# Patient Record
Sex: Female | Born: 1960
Health system: Southern US, Community
[De-identification: ages and names within clinical notes are randomized; demographics above are authoritative.]

## PROBLEM LIST (undated history)

## (undated) DIAGNOSIS — K219 Gastro-esophageal reflux disease without esophagitis: Secondary | ICD-10-CM

## (undated) DIAGNOSIS — R0602 Shortness of breath: Secondary | ICD-10-CM

## (undated) DIAGNOSIS — R9431 Abnormal electrocardiogram [ECG] [EKG]: Secondary | ICD-10-CM

## (undated) DIAGNOSIS — M810 Age-related osteoporosis without current pathological fracture: Secondary | ICD-10-CM

## (undated) DIAGNOSIS — E288 Other ovarian dysfunction: Secondary | ICD-10-CM

## (undated) DIAGNOSIS — E2839 Other primary ovarian failure: Secondary | ICD-10-CM

## (undated) DIAGNOSIS — R5383 Other fatigue: Secondary | ICD-10-CM

## (undated) DIAGNOSIS — R87611 Atypical squamous cells cannot exclude high grade squamous intraepithelial lesion on cytologic smear of cervix (ASC-H): Secondary | ICD-10-CM

## (undated) HISTORY — DX: Other primary ovarian failure: E28.39

## (undated) HISTORY — DX: Age-related osteoporosis without current pathological fracture: M81.0

## (undated) HISTORY — DX: Gastro-esophageal reflux disease without esophagitis: K21.9

## (undated) HISTORY — DX: Other ovarian dysfunction: E28.8

## (undated) HISTORY — PX: TUBAL LIGATION: SHX77

## (undated) HISTORY — PX: OTHER SURGICAL HISTORY: SHX169

---

## 2006-01-22 ENCOUNTER — Other Ambulatory Visit: Admission: RE | Admit: 2006-01-22 | Discharge: 2006-01-22 | Payer: Self-pay | Admitting: Obstetrics and Gynecology

## 2006-01-31 ENCOUNTER — Ambulatory Visit (HOSPITAL_COMMUNITY): Admission: RE | Admit: 2006-01-31 | Discharge: 2006-01-31 | Payer: Self-pay | Admitting: Obstetrics and Gynecology

## 2006-06-28 ENCOUNTER — Emergency Department (HOSPITAL_COMMUNITY): Admission: EM | Admit: 2006-06-28 | Discharge: 2006-06-28 | Payer: Self-pay | Admitting: Emergency Medicine

## 2007-04-26 ENCOUNTER — Other Ambulatory Visit: Admission: RE | Admit: 2007-04-26 | Discharge: 2007-04-26 | Payer: Self-pay | Admitting: Obstetrics and Gynecology

## 2007-05-26 ENCOUNTER — Emergency Department (HOSPITAL_COMMUNITY): Admission: EM | Admit: 2007-05-26 | Discharge: 2007-05-26 | Payer: Self-pay | Admitting: Emergency Medicine

## 2007-07-04 ENCOUNTER — Ambulatory Visit (HOSPITAL_COMMUNITY): Admission: RE | Admit: 2007-07-04 | Discharge: 2007-07-04 | Payer: Self-pay | Admitting: Obstetrics and Gynecology

## 2008-04-27 ENCOUNTER — Other Ambulatory Visit: Admission: RE | Admit: 2008-04-27 | Discharge: 2008-04-27 | Payer: Self-pay | Admitting: Obstetrics and Gynecology

## 2009-04-28 ENCOUNTER — Ambulatory Visit (HOSPITAL_COMMUNITY): Admission: RE | Admit: 2009-04-28 | Discharge: 2009-04-28 | Payer: Self-pay | Admitting: Obstetrics and Gynecology

## 2009-04-28 ENCOUNTER — Encounter: Payer: Self-pay | Admitting: Obstetrics and Gynecology

## 2009-04-28 ENCOUNTER — Other Ambulatory Visit: Admission: RE | Admit: 2009-04-28 | Discharge: 2009-04-28 | Payer: Self-pay | Admitting: Obstetrics and Gynecology

## 2009-04-28 ENCOUNTER — Ambulatory Visit: Payer: Self-pay | Admitting: Obstetrics and Gynecology

## 2009-05-06 ENCOUNTER — Ambulatory Visit: Payer: Self-pay | Admitting: Obstetrics and Gynecology

## 2009-09-01 ENCOUNTER — Ambulatory Visit: Payer: Self-pay | Admitting: Obstetrics and Gynecology

## 2010-05-05 ENCOUNTER — Ambulatory Visit (HOSPITAL_COMMUNITY): Admission: RE | Admit: 2010-05-05 | Discharge: 2010-05-05 | Payer: Self-pay | Admitting: Obstetrics and Gynecology

## 2010-05-12 ENCOUNTER — Other Ambulatory Visit: Admission: RE | Admit: 2010-05-12 | Discharge: 2010-05-12 | Payer: Self-pay | Admitting: Obstetrics and Gynecology

## 2010-05-12 ENCOUNTER — Ambulatory Visit: Payer: Self-pay | Admitting: Obstetrics and Gynecology

## 2010-06-23 DIAGNOSIS — E782 Mixed hyperlipidemia: Secondary | ICD-10-CM | POA: Insufficient documentation

## 2010-06-23 DIAGNOSIS — R12 Heartburn: Secondary | ICD-10-CM | POA: Insufficient documentation

## 2010-06-23 DIAGNOSIS — E28319 Asymptomatic premature menopause: Secondary | ICD-10-CM | POA: Insufficient documentation

## 2010-06-23 DIAGNOSIS — R635 Abnormal weight gain: Secondary | ICD-10-CM | POA: Insufficient documentation

## 2010-06-23 DIAGNOSIS — E785 Hyperlipidemia, unspecified: Secondary | ICD-10-CM | POA: Insufficient documentation

## 2010-08-30 ENCOUNTER — Ambulatory Visit: Payer: Self-pay | Admitting: Cardiology

## 2010-11-21 ENCOUNTER — Encounter: Payer: Self-pay | Admitting: Obstetrics and Gynecology

## 2010-11-29 NOTE — Assessment & Plan Note (Signed)
Summary: np6/ lipid management. pt has state bcbs/ gd   Visit Type:  new pt visit Referring Provider:  Carmelina Peal Primary Provider:  Carmelina Peal  CC:  pt her for lipid management...sob when she runs up the steps...denies any cp or edema.  History of Present Illness: Ms Sabrina Gallagher comes in today for management of her lipids.  Her last cholesterol was 273 up from 220. Her HDL and increased also from 53-66 giving her the same cholesterol to HDL ratio. This was calculated at 4.1. Her LDL went from 157 to 163and her triglycerides increased at 219 from 52. During this time she gained about 15 pounds. She is very sedentary.  She has no other conventional risk factors. Her she's having no symptoms of chest pain, shortness of breath, or angina. She denies orthopnea PND or edema.  She simply states that she likes to eat unhealthy food such as pizza.  Current Medications (verified): 1)  None  Allergies (verified): No Known Drug Allergies  Past History:  Past Medical History: Last updated: 06/23/2010 Premature ovarian failure Weight gain Heartburn  Family History: Last updated: 08/30/2010 as per pt states there is no family h/o any health problems  Social History: Last updated: 08/30/2010 Regular Exercise - yes...3 times weekly @ [employer] Tobacco Use - No.  Alcohol Use - no  Risk Factors: Exercise: yes (06/23/2010)  Risk Factors: Smoking Status: never (06/23/2010)  Past Surgical History: pt has had no surgeries  Family History: as per pt states there is no family h/o any health problems  Social History: Regular Exercise - yes...3 times weekly @ [employer] Tobacco Use - No.  Alcohol Use - no  Review of Systems       negative other than history of present illness  Vital Signs:  Patient profile:   50 year old female Height:      66 inches Weight:      184.4 pounds BMI:     29.87 Pulse rate:   72 / minute Pulse rhythm:   irregular BP sitting:   110 / 84  (left  arm) Cuff size:   large  Vitals Entered By: Danielle Rankin, CMA (August 30, 2010 4:16 PM)  Physical Exam  General:  obese.  no acute distress Head:  normocephalic and atraumatic Eyes:  PERRLA/EOM intact; conjunctiva and lids normal. Neck:  Neck supple, no JVD. No masses, thyromegaly or abnormal cervical nodes. Chest Vinicius Brockman:  no deformities or breast masses noted Lungs:  Clear bilaterally to auscultation and percussion. Heart:  PMI nondisplaced, regular rate and rhythm, normal S1-S2, no murmur gallop or bruit Abdomen:  Bowel sounds positive; abdomen soft and non-tender without masses, organomegaly, or hernias noted. No hepatosplenomegaly. Msk:  Back normal, normal gait. Muscle strength and tone normal. Pulses:  pulses normal in all 4 extremities Extremities:  No clubbing or cyanosis. Neurologic:  Alert and oriented x 3. Skin:  Intact without lesions or rashes. Psych:  Normal affect.   EKG  Procedure date:  08/30/2010  Findings:      normal sinus rhythm, nonspecific T changes.  Impression & Recommendations:  Problem # 1:  HYPERLIPIDEMIA-MIXED (ICD-272.4) Assessment Deteriorated The change in her numbers for the worse clearly reflect weight gain. In particular concerned about her triglycerides which can be a precursor to diabetes. I strongly advised to lose 15-20 pounds and to exercise her hours per week. She will then have lipids checked at that time. Without any other risk factors, she is not in need of pharmacological therapy.  I will see her back p.r.n.  Problem # 2:  WEIGHT GAIN (ICD-783.1) Assessment: New  Patient Instructions: 1)  Your physician encouraged you to lose weight for better health.--please attempt to loose 3 pounds per week--please walk everyday

## 2011-01-29 ENCOUNTER — Inpatient Hospital Stay (INDEPENDENT_AMBULATORY_CARE_PROVIDER_SITE_OTHER)
Admission: RE | Admit: 2011-01-29 | Discharge: 2011-01-29 | Disposition: A | Discharge status: Home / Self Care | Payer: 59 | Source: Ambulatory Visit | Attending: Family Medicine | Admitting: Family Medicine

## 2011-01-29 DIAGNOSIS — M799 Soft tissue disorder, unspecified: Secondary | ICD-10-CM

## 2011-04-18 ENCOUNTER — Other Ambulatory Visit: Payer: Self-pay | Admitting: Obstetrics and Gynecology

## 2011-04-18 DIAGNOSIS — Z1231 Encounter for screening mammogram for malignant neoplasm of breast: Secondary | ICD-10-CM

## 2011-05-09 ENCOUNTER — Ambulatory Visit (HOSPITAL_COMMUNITY): Payer: 59

## 2011-05-12 ENCOUNTER — Ambulatory Visit (HOSPITAL_COMMUNITY): Payer: 59

## 2011-05-16 ENCOUNTER — Ambulatory Visit (HOSPITAL_COMMUNITY)
Admission: RE | Admit: 2011-05-16 | Discharge: 2011-05-16 | Disposition: A | Discharge status: Home / Self Care | Payer: 59 | Source: Ambulatory Visit | Attending: Obstetrics and Gynecology | Admitting: Obstetrics and Gynecology

## 2011-05-16 DIAGNOSIS — Z1231 Encounter for screening mammogram for malignant neoplasm of breast: Secondary | ICD-10-CM | POA: Insufficient documentation

## 2011-08-04 ENCOUNTER — Inpatient Hospital Stay (INDEPENDENT_AMBULATORY_CARE_PROVIDER_SITE_OTHER)
Admission: RE | Admit: 2011-08-04 | Discharge: 2011-08-04 | Disposition: A | Discharge status: Home / Self Care | Payer: 59 | Source: Ambulatory Visit | Attending: Emergency Medicine | Admitting: Emergency Medicine

## 2011-08-04 DIAGNOSIS — J029 Acute pharyngitis, unspecified: Secondary | ICD-10-CM

## 2011-08-04 DIAGNOSIS — R198 Other specified symptoms and signs involving the digestive system and abdomen: Secondary | ICD-10-CM

## 2011-08-14 LAB — POCT CARDIAC MARKERS
Myoglobin, poc: 39.5
Operator id: 192351
Operator id: 192351
Troponin i, poc: <0.05

## 2011-08-14 LAB — DIFFERENTIAL
Basophils Absolute: 0
Basophils Relative: 1
Eosinophils Relative: 2
Lymphs Abs: 2.7
Monocytes Absolute: 0.6
Monocytes Relative: 9
Neutrophils Relative %: 42 — ABNORMAL LOW

## 2011-08-14 LAB — I-STAT 8, (EC8 V) (CONVERTED LAB)
Acid-Base Excess: 1
BUN: 10
Bicarbonate: 27.6 — ABNORMAL HIGH
HCT: 39
Hemoglobin: 13.3
TCO2: 29
pCO2, Ven: 48.7
pH, Ven: 7.362 — ABNORMAL HIGH

## 2011-08-14 LAB — CBC
Hemoglobin: 12
Platelets: 280
RBC: 4.23

## 2011-08-14 LAB — POCT I-STAT CREATININE: Operator id: 192351

## 2011-08-14 LAB — D-DIMER, QUANTITATIVE: D-Dimer, Quant: 0.26

## 2011-11-02 ENCOUNTER — Emergency Department (HOSPITAL_COMMUNITY)
Admission: EM | Admit: 2011-11-02 | Discharge: 2011-11-02 | Disposition: A | Discharge status: Home / Self Care | Payer: 59 | Source: Home / Self Care | Attending: Family Medicine | Admitting: Family Medicine

## 2011-11-02 DIAGNOSIS — J069 Acute upper respiratory infection, unspecified: Secondary | ICD-10-CM

## 2011-11-02 MED ORDER — IPRATROPIUM BROMIDE 0.06 % NA SOLN: 2.0000 | Freq: Four times a day (QID) | NASAL | Status: DC

## 2011-11-02 MED ORDER — AZITHROMYCIN 250 MG PO TABS: ORAL_TABLET | ORAL | Status: AC

## 2011-11-02 MED ORDER — PSEUDOEPHEDRINE-GUAIFENESIN ER 120-1200 MG PO TB12: 120.0000 mg | ORAL_TABLET | Freq: Two times a day (BID) | ORAL | Status: DC

## 2011-11-02 NOTE — ED Provider Notes (Signed)
History     CSN: [id number]  Arrival date & time 11/02/11  1758   First MD Initiated Contact with Patient 11/02/11 1812      No chief complaint on file.   (Consider location/radiation/quality/duration/timing/severity/associated sxs/prior treatment) Patient is a 51 y.o. female presenting with URI. The history is provided by the patient.  URI The primary symptoms include ear pain, sore throat and cough. Primary symptoms do not include fever, nausea or vomiting. The current episode started 3 to 5 days ago. This is a new problem. The problem has not changed since onset. Symptoms associated with the illness include sinus pressure, congestion and rhinorrhea.    No past medical history on file.  No past surgical history on file.  No family history on file.  History  Substance Use Topics  . Smoking status: Not on file  . Smokeless tobacco: Not on file  . Alcohol Use: Not on file    OB History    No data available      Review of Systems  Constitutional: Negative for fever.  HENT: Positive for ear pain, congestion, sore throat, rhinorrhea and sinus pressure.   Respiratory: Positive for cough.   Gastrointestinal: Negative for nausea and vomiting.    Allergies  Review of patient's allergies indicates not on file.  Home Medications   Current Outpatient Rx  Name Route Sig Dispense Refill  . AZITHROMYCIN 250 MG PO TABS  Take as directed on pack 6 each 0  . IPRATROPIUM BROMIDE 0.06 % NA SOLN Nasal Place 2 sprays into the nose 4 (four) times daily. 15 mL 12  . PSEUDOEPHEDRINE-GUAIFENESIN ER [phone] MG PO TB12 Oral Take 120-1,200 mg by mouth 2 (two) times daily. 30 each 0    BP 146/82  Pulse 89  Temp(Src) 99.2 F (37.3 C) (Oral)  Resp 16  SpO2 100%  Physical Exam  Nursing note and vitals reviewed. Constitutional: She is oriented to person, place, and time. She appears well-developed and well-nourished.  HENT:  Head: Normocephalic.  Right Ear: External ear normal.    Left Ear: External ear normal.  Mouth/Throat: Oropharynx is clear and moist.  Eyes: Conjunctivae are normal. Pupils are equal, round, and reactive to light.  Neck: Normal range of motion. Neck supple.  Cardiovascular: Normal rate, regular rhythm, normal heart sounds and intact distal pulses.   Pulmonary/Chest: Effort normal and breath sounds normal.  Lymphadenopathy:    She has no cervical adenopathy.  Neurological: She is alert and oriented to person, place, and time.  Skin: Skin is warm and dry.    ED Course  Procedures (including critical care time)  Labs Reviewed - No data to display No results found.   1. URI (upper respiratory infection)       MDM          Barkley Bruns, MD 11/02/11 2000

## 2011-11-04 ENCOUNTER — Encounter: Payer: Self-pay | Admitting: Emergency Medicine

## 2012-05-16 ENCOUNTER — Other Ambulatory Visit: Payer: Self-pay | Admitting: Obstetrics and Gynecology

## 2012-05-16 DIAGNOSIS — Z1231 Encounter for screening mammogram for malignant neoplasm of breast: Secondary | ICD-10-CM

## 2012-05-27 ENCOUNTER — Emergency Department (HOSPITAL_COMMUNITY): Payer: 59

## 2012-05-27 ENCOUNTER — Encounter (HOSPITAL_COMMUNITY): Payer: Self-pay | Admitting: Emergency Medicine

## 2012-05-27 ENCOUNTER — Emergency Department (HOSPITAL_COMMUNITY)
Admission: EM | Admit: 2012-05-27 | Discharge: 2012-05-27 | Disposition: A | Discharge status: Home / Self Care | Payer: 59 | Attending: Emergency Medicine | Admitting: Emergency Medicine

## 2012-05-27 DIAGNOSIS — R0602 Shortness of breath: Secondary | ICD-10-CM | POA: Insufficient documentation

## 2012-05-27 DIAGNOSIS — K219 Gastro-esophageal reflux disease without esophagitis: Secondary | ICD-10-CM | POA: Insufficient documentation

## 2012-05-27 DIAGNOSIS — R0789 Other chest pain: Secondary | ICD-10-CM

## 2012-05-27 LAB — POCT I-STAT TROPONIN I

## 2012-05-27 LAB — BASIC METABOLIC PANEL
BUN: 10 mg/dL (ref 6–23)
CO2: 24 mEq/L (ref 19–32)
Calcium: 9.3 mg/dL (ref 8.4–10.5)
Creatinine, Ser: 0.65 mg/dL (ref 0.50–1.10)
Glucose, Bld: 136 mg/dL — ABNORMAL HIGH (ref 70–99)
Potassium: 3.6 mEq/L (ref 3.5–5.1)
Sodium: 141 mEq/L (ref 135–145)

## 2012-05-27 LAB — CBC
Platelets: 268 10*3/uL (ref 150–400)
RDW: 13.8 % (ref 11.5–15.5)

## 2012-05-27 LAB — HEPATIC FUNCTION PANEL
Alkaline Phosphatase: 74 U/L (ref 39–117)
Bilirubin, Direct: <0.1 mg/dL (ref 0.0–0.3)
Total Bilirubin: 0.1 mg/dL — ABNORMAL LOW (ref 0.3–1.2)
Total Protein: 7.6 g/dL (ref 6.0–8.3)

## 2012-05-27 LAB — D-DIMER, QUANTITATIVE: D-Dimer, Quant: 0.28 ug/mL-FEU (ref 0.00–0.48)

## 2012-05-27 MED ORDER — LANSOPRAZOLE 30 MG PO CPDR: 30.0000 mg | DELAYED_RELEASE_CAPSULE | Freq: Every day | ORAL | Status: DC

## 2012-05-27 MED ORDER — GI COCKTAIL ~~LOC~~
30.0000 mL | Freq: Once | ORAL | Status: AC
  Administered 2012-05-27: 30 mL via ORAL
  Filled 2012-05-27: qty 30

## 2012-05-27 NOTE — ED Notes (Signed)
Pt states she woke up gagging approx 30 min ago.  C/o pain to center of chest with sob, dizziness, diaphoresis, and nausea.

## 2012-05-27 NOTE — ED Notes (Signed)
Old and new EKG given to DR [facility]

## 2012-05-28 NOTE — ED Provider Notes (Signed)
History     CSN: [id number]  Arrival date & time 05/27/12  0205   First MD Initiated Contact with Patient 05/27/12 0405      Chief Complaint  Patient presents with  . Chest Pain  . Shortness of Breath    (Consider location/radiation/quality/duration/timing/severity/associated sxs/prior treatment) HPI 51 year old female presents emergency department with complaint of waking gagging on saliva, shortness of breath and pressure/burning pain to the center of her chest. Patient reports she had nausea with this. She denies previous history of GERD, reflux heartburn, PND orthopnea. Patient denies any medical problems, no family history of coronary disease. Patient felt well before going to bed. Patient describes tightness and as a band across her chest and up the center. Patient took some peppermint tea which has not helped her symptoms. She denies any leg swelling, no history of blood clots, not on Birth control  History reviewed. No pertinent past medical history.  History reviewed. No pertinent past surgical history.  No family history on file.  History  Substance Use Topics  . Smoking status: Never Smoker   . Smokeless tobacco: Not on file  . Alcohol Use: No    OB History    Grav Para Term Preterm Abortions TAB SAB Ect Mult Living                  Review of Systems  All other systems reviewed and are negative.   other listed in history of present illness  Allergies  Review of patient's allergies indicates no known allergies.  Home Medications   Current Outpatient Rx  Name Route Sig Dispense Refill  . LANSOPRAZOLE 30 MG PO CPDR Oral Take 1 capsule (30 mg total) by mouth daily. 30 capsule 0    BP 116/64  Pulse 79  Temp 98.3 F (36.8 C) (Oral)  Resp 18  SpO2 99%  Physical Exam  Nursing note and vitals reviewed. Constitutional: She is oriented to person, place, and time. She appears well-developed and well-nourished.  HENT:  Head: Normocephalic and atraumatic.    Right Ear: External ear normal.  Left Ear: External ear normal.  Nose: Nose normal.  Mouth/Throat: Oropharynx is clear and moist.  Eyes: Conjunctivae and EOM are normal. Pupils are equal, round, and reactive to light.  Neck: Normal range of motion. Neck supple. No JVD present. No tracheal deviation present. No thyromegaly present.  Cardiovascular: Normal rate, regular rhythm, normal heart sounds and intact distal pulses.  Exam reveals no gallop and no friction rub.   No murmur heard. Pulmonary/Chest: Effort normal and breath sounds normal. No stridor. No respiratory distress. She has no wheezes. She has no rales. She exhibits no tenderness.  Abdominal: Soft. Bowel sounds are normal. She exhibits no distension and no mass. There is no tenderness. There is no rebound and no guarding.  Musculoskeletal: Normal range of motion. She exhibits no edema and no tenderness.  Lymphadenopathy:    She has no cervical adenopathy.  Neurological: She is oriented to person, place, and time. She has normal reflexes. No cranial nerve deficit. She exhibits normal muscle tone. Coordination normal.  Skin: Skin is dry. No rash noted. No erythema. No pallor.  Psychiatric: She has a normal mood and affect. Her behavior is normal. Judgment and thought content normal.    ED Course  Procedures (including critical care time)  Labs Reviewed  BASIC METABOLIC PANEL - Abnormal; Notable for the following:    Glucose, Bld 136 (*)     All other components within  normal limits  HEPATIC FUNCTION PANEL - Abnormal; Notable for the following:    Total Bilirubin 0.1 (*)     All other components within normal limits  CARDIAC PANEL(CRET KIN+CKTOT+MB+TROPI) - Abnormal; Notable for the following:    Total CK 210 (*)     All other components within normal limits  CBC  POCT I-STAT TROPONIN I  LIPASE, BLOOD  D-DIMER, QUANTITATIVE   Dg Chest 2 View  05/27/2012  *RADIOLOGY REPORT*  Clinical Data: Chest pain, shortness of breath.   CHEST - 2 VIEW  Comparison: 05/26/2007  Findings: Cardiomediastinal contours are stable, within normal range.  Lungs are essentially clear.  No focal consolidation, pleural effusion, or pneumothorax.  No acute osseous finding.  IMPRESSION: No radiographic evidence of acute cardiopulmonary process.  Original Report Authenticated By: Waneta Martins, M.D.    Date: 05/27/2012  Rate: 72  Rhythm: normal sinus rhythm  QRS Axis: normal  Intervals: normal  ST/T Wave abnormalities: nonspecific T wave changes  Conduction Disutrbances:none  Narrative Interpretation:   Old EKG Reviewed: none available   1. Atypical chest pain   2. GERD (gastroesophageal reflux disease)       MDM  29-year-old female with atypical chest pain. Complete resolution after GI cocktail, EKG with some T-wave flattening otherwise unremarkable very slight elevation in total CK but negative CK and troponin as well as d-dimer. Will start the patient on PPI and have her followup with private care provider        Olivia Mackie, MD 05/28/12 [phone]

## 2012-05-31 ENCOUNTER — Encounter: Payer: Self-pay | Admitting: Gynecology

## 2012-05-31 DIAGNOSIS — E2839 Other primary ovarian failure: Secondary | ICD-10-CM | POA: Insufficient documentation

## 2012-06-06 ENCOUNTER — Ambulatory Visit (HOSPITAL_COMMUNITY)
Admission: RE | Admit: 2012-06-06 | Discharge: 2012-06-06 | Disposition: A | Discharge status: Home / Self Care | Payer: 59 | Source: Ambulatory Visit | Attending: Obstetrics and Gynecology | Admitting: Obstetrics and Gynecology

## 2012-06-06 DIAGNOSIS — Z1231 Encounter for screening mammogram for malignant neoplasm of breast: Secondary | ICD-10-CM | POA: Insufficient documentation

## 2012-06-11 ENCOUNTER — Encounter: Payer: Self-pay | Admitting: Obstetrics and Gynecology

## 2012-06-11 ENCOUNTER — Ambulatory Visit (INDEPENDENT_AMBULATORY_CARE_PROVIDER_SITE_OTHER): Payer: 59 | Admitting: Obstetrics and Gynecology

## 2012-06-11 ENCOUNTER — Other Ambulatory Visit (HOSPITAL_COMMUNITY)
Admission: RE | Admit: 2012-06-11 | Discharge: 2012-06-11 | Disposition: A | Discharge status: Home / Self Care | Payer: 59 | Source: Ambulatory Visit | Attending: Obstetrics and Gynecology | Admitting: Obstetrics and Gynecology

## 2012-06-11 VITALS — BP 124/80 | Ht 64.0 in | Wt 192.0 lb

## 2012-06-11 DIAGNOSIS — Z01419 Encounter for gynecological examination (general) (routine) without abnormal findings: Secondary | ICD-10-CM

## 2012-06-11 DIAGNOSIS — IMO0001 Reserved for inherently not codable concepts without codable children: Secondary | ICD-10-CM | POA: Insufficient documentation

## 2012-06-11 LAB — CBC WITH DIFFERENTIAL/PLATELET
Basophils Relative: 1 % (ref 0–1)
Eosinophils Absolute: 0.1 10*3/uL (ref 0.0–0.7)
HCT: 38.4 % (ref 36.0–46.0)
Hemoglobin: 12.3 g/dL (ref 12.0–15.0)
Lymphs Abs: 2.9 10*3/uL (ref 0.7–4.0)
MCH: 27.3 pg (ref 26.0–34.0)
MCHC: 32 g/dL (ref 30.0–36.0)
MCV: 85.3 fL (ref 78.0–100.0)
Monocytes Absolute: 0.4 10*3/uL (ref 0.1–1.0)
Monocytes Relative: 7 % (ref 3–12)

## 2012-06-11 NOTE — Progress Notes (Signed)
Patient came to see me today for her annual GYN exam. She is gone through premature ovarian failure. She is having no vaginal bleeding or pelvic pain. She is up-to-date on mammograms. She has always had normal Pap smears. Her last Pap smear was 2011. She's never had a bone density. She has lost 2 inches of height over a period of several years. She has had no fractures. She is not currently sexually active. She is looking for a PCP. She's never had a colonoscopy and her father had colon cancer.she is doing well menopausally without HRT.  Physical examination:kim gardner  present. HEENT within normal limits. Neck: Thyroid not large. No masses. Supraclavicular nodes: not enlarged. Breasts: Examined in both sitting and lying  position. No skin changes and no masses. Abdomen: Soft no guarding rebound or masses or hernia. Pelvic: External: Within normal limits. BUS: Within normal limits. Vaginal:within normal limits. Good estrogen effect. No evidence of cystocele rectocele or enterocele. Cervix: clean. Uterus: Normal size and shape. Adnexa: No masses. Rectovaginal exam: Confirmatory and negative. Extremities: Within normal limits.  Assessment: #1. Premature ovary failure #2. Height loss  Plan: Continue yearly mammograms. Schedule bone density. Schedule colonoscopy. The new Pap smear guidelines were discussed with the patient. Pap done.

## 2012-06-11 NOTE — Patient Instructions (Signed)
Schedule bone density. Schedule colonoscopy with Dr. Marina Goodell.

## 2012-06-12 ENCOUNTER — Other Ambulatory Visit: Payer: Self-pay | Admitting: Obstetrics and Gynecology

## 2012-06-12 DIAGNOSIS — E7800 Pure hypercholesterolemia, unspecified: Secondary | ICD-10-CM

## 2012-06-12 LAB — URINALYSIS W MICROSCOPIC + REFLEX CULTURE
Bilirubin Urine: NEGATIVE
Ketones, ur: NEGATIVE mg/dL
Leukocytes, UA: NEGATIVE
Specific Gravity, Urine: <1.005 — ABNORMAL LOW (ref 1.005–1.030)
pH: 6.5 (ref 5.0–8.0)

## 2012-07-04 ENCOUNTER — Other Ambulatory Visit: Payer: 59

## 2012-07-04 DIAGNOSIS — E7800 Pure hypercholesterolemia, unspecified: Secondary | ICD-10-CM

## 2012-07-04 LAB — LIPID PANEL
Cholesterol: 217 mg/dL — ABNORMAL HIGH (ref 0–200)
Triglycerides: 89 mg/dL (ref <150)
VLDL: 18 mg/dL (ref 0–40)

## 2012-07-08 ENCOUNTER — Other Ambulatory Visit: Payer: Self-pay | Admitting: Obstetrics and Gynecology

## 2012-07-08 DIAGNOSIS — E7800 Pure hypercholesterolemia, unspecified: Secondary | ICD-10-CM

## 2012-10-11 ENCOUNTER — Encounter (HOSPITAL_COMMUNITY): Payer: Self-pay | Admitting: Emergency Medicine

## 2012-10-11 ENCOUNTER — Emergency Department (HOSPITAL_COMMUNITY)
Admission: EM | Admit: 2012-10-11 | Discharge: 2012-10-11 | Disposition: A | Discharge status: Home / Self Care | Payer: 59 | Source: Home / Self Care

## 2012-10-11 DIAGNOSIS — K219 Gastro-esophageal reflux disease without esophagitis: Secondary | ICD-10-CM

## 2012-10-11 DIAGNOSIS — K209 Esophagitis, unspecified without bleeding: Secondary | ICD-10-CM

## 2012-10-11 NOTE — ED Provider Notes (Signed)
History     CSN: [id number]  Arrival date & time 10/11/12  1003   First MD Initiated Contact with Patient 10/11/12 1019      Chief Complaint  Patient presents with  . Gastrophageal Reflux    (Consider location/radiation/quality/duration/timing/severity/associated sxs/prior treatment) HPI Comments: History is from the patient. She has a chief complaint that 2 feels like it stuck in her esophagus. There is discomfort from her throat down to the epigastrium which began abruptly 3 days ago although she has a remote history of GERD. She states that sometimes there is a problem with swallowing but she is able to get foods and liquids down. She is able to hold her own  secretions without regurgitation or vomiting. She describes belching and a discomfort in the epigastrium that often radiates upward into the throat. There has been no vomiting or inability to swallow but there has been some discomfort with swallowing food.   Past Medical History  Diagnosis Date  . Premature ovarian failure   . Reflux     History reviewed. No pertinent past surgical history.  Family History  Problem Relation Age of Onset  . Colon cancer Father     History  Substance Use Topics  . Smoking status: Never Smoker   . Smokeless tobacco: Not on file  . Alcohol Use: No    OB History    Grav Para Term Preterm Abortions TAB SAB Ect Mult Living   3 2 2  1     2       Review of Systems  Constitutional: Negative.   HENT: Negative.   Respiratory: Negative.   Cardiovascular: Negative.   Gastrointestinal:       See history of present illness  Genitourinary: Negative.   Neurological: Negative.   Psychiatric/Behavioral: Negative.   All other systems reviewed and are negative.    Allergies  Review of patient's allergies indicates no known allergies.  Home Medications   Current Outpatient Rx  Name  Route  Sig  Dispense  Refill  . LANSOPRAZOLE 30 MG PO CPDR   Oral   Take 1 capsule (30 mg total) by  mouth daily.   30 capsule   0     BP 139/86  Pulse 73  Temp 97.2 F (36.2 C) (Oral)  Resp 18  SpO2 95%  Physical Exam  Constitutional: She is oriented to person, place, and time. She appears well-developed and well-nourished. No distress.  HENT:  Head: Normocephalic and atraumatic.  Mouth/Throat: Oropharynx is clear and moist. No oropharyngeal exudate.  Eyes: EOM are normal. Pupils are equal, round, and reactive to light.  Neck: Normal range of motion. Neck supple.  Cardiovascular: Normal rate and normal heart sounds.   Pulmonary/Chest: Effort normal and breath sounds normal. No respiratory distress.  Abdominal: Soft. She exhibits no mass. There is no tenderness. There is no rebound and no guarding.  Musculoskeletal: Normal range of motion.  Neurological: She is alert and oriented to person, place, and time. No cranial nerve deficit.  Skin: Skin is warm and dry.  Psychiatric: She has a normal mood and affect.    ED Course  Procedures (including critical care time)  Labs Reviewed - No data to display No results found.   1. GERD (gastroesophageal reflux disease)   2. Esophagitis       MDM  Prevacid 30 mg daily. She has this at home Soft mechanical diet and avoid acidic, spicy and rough-type foods. Eats small amounts at a time and to  well. Instructions on diet for GERD given. Followup with your PCP and will probably need a referral to a gastroenterologist. For any inability to swallow or increased pain in any to go to the emergency department.         Hayden Rasmussen, NP 10/11/12 1041

## 2012-10-11 NOTE — ED Notes (Signed)
Pt c/o indigestion problems x2 days... Feels like her food is still in the esophagus... Was Rx acid reflux medication but not taking it... Will occasionally have heartburns and frequent burping... She is alert w/no signs of acute distress.

## 2012-10-12 NOTE — ED Provider Notes (Signed)
Medical screening examination/treatment/procedure(s) were performed by non-physician practitioner and as supervising physician I was immediately available for consultation/collaboration.   MORENO-COLL,Dossie Ocanas; MD   Ahmad Vanwey Moreno-Coll, MD 10/12/12 0822 

## 2013-05-26 ENCOUNTER — Other Ambulatory Visit: Payer: Self-pay | Admitting: Gynecology

## 2013-05-26 DIAGNOSIS — Z1231 Encounter for screening mammogram for malignant neoplasm of breast: Secondary | ICD-10-CM

## 2013-06-19 ENCOUNTER — Other Ambulatory Visit: Payer: Self-pay | Admitting: Gynecology

## 2013-06-19 ENCOUNTER — Ambulatory Visit (HOSPITAL_COMMUNITY)
Admission: RE | Admit: 2013-06-19 | Discharge: 2013-06-19 | Disposition: A | Discharge status: Home / Self Care | Payer: 59 | Source: Ambulatory Visit | Attending: Gynecology | Admitting: Gynecology

## 2013-06-19 DIAGNOSIS — Z1231 Encounter for screening mammogram for malignant neoplasm of breast: Secondary | ICD-10-CM | POA: Insufficient documentation

## 2013-06-26 ENCOUNTER — Ambulatory Visit (INDEPENDENT_AMBULATORY_CARE_PROVIDER_SITE_OTHER): Payer: 59 | Admitting: Gynecology

## 2013-06-26 ENCOUNTER — Encounter: Payer: Self-pay | Admitting: Gynecology

## 2013-06-26 VITALS — BP 120/76 | Ht 65.0 in | Wt 200.0 lb

## 2013-06-26 DIAGNOSIS — Z13220 Encounter for screening for lipoid disorders: Secondary | ICD-10-CM

## 2013-06-26 DIAGNOSIS — Z01419 Encounter for gynecological examination (general) (routine) without abnormal findings: Secondary | ICD-10-CM

## 2013-06-26 DIAGNOSIS — E28319 Asymptomatic premature menopause: Secondary | ICD-10-CM

## 2013-06-26 NOTE — Progress Notes (Signed)
Sabrina Gallagher [birth year] [id number]        52 y.o.  [id number] for annual exam.  Doing well, former patient of Dr. Eda Paschal.  Past medical history,surgical history, medications, allergies, family history and social history were all reviewed and documented in the EPIC chart.  ROS:  Performed and pertinent positives and negatives are included in the history, assessment and plan .  Exam: Kim assistant Filed Vitals:   06/26/13 1604  BP: 120/76  Height: 5\' 5"  (1.651 m)  Weight: 200 lb (90.719 kg)   General appearance  Normal Skin grossly normal Head/Neck normal with no cervical or supraclavicular adenopathy thyroid normal Lungs  clear Cardiac RR, without RMG Abdominal  soft, nontender, without masses, organomegaly or hernia Breasts  examined lying and sitting without masses, retractions, discharge or axillary adenopathy. Pelvic  Ext/BUS/vagina  with first degree cystocele. Uterus well supported. No evidence of rectocele.  Cervix  normal  Uterus  anteverted, normal size, shape and contour, midline and mobile nontender   Adnexa  Without masses or tenderness    Anus and perineum  normal   Rectovaginal  normal sphincter tone without palpated masses or tenderness.    Assessment/Plan:  52 y.o. [id number] female for annual exam.   1. Premature menopause age 59. No bleeding. No significant hot flushes, night sweats, vaginal dryness or dyspareunia. Had transiently tried HRT but could not tolerate. Recommend baseline DEXA now given the number of years of hypoestrogenism. Increase calcium and vitamin D also reviewed. Patient knows to report any bleeding. 2. Pap smear 05/2012. No Pap smear done today. No history of abnormal Pap smears previously. Plan repeat Pap smear in 3 year interval. 3. Mammography 05/2013. Continue with annual mammography. SBE monthly reviewed. 4. Colonoscopy never. Strongly recommended she schedule colonoscopy. Father with history of colon cancer. Patient agrees to  schedule. 5. Health maintenance. Baseline CBC comprehensive metabolic panel lipid profile urinalysis TSH vitamin D ordered. Follow up for bone density otherwise annually.  Note: This document was prepared with digital dictation and possible smart phrase technology. Any transcriptional errors that result from this process are unintentional.   Dara Lords MD, 4:32 PM 06/26/2013

## 2013-06-26 NOTE — Patient Instructions (Addendum)
Schedule colonoscopy with [place] at [phone] or [facility] at [phone]  Schedule bone density and followup for this.  Followup for annual exam in one year.

## 2013-06-27 LAB — LIPID PANEL
HDL: 56 mg/dL (ref >39)
LDL Cholesterol: 153 mg/dL — ABNORMAL HIGH (ref 0–99)
Triglycerides: 99 mg/dL (ref <150)
VLDL: 20 mg/dL (ref 0–40)

## 2013-06-27 LAB — COMPREHENSIVE METABOLIC PANEL
Alkaline Phosphatase: 78 U/L (ref 39–117)
BUN: 7 mg/dL (ref 6–23)
Glucose, Bld: 84 mg/dL (ref 70–99)
Total Bilirubin: 0.5 mg/dL (ref 0.3–1.2)

## 2013-06-27 LAB — CBC WITH DIFFERENTIAL/PLATELET
Basophils Relative: 1 % (ref 0–1)
Eosinophils Absolute: 0.1 10*3/uL (ref 0.0–0.7)
HCT: 37.9 % (ref 36.0–46.0)
Hemoglobin: 12.6 g/dL (ref 12.0–15.0)
MCH: 27.9 pg (ref 26.0–34.0)
MCHC: 33.2 g/dL (ref 30.0–36.0)
Monocytes Absolute: 0.4 10*3/uL (ref 0.1–1.0)
Monocytes Relative: 7 % (ref 3–12)

## 2013-06-27 LAB — URINALYSIS W MICROSCOPIC + REFLEX CULTURE
Bacteria, UA: NONE SEEN
Bilirubin Urine: NEGATIVE
Crystals: NONE SEEN
Ketones, ur: NEGATIVE mg/dL
Protein, ur: NEGATIVE mg/dL
Specific Gravity, Urine: 1.011 (ref 1.005–1.030)
Urobilinogen, UA: 0.2 mg/dL (ref 0.0–1.0)

## 2013-07-09 ENCOUNTER — Other Ambulatory Visit: Payer: Self-pay

## 2013-07-09 DIAGNOSIS — E559 Vitamin D deficiency, unspecified: Secondary | ICD-10-CM

## 2013-07-09 DIAGNOSIS — E7800 Pure hypercholesterolemia, unspecified: Secondary | ICD-10-CM

## 2013-07-30 DIAGNOSIS — M810 Age-related osteoporosis without current pathological fracture: Secondary | ICD-10-CM

## 2013-07-30 HISTORY — DX: Age-related osteoporosis without current pathological fracture: M81.0

## 2013-08-21 ENCOUNTER — Ambulatory Visit (INDEPENDENT_AMBULATORY_CARE_PROVIDER_SITE_OTHER): Payer: 59

## 2013-08-21 ENCOUNTER — Telehealth: Payer: Self-pay | Admitting: Gynecology

## 2013-08-21 DIAGNOSIS — M810 Age-related osteoporosis without current pathological fracture: Secondary | ICD-10-CM

## 2013-08-21 DIAGNOSIS — E28319 Asymptomatic premature menopause: Secondary | ICD-10-CM

## 2013-08-21 NOTE — Telephone Encounter (Signed)
Left message for pt to call.

## 2013-08-21 NOTE — Telephone Encounter (Signed)
Tell patient that her bone density does show osteoporosis. Recommend office visit to discuss. Would like her also to have vitamin D and PTH level before visit.

## 2013-08-22 ENCOUNTER — Other Ambulatory Visit: Payer: 59

## 2013-08-22 DIAGNOSIS — M810 Age-related osteoporosis without current pathological fracture: Secondary | ICD-10-CM

## 2013-08-22 NOTE — Telephone Encounter (Signed)
Pt informed with the below note, orders place, pt will make appointment.

## 2013-08-25 LAB — PTH, INTACT AND CALCIUM: Calcium: 9.5 mg/dL (ref 8.4–10.5)

## 2013-08-28 ENCOUNTER — Emergency Department (HOSPITAL_COMMUNITY)
Admission: EM | Admit: 2013-08-28 | Discharge: 2013-08-28 | Disposition: A | Discharge status: Home / Self Care | Payer: 59 | Source: Home / Self Care | Attending: Family Medicine | Admitting: Family Medicine

## 2013-08-28 ENCOUNTER — Encounter (HOSPITAL_COMMUNITY): Payer: Self-pay | Admitting: Emergency Medicine

## 2013-08-28 DIAGNOSIS — J302 Other seasonal allergic rhinitis: Secondary | ICD-10-CM

## 2013-08-28 DIAGNOSIS — J309 Allergic rhinitis, unspecified: Secondary | ICD-10-CM

## 2013-08-28 MED ORDER — IPRATROPIUM BROMIDE 0.06 % NA SOLN: 2.0000 | Freq: Four times a day (QID) | NASAL | Status: DC

## 2013-08-28 MED ORDER — METHYLPREDNISOLONE ACETATE 40 MG/ML IJ SUSP: 80.0000 mg | Freq: Once | INTRAMUSCULAR | Status: DC

## 2013-08-28 MED ORDER — HYDROCOD POLST-CHLORPHEN POLST 10-8 MG/5ML PO LQCR: 5.0000 mL | Freq: Two times a day (BID) | ORAL | Status: DC | PRN

## 2013-08-28 MED ORDER — TRIAMCINOLONE ACETONIDE 40 MG/ML IJ SUSP: 40.0000 mg | Freq: Once | INTRAMUSCULAR | Status: DC

## 2013-08-28 MED ORDER — METHYLPREDNISOLONE ACETATE 80 MG/ML IJ SUSP
INTRAMUSCULAR | Status: AC
  Filled 2013-08-28: qty 1

## 2013-08-28 MED ORDER — TRIAMCINOLONE ACETONIDE 40 MG/ML IJ SUSP
INTRAMUSCULAR | Status: AC
  Filled 2013-08-28: qty 1

## 2013-08-28 NOTE — ED Notes (Signed)
Pt  Reports  Symptoms  Of  Headache       Sinus  Congestion          Body  Aches     X  2  Days       Had  sorethroat earlier -  That is  Better  Now        She  Is  Sitting upright on  Exam table  Speaking in complete  sentances

## 2013-08-28 NOTE — ED Provider Notes (Signed)
CSN: [id number]     Arrival date & time 08/28/13  1415 History   First MD Initiated Contact with Patient 08/28/13 1626     Chief Complaint  Patient presents with  . Headache   (Consider location/radiation/quality/duration/timing/severity/associated sxs/prior Treatment) Patient is a 52 y.o. female presenting with headaches. The history is provided by the patient.  Headache Pain location:  Frontal Quality:  Dull Radiates to:  Does not radiate Onset quality:  Gradual Duration:  2 days Progression:  Unchanged Chronicity:  New Similar to prior headaches: no   Context: coughing   Associated symptoms: congestion, cough, drainage and sinus pressure   Associated symptoms: no fever     Past Medical History  Diagnosis Date  . Reflux   . Premature ovarian failure age 28   History reviewed. No pertinent past surgical history. Family History  Problem Relation Age of Onset  . Colon cancer Father    History  Substance Use Topics  . Smoking status: Never Smoker   . Smokeless tobacco: Not on file  . Alcohol Use: No   OB History   Grav Para Term Preterm Abortions TAB SAB Ect Mult Living   3 2 2  1     2      Review of Systems  Constitutional: Negative.  Negative for fever.  HENT: Positive for congestion, postnasal drip, rhinorrhea and sinus pressure.   Respiratory: Positive for cough.   Neurological: Positive for headaches.    Allergies  Review of patient's allergies indicates no known allergies.  Home Medications   Current Outpatient Rx  Name  Route  Sig  Dispense  Refill  . chlorpheniramine-HYDROcodone (TUSSIONEX PENNKINETIC ER) 10-8 MG/5ML LQCR   Oral   Take 5 mLs by mouth every 12 (twelve) hours as needed.   115 mL   1   . ipratropium (ATROVENT) 0.06 % nasal spray   Nasal   Place 2 sprays into the nose 4 (four) times daily.   15 mL   1    BP 153/88  Pulse 86  Temp(Src) 98.8 F (37.1 C) (Oral)  Resp 14  SpO2 99% Physical Exam  Nursing note and vitals  reviewed. Constitutional: She is oriented to person, place, and time. She appears well-developed and well-nourished. No distress.  HENT:  Head: Normocephalic.  Right Ear: External ear normal.  Left Ear: External ear normal.  Nose: Mucosal edema and rhinorrhea present.  Mouth/Throat: Oropharynx is clear and moist.  Neck: Normal range of motion. Neck supple.  Pulmonary/Chest: Effort normal and breath sounds normal.  Lymphadenopathy:    She has no cervical adenopathy.  Neurological: She is alert and oriented to person, place, and time.  Skin: Skin is warm and dry.    ED Course  Procedures (including critical care time) Labs Review Labs Reviewed - No data to display Imaging Review No results found.    MDM      Linna Hoff, MD 08/28/13 [phone]

## 2013-08-28 NOTE — ED Notes (Signed)
Pt  Refused   Shots   Dr Artis Flock  In   [place]  With  Pt         Pt  Still  Refused  meds  Discarded      As  They  Where  Already  Drawn  up

## 2013-08-29 ENCOUNTER — Encounter: Payer: Self-pay | Admitting: Gynecology

## 2013-08-29 ENCOUNTER — Ambulatory Visit (INDEPENDENT_AMBULATORY_CARE_PROVIDER_SITE_OTHER): Payer: 59 | Admitting: Gynecology

## 2013-08-29 DIAGNOSIS — M810 Age-related osteoporosis without current pathological fracture: Secondary | ICD-10-CM

## 2013-08-29 NOTE — Progress Notes (Signed)
Patient presents to review her most recent and only DEXA 07/2013 showing T score of -2.9 at the AP spine. Secondary workup to include TSH PTH calcium and vitamin D normal at 41. Significant history to include premature menopause at age 51 without HRT any prior vitamin D of 14 in August 2014.  I reviewed the issue of osteoporosis and fracture risk. I am sure in this patient's case is due to her premature menopause at age 72 without significant HRT. She was vitamin D deficient and now is taking 2000 units daily with a normal vitamin D level. I reviewed the risks of progression of her osteoporosis and fracture in the future. Options to include treatment now such as bisphosphonates discussed. Side effect/risk profile reviewed to include osteonecrosis of the jaw, atypical fractures, GERD, esophageal cancer. Issues of drug-free holiday also discussed. Alternative would be continue on her vitamin D as a correctable factor which appears that she has corrected this and repeat a short antrum study next year and then go from there. Patient would prefer at this point to hold on treatment other than her vitamin D and repeat the bone density next year understanding the risks of progression and fracture.

## 2013-08-29 NOTE — Patient Instructions (Signed)
Continue on your extra vitamin D. Repeat bone density next year.

## 2013-10-17 ENCOUNTER — Other Ambulatory Visit: Payer: Self-pay | Admitting: Gastroenterology

## 2014-01-14 ENCOUNTER — Other Ambulatory Visit: Payer: 59

## 2014-01-14 DIAGNOSIS — E7800 Pure hypercholesterolemia, unspecified: Secondary | ICD-10-CM

## 2014-01-14 DIAGNOSIS — E559 Vitamin D deficiency, unspecified: Secondary | ICD-10-CM

## 2014-01-14 LAB — LIPID PANEL
CHOL/HDL RATIO: 3.9 ratio
CHOLESTEROL: 210 mg/dL — AB (ref 0–200)
HDL: 54 mg/dL (ref >39)
LDL Cholesterol: 137 mg/dL — ABNORMAL HIGH (ref 0–99)
TRIGLYCERIDES: 95 mg/dL (ref <150)
VLDL: 19 mg/dL (ref 0–40)

## 2014-01-15 ENCOUNTER — Encounter: Payer: Self-pay | Admitting: Gynecology

## 2014-01-15 LAB — VITAMIN D 25 HYDROXY (VIT D DEFICIENCY, FRACTURES): Vit D, 25-Hydroxy: 62 ng/mL (ref 30–89)

## 2014-05-28 ENCOUNTER — Other Ambulatory Visit: Payer: Self-pay | Admitting: Gynecology

## 2014-05-28 DIAGNOSIS — Z1231 Encounter for screening mammogram for malignant neoplasm of breast: Secondary | ICD-10-CM

## 2014-06-30 ENCOUNTER — Ambulatory Visit (HOSPITAL_COMMUNITY)
Admission: RE | Admit: 2014-06-30 | Discharge: 2014-06-30 | Disposition: A | Discharge status: Home / Self Care | Payer: 59 | Source: Ambulatory Visit | Attending: Gynecology | Admitting: Gynecology

## 2014-06-30 DIAGNOSIS — Z1231 Encounter for screening mammogram for malignant neoplasm of breast: Secondary | ICD-10-CM | POA: Diagnosis present

## 2014-07-01 ENCOUNTER — Encounter: Payer: Self-pay | Admitting: Gynecology

## 2014-07-01 ENCOUNTER — Ambulatory Visit (INDEPENDENT_AMBULATORY_CARE_PROVIDER_SITE_OTHER): Payer: 59 | Admitting: Gynecology

## 2014-07-01 VITALS — BP 124/82 | Ht 65.0 in | Wt 191.0 lb

## 2014-07-01 DIAGNOSIS — M810 Age-related osteoporosis without current pathological fracture: Secondary | ICD-10-CM

## 2014-07-01 DIAGNOSIS — Z01419 Encounter for gynecological examination (general) (routine) without abnormal findings: Secondary | ICD-10-CM

## 2014-07-01 LAB — CBC WITH DIFFERENTIAL/PLATELET
Basophils Absolute: 0.1 10*3/uL (ref 0.0–0.1)
Basophils Relative: 1 % (ref 0–1)
EOS PCT: 1 % (ref 0–5)
Eosinophils Absolute: 0.1 10*3/uL (ref 0.0–0.7)
HCT: 37.7 % (ref 36.0–46.0)
Hemoglobin: 12.3 g/dL (ref 12.0–15.0)
LYMPHS ABS: 2.6 10*3/uL (ref 0.7–4.0)
Lymphocytes Relative: 48 % — ABNORMAL HIGH (ref 12–46)
MCH: 27.8 pg (ref 26.0–34.0)
MCHC: 32.6 g/dL (ref 30.0–36.0)
MCV: 85.1 fL (ref 78.0–100.0)
Monocytes Absolute: 0.4 10*3/uL (ref 0.1–1.0)
Monocytes Relative: 7 % (ref 3–12)
Neutro Abs: 2.3 10*3/uL (ref 1.7–7.7)
Neutrophils Relative %: 43 % (ref 43–77)
PLATELETS: 260 10*3/uL (ref 150–400)
RBC: 4.43 MIL/uL (ref 3.87–5.11)
RDW: 14.6 % (ref 11.5–15.5)
WBC: 5.4 10*3/uL (ref 4.0–10.5)

## 2014-07-01 NOTE — Patient Instructions (Signed)
Follow up for bone density as scheduled.  You may obtain a copy of any labs that were done today by logging onto MyChart as outlined in the instructions provided with your AVS (after visit summary). The office will not call with normal lab results but certainly if there are any significant abnormalities then we will contact you.   Health Maintenance, Female A healthy lifestyle and preventative care can promote health and wellness.  Maintain regular health, dental, and eye exams.  Eat a healthy diet. Foods like vegetables, fruits, whole grains, low-fat dairy products, and lean protein foods contain the nutrients you need without too many calories. Decrease your intake of foods high in solid fats, added sugars, and salt. Get information about a proper diet from your caregiver, if necessary.  Regular physical exercise is one of the most important things you can do for your health. Most adults should get at least 150 minutes of moderate-intensity exercise (any activity that increases your heart rate and causes you to sweat) each week. In addition, most adults need muscle-strengthening exercises on 2 or more days a week.   Maintain a healthy weight. The body mass index (BMI) is a screening tool to identify possible weight problems. It provides an estimate of body fat based on height and weight. Your caregiver can help determine your BMI, and can help you achieve or maintain a healthy weight. For adults 20 years and older:  A BMI below 18.5 is considered underweight.  A BMI of 18.5 to 24.9 is normal.  A BMI of 25 to 29.9 is considered overweight.  A BMI of 30 and above is considered obese.  Maintain normal blood lipids and cholesterol by exercising and minimizing your intake of saturated fat. Eat a balanced diet with plenty of fruits and vegetables. Blood tests for lipids and cholesterol should begin at age 20 and be repeated every 5 years. If your lipid or cholesterol levels are high, you are over  50, or you are a high risk for heart disease, you may need your cholesterol levels checked more frequently.Ongoing high lipid and cholesterol levels should be treated with medicines if diet and exercise are not effective.  If you smoke, find out from your caregiver how to quit. If you do not use tobacco, do not start.  Lung cancer screening is recommended for adults aged 55 80 years who are at high risk for developing lung cancer because of a history of smoking. Yearly low-dose computed tomography (CT) is recommended for people who have at least a 30-pack-year history of smoking and are a current smoker or have quit within the past 15 years. A pack year of smoking is smoking an average of 1 pack of cigarettes a day for 1 year (for example: 1 pack a day for 30 years or 2 packs a day for 15 years). Yearly screening should continue until the smoker has stopped smoking for at least 15 years. Yearly screening should also be stopped for people who develop a health problem that would prevent them from having lung cancer treatment.  If you are pregnant, do not drink alcohol. If you are breastfeeding, be very cautious about drinking alcohol. If you are not pregnant and choose to drink alcohol, do not exceed 1 drink per day. One drink is considered to be 12 ounces (355 mL) of beer, 5 ounces (148 mL) of wine, or 1.5 ounces (44 mL) of liquor.  Avoid use of street drugs. Do not share needles with anyone. Ask for help   if you need support or instructions about stopping the use of drugs.  High blood pressure causes heart disease and increases the risk of stroke. Blood pressure should be checked at least every 1 to 2 years. Ongoing high blood pressure should be treated with medicines, if weight loss and exercise are not effective.  If you are 55 to 52 years old, ask your caregiver if you should take aspirin to prevent strokes.  Diabetes screening involves taking a blood sample to check your fasting blood sugar level.  This should be done once every 3 years, after age 45, if you are within normal weight and without risk factors for diabetes. Testing should be considered at a younger age or be carried out more frequently if you are overweight and have at least 1 risk factor for diabetes.  Breast cancer screening is essential preventative care for women. You should practice "breast self-awareness." This means understanding the normal appearance and feel of your breasts and may include breast self-examination. Any changes detected, no matter how small, should be reported to a caregiver. Women in their 20s and 30s should have a clinical breast exam (CBE) by a caregiver as part of a regular health exam every 1 to 3 years. After age 40, women should have a CBE every year. Starting at age 40, women should consider having a mammogram (breast X-ray) every year. Women who have a family history of breast cancer should talk to their caregiver about genetic screening. Women at a high risk of breast cancer should talk to their caregiver about having an MRI and a mammogram every year.  Breast cancer gene (BRCA)-related cancer risk assessment is recommended for women who have family members with BRCA-related cancers. BRCA-related cancers include breast, ovarian, tubal, and peritoneal cancers. Having family members with these cancers may be associated with an increased risk for harmful changes (mutations) in the breast cancer genes BRCA1 and BRCA2. Results of the assessment will determine the need for genetic counseling and BRCA1 and BRCA2 testing.  The Pap test is a screening test for cervical cancer. Women should have a Pap test starting at age 21. Between ages 21 and 29, Pap tests should be repeated every 2 years. Beginning at age 30, you should have a Pap test every 3 years as long as the past 3 Pap tests have been normal. If you had a hysterectomy for a problem that was not cancer or a condition that could lead to cancer, then you no  longer need Pap tests. If you are between ages 65 and 70, and you have had normal Pap tests going back 10 years, you no longer need Pap tests. If you have had past treatment for cervical cancer or a condition that could lead to cancer, you need Pap tests and screening for cancer for at least 20 years after your treatment. If Pap tests have been discontinued, risk factors (such as a new sexual partner) need to be reassessed to determine if screening should be resumed. Some women have medical problems that increase the chance of getting cervical cancer. In these cases, your caregiver may recommend more frequent screening and Pap tests.  The human papillomavirus (HPV) test is an additional test that may be used for cervical cancer screening. The HPV test looks for the virus that can cause the cell changes on the cervix. The cells collected during the Pap test can be tested for HPV. The HPV test could be used to screen women aged 30 years and older, and   be used in women of any age who have unclear Pap test results. After the age of 63, women should have HPV testing at the same frequency as a Pap test.  Colorectal cancer can be detected and often prevented. Most routine colorectal cancer screening begins at the age of 67 and continues through age 18. However, your caregiver Emel recommend screening at an earlier age if you have risk factors for colon cancer. On a yearly basis, your caregiver Vila provide home test kits to check for hidden blood in the stool. Use of a small camera at the end of a tube, to directly examine the colon (sigmoidoscopy or colonoscopy), can detect the earliest forms of colorectal cancer. Talk to your caregiver about this at age 65, when routine screening begins. Direct examination of the colon should be repeated every 5 to 10 years through age 70, unless early forms of pre-cancerous polyps or small growths are found.  Hepatitis C blood testing is recommended for all people born from  [birth year] through 1964 and any individual with known risks for hepatitis C.  Practice safe sex. Use condoms and avoid high-risk sexual practices to reduce the spread of sexually transmitted infections (STIs). Sexually active women aged 26 and younger should be checked for Chlamydia, which is a common sexually transmitted infection. Older women with new or multiple partners should also be tested for Chlamydia. Testing for other STIs is recommended if you are sexually active and at increased risk.  Osteoporosis is a disease in which the bones lose minerals and strength with aging. This can result in serious bone fractures. The risk of osteoporosis can be identified using a bone density scan. Women ages 9 and over and women at risk for fractures or osteoporosis should discuss screening with their caregivers. Ask your caregiver whether you should be taking a calcium supplement or vitamin D to reduce the rate of osteoporosis.  Menopause can be associated with physical symptoms and risks. Hormone replacement therapy is available to decrease symptoms and risks. You should talk to your caregiver about whether hormone replacement therapy is right for you.  Use sunscreen. Apply sunscreen liberally and repeatedly throughout the day. You should seek shade when your shadow is shorter than you. Protect yourself by wearing long sleeves, pants, a wide-brimmed hat, and sunglasses year round, whenever you are outdoors.  Notify your caregiver of new moles or changes in moles, especially if there is a change in shape or color. Also notify your caregiver if a mole is larger than the size of a pencil eraser.  Stay current with your immunizations. Document Released: 05/01/2011 Document Revised: 02/10/2013 Document Reviewed: 05/01/2011 [facility] Patient Information 2014 [employer].

## 2014-07-01 NOTE — Progress Notes (Signed)
Sabrina Gallagher 04-Sep-1961 [id number]        53 y.o.  [id number] for annual exam.  Several issues noted below.  Past medical history,surgical history, problem list, medications, allergies, family history and social history were all reviewed and documented as reviewed in the EPIC chart.  ROS:  12 system ROS performed with pertinent positives and negatives included in the history, assessment and plan.   Additional significant findings :  None   Exam: [facility] person Vitals:   07/01/14 1521  BP: 124/82  Height:  (1.651 m)  Weight: 191 lb (86.637 kg)   General appearance:  Normal affect, orientation and appearance. Skin: Grossly normal HEENT: Without gross lesions.  No cervical or supraclavicular adenopathy. Thyroid normal.  Lungs:  Clear without wheezing, rales or rhonchi Cardiac: RR, without RMG Abdominal:  Soft, nontender, without masses, guarding, rebound, organomegaly or hernia Breasts:  Examined lying and sitting without masses, retractions, discharge or axillary adenopathy. Pelvic:  Ext/BUS/vagina normal  Cervix normal  Uterus anteverted, normal size, shape and contour, midline and mobile nontender   Adnexa  Without masses or tenderness    Anus and perineum  Normal   Rectovaginal  Normal sphincter tone without palpated masses or tenderness.    Assessment/Plan:  53 y.o. [id number] female for annual exam.   1. Premature menopause age 78. No significant hot flashes, night sweats, vaginal dryness or dyspareunia. Transiently tried HRT but could not tolerate. No bleeding. Continue to monitor. Report any vaginal bleeding. 2. Osteoporosis DEXA 07/2013 T score -2.9 [facility] spine. See 08/29/2013 note. Patient elected not to be treated. Plan DEXA now at 1 year short interval followup. Increase calcium vitamin D reviewed. Check vitamin D level today. 3. Mammography 06/2014. Continue with annual mammography. SBE monthly reviewed. 4. Pap smear 2013. No Pap smear done today. Repeat next year at 3  year interval for current screening guidelines. No history of significant abnormal Pap smears previously. 5. Colonoscopy 2014. Repeat at their recommended interval. 6. Health maintenance. CBC comprehensive metabolic panel lipid profile urinalysis TSH vitamin D ordered. Followup for DEXA otherwise annually, sooner if any issues..   Note: This document was prepared with digital dictation and possible smart phrase technology. Any transcriptional errors that result from this process are unintentional.   Dara Lords MD, 4:50 PM 07/01/2014

## 2014-07-02 LAB — URINALYSIS W MICROSCOPIC + REFLEX CULTURE
BACTERIA UA: NONE SEEN
BILIRUBIN URINE: NEGATIVE
CASTS: NONE SEEN
CRYSTALS: NONE SEEN
GLUCOSE, UA: NEGATIVE mg/dL
HGB URINE DIPSTICK: NEGATIVE
KETONES UR: NEGATIVE mg/dL
Leukocytes, UA: NEGATIVE
Nitrite: NEGATIVE
PH: 6 (ref 5.0–8.0)
Protein, ur: NEGATIVE mg/dL
Squamous Epithelial / LPF: NONE SEEN
Urobilinogen, UA: 0.2 mg/dL (ref 0.0–1.0)

## 2014-07-02 LAB — LIPID PANEL
CHOL/HDL RATIO: 3.4 ratio
CHOLESTEROL: 196 mg/dL (ref 0–200)
HDL: 57 mg/dL (ref >39)
LDL Cholesterol: 108 mg/dL — ABNORMAL HIGH (ref 0–99)
TRIGLYCERIDES: 157 mg/dL — AB (ref <150)
VLDL: 31 mg/dL (ref 0–40)

## 2014-07-02 LAB — COMPREHENSIVE METABOLIC PANEL
ALT: 13 U/L (ref 0–35)
AST: 17 U/L (ref 0–37)
Albumin: 4.4 g/dL (ref 3.5–5.2)
Alkaline Phosphatase: 72 U/L (ref 39–117)
BUN: 13 mg/dL (ref 6–23)
CALCIUM: 9.2 mg/dL (ref 8.4–10.5)
CHLORIDE: 99 meq/L (ref 96–112)
CO2: 24 meq/L (ref 19–32)
CREATININE: 0.95 mg/dL (ref 0.50–1.10)
Glucose, Bld: 82 mg/dL (ref 70–99)
Potassium: 3.9 mEq/L (ref 3.5–5.3)
Sodium: 135 mEq/L (ref 135–145)
Total Bilirubin: 0.4 mg/dL (ref 0.2–1.2)
Total Protein: 7.2 g/dL (ref 6.0–8.3)

## 2014-07-02 LAB — VITAMIN D 25 HYDROXY (VIT D DEFICIENCY, FRACTURES): Vit D, 25-Hydroxy: 48 ng/mL (ref 30–89)

## 2014-07-02 LAB — TSH: TSH: 1.462 u[IU]/mL (ref 0.350–4.500)

## 2014-07-31 ENCOUNTER — Telehealth: Payer: Self-pay | Admitting: *Deleted

## 2014-07-31 NOTE — Telephone Encounter (Signed)
Pt informed with all lab results on 07/01/14.

## 2014-08-31 ENCOUNTER — Encounter: Payer: Self-pay | Admitting: Gynecology

## 2015-04-12 ENCOUNTER — Encounter (HOSPITAL_COMMUNITY): Payer: Self-pay | Admitting: Emergency Medicine

## 2015-04-12 ENCOUNTER — Emergency Department (HOSPITAL_COMMUNITY): Payer: 59

## 2015-04-12 ENCOUNTER — Emergency Department (HOSPITAL_COMMUNITY)
Admission: EM | Admit: 2015-04-12 | Discharge: 2015-04-12 | Disposition: A | Discharge status: Home / Self Care | Payer: 59 | Attending: Emergency Medicine | Admitting: Emergency Medicine

## 2015-04-12 DIAGNOSIS — M62830 Muscle spasm of back: Secondary | ICD-10-CM | POA: Diagnosis not present

## 2015-04-12 DIAGNOSIS — R0602 Shortness of breath: Secondary | ICD-10-CM | POA: Diagnosis not present

## 2015-04-12 DIAGNOSIS — R079 Chest pain, unspecified: Secondary | ICD-10-CM

## 2015-04-12 DIAGNOSIS — Z8639 Personal history of other endocrine, nutritional and metabolic disease: Secondary | ICD-10-CM | POA: Diagnosis not present

## 2015-04-12 DIAGNOSIS — Z79899 Other long term (current) drug therapy: Secondary | ICD-10-CM | POA: Insufficient documentation

## 2015-04-12 DIAGNOSIS — Z8719 Personal history of other diseases of the digestive system: Secondary | ICD-10-CM | POA: Insufficient documentation

## 2015-04-12 LAB — CBC
HCT: 38.3 % (ref 36.0–46.0)
Hemoglobin: 12.5 g/dL (ref 12.0–15.0)
MCH: 28 pg (ref 26.0–34.0)
MCHC: 32.6 g/dL (ref 30.0–36.0)
MCV: 85.9 fL (ref 78.0–100.0)
Platelets: 230 10*3/uL (ref 150–400)
RBC: 4.46 MIL/uL (ref 3.87–5.11)
RDW: 14 % (ref 11.5–15.5)
WBC: 7.2 10*3/uL (ref 4.0–10.5)

## 2015-04-12 LAB — BASIC METABOLIC PANEL
ANION GAP: 8 (ref 5–15)
BUN: 11 mg/dL (ref 6–20)
CO2: 25 mmol/L (ref 22–32)
Calcium: 9.2 mg/dL (ref 8.9–10.3)
Chloride: 106 mmol/L (ref 101–111)
Creatinine, Ser: 0.74 mg/dL (ref 0.44–1.00)
GFR calc Af Amer: >60 mL/min (ref >60)
GLUCOSE: 106 mg/dL — AB (ref 65–99)
POTASSIUM: 3.4 mmol/L — AB (ref 3.5–5.1)
SODIUM: 139 mmol/L (ref 135–145)

## 2015-04-12 LAB — I-STAT TROPONIN, ED
TROPONIN I, POC: 0 ng/mL (ref 0.00–0.08)
Troponin i, poc: 0 ng/mL (ref 0.00–0.08)

## 2015-04-12 LAB — D-DIMER, QUANTITATIVE: D-Dimer, Quant: <0.27 ug/mL-FEU (ref 0.00–0.48)

## 2015-04-12 LAB — BRAIN NATRIURETIC PEPTIDE: B Natriuretic Peptide: 11.2 pg/mL (ref 0.0–100.0)

## 2015-04-12 MED ORDER — METHOCARBAMOL 500 MG PO TABS: 500.0000 mg | ORAL_TABLET | Freq: Two times a day (BID) | ORAL | Status: DC

## 2015-04-12 NOTE — Discharge Instructions (Signed)
Take Robaxin as needed for muscle spasm. Refer to attached documents for more information. Return to the ED with worsening or concerning symptoms.

## 2015-04-12 NOTE — ED Notes (Signed)
Reports dull pain to L chest that radiates to L arm with sob.  States pain woke her up 30 min ago.  Pt anxious and tearful at triage.  Denies nausea and vomiting.

## 2015-04-12 NOTE — ED Notes (Signed)
Pt states left shoulder "nagging pain" -- "annoying" nothing makes it better

## 2015-04-12 NOTE — ED Provider Notes (Signed)
CSN: [id number]     Arrival date & time 04/12/15  0300 History   First MD Initiated Contact with Patient 04/12/15 [phone]     Chief Complaint  Patient presents with  . Chest Pain     (Consider location/radiation/quality/duration/timing/severity/associated sxs/prior Treatment) HPI Comments: Patient is a a healthy 54 year old female with no significant past medical history who presents after waking up around 2:00am with dull chest pain located in her left chest with radiation to her left arm. The pain has been constant since the onset but has improved. She reports associated SOB that has also improved since the onset. No aggravating/alleviating factors. No other associated symptoms. Patient reports having 2 days of left arm pain prior to this episode. Patient thinks she may have slept on her arm wrong but the SOB and chest pain "scared her" so she wanted to make sure everything was ok.    Past Medical History  Diagnosis Date  . Reflux   . Premature ovarian failure age 62  . Osteoporosis 07/2013    T score -2.9 [facility] spine   History reviewed. No pertinent past surgical history. Family History  Problem Relation Age of Onset  . Colon cancer Father    History  Substance Use Topics  . Smoking status: Never Smoker   . Smokeless tobacco: Not on file  . Alcohol Use: No   OB History    Gravida Para Term Preterm AB TAB SAB Ectopic Multiple Living   Review of Systems  Constitutional: Negative for fever, chills and fatigue.  HENT: Negative for trouble swallowing.   Eyes: Negative for visual disturbance.  Respiratory: Positive for shortness of breath.   Cardiovascular: Positive for chest pain. Negative for palpitations.  Gastrointestinal: Negative for nausea, vomiting, abdominal pain and diarrhea.  Genitourinary: Negative for dysuria and difficulty urinating.  Musculoskeletal: Negative for arthralgias and neck pain.  Skin: Negative for color change.  Neurological: Negative  for dizziness and weakness.  Psychiatric/Behavioral: Negative for dysphoric mood.      Allergies  Review of patient's allergies indicates no known allergies.  Home Medications   Prior to Admission medications   Medication Sig Start Date End Date Taking? Authorizing Provider  Cholecalciferol (VITAMIN D PO) Take 1,000 Units by mouth daily.    Yes Historical Provider, MD   BP 108/65 mmHg  Pulse 60  Temp(Src) 98.7 F (37.1 C) (Oral)  Resp 16  Ht  (1.651 m)  Wt 200 lb (90.719 kg)  BMI 33.28 kg/m2  SpO2 98% Physical Exam  Constitutional: She is oriented to person, place, and time. She appears well-developed and well-nourished. No distress.  HENT:  Head: Normocephalic and atraumatic.  Eyes: Conjunctivae and EOM are normal.  Neck: Normal range of motion.  Cardiovascular: Normal rate and regular rhythm.  Exam reveals no gallop and no friction rub.   No murmur heard. No lower extremity edema or calf tenderness to palpation.   Pulmonary/Chest: Effort normal and breath sounds normal. She has no wheezes. She has no rales. She exhibits no tenderness.  Abdominal: Soft. She exhibits no distension. There is no tenderness. There is no rebound.  Musculoskeletal: Normal range of motion.  Neurological: She is alert and oriented to person, place, and time. Coordination normal.  Speech is goal-oriented. Moves limbs without ataxia.   Skin: Skin is warm and dry.  Psychiatric: She has a normal mood and affect. Her behavior is normal.  Nursing note and vitals reviewed.   ED Course  Procedures (including critical care time) Labs Review Labs Reviewed  BASIC METABOLIC PANEL - Abnormal; Notable for the following:    Potassium 3.4 (*)    Glucose, Bld 106 (*)    All other components within normal limits  CBC  BRAIN NATRIURETIC PEPTIDE  D-DIMER, QUANTITATIVE (NOT AT [facility])  I-STAT TROPOININ, ED  Rosezena Sensor, ED    Imaging Review Dg Chest 2 View  04/12/2015   CLINICAL DATA:  LEFT  chest pain tingling to LEFT arm, shortness of breath awaking patient.  EXAM: CHEST  2 VIEW  COMPARISON:  Chest radiograph May 27, 2012  FINDINGS: Cardiomediastinal silhouette is unremarkable. The lungs are clear without pleural effusions or focal consolidations. Trachea projects midline and there is no pneumothorax. Soft tissue planes and included osseous structures are non-suspicious.  IMPRESSION: Normal chest.   Electronically Signed   By: Awilda Metro M.D.   On: 04/12/2015 03:53     EKG Interpretation   Date/Time:  Monday April 12 2015 43:60:67 EDT Ventricular Rate:  76 PR Interval:  154 QRS Duration: 84 QT Interval:  382 QTC Calculation: 429 R Axis:   52 Text Interpretation:  Normal sinus rhythm ST \\T \ T wave abnormality,  consider inferolateral ischemia Abnormal ECG Confirmed by Erroll Luna [phone]) on 04/12/2015 6:09:02 AM      MDM   Final diagnoses:  Chest pain, unspecified chest pain type  SOB (shortness of breath)  Muscle spasm of back    6:44 AM Labs and chest xray unremarkable for acute changes. Vitals stable and patient afebrile.   7:59 AM  D-dimer negative. Patient will have delta troponin. If delta troponin is negative, patient can be discharged with outpatient follow up. HEART score 2. Patient is appropriate for outpatient follow up.    Emilia Beck, PA-C 04/12/15 3524  Tomasita Crumble, MD 04/12/15 [phone]

## 2015-07-12 ENCOUNTER — Other Ambulatory Visit: Payer: Self-pay | Admitting: Gynecology

## 2015-07-12 DIAGNOSIS — Z1231 Encounter for screening mammogram for malignant neoplasm of breast: Secondary | ICD-10-CM

## 2015-07-13 ENCOUNTER — Ambulatory Visit (HOSPITAL_COMMUNITY)
Admission: RE | Admit: 2015-07-13 | Discharge: 2015-07-13 | Disposition: A | Discharge status: Home / Self Care | Payer: 59 | Source: Ambulatory Visit | Attending: Gynecology | Admitting: Gynecology

## 2015-07-13 DIAGNOSIS — Z1231 Encounter for screening mammogram for malignant neoplasm of breast: Secondary | ICD-10-CM | POA: Diagnosis not present

## 2015-08-31 DIAGNOSIS — R87611 Atypical squamous cells cannot exclude high grade squamous intraepithelial lesion on cytologic smear of cervix (ASC-H): Secondary | ICD-10-CM

## 2015-08-31 HISTORY — DX: Atypical squamous cells cannot exclude high grade squamous intraepithelial lesion on cytologic smear of cervix (ASC-H): R87.611

## 2015-09-01 ENCOUNTER — Emergency Department (HOSPITAL_COMMUNITY)
Admission: EM | Admit: 2015-09-01 | Discharge: 2015-09-01 | Disposition: A | Discharge status: Home / Self Care | Payer: 59 | Attending: Emergency Medicine | Admitting: Emergency Medicine

## 2015-09-01 ENCOUNTER — Emergency Department (HOSPITAL_COMMUNITY): Payer: 59

## 2015-09-01 ENCOUNTER — Encounter (HOSPITAL_COMMUNITY): Payer: Self-pay | Admitting: Emergency Medicine

## 2015-09-01 DIAGNOSIS — Z79899 Other long term (current) drug therapy: Secondary | ICD-10-CM | POA: Diagnosis not present

## 2015-09-01 DIAGNOSIS — R0789 Other chest pain: Secondary | ICD-10-CM

## 2015-09-01 DIAGNOSIS — R079 Chest pain, unspecified: Secondary | ICD-10-CM | POA: Diagnosis not present

## 2015-09-01 LAB — BASIC METABOLIC PANEL
ANION GAP: 14 (ref 5–15)
BUN: 7 mg/dL (ref 6–20)
CO2: 24 mmol/L (ref 22–32)
CREATININE: 0.78 mg/dL (ref 0.44–1.00)
Calcium: 9.7 mg/dL (ref 8.9–10.3)
Chloride: 103 mmol/L (ref 101–111)
GFR calc non Af Amer: >60 mL/min (ref >60)
Glucose, Bld: 91 mg/dL (ref 65–99)
Potassium: 3.8 mmol/L (ref 3.5–5.1)
Sodium: 141 mmol/L (ref 135–145)

## 2015-09-01 LAB — CBC
HEMATOCRIT: 39.9 % (ref 36.0–46.0)
Hemoglobin: 12.9 g/dL (ref 12.0–15.0)
MCH: 27.6 pg (ref 26.0–34.0)
MCHC: 32.3 g/dL (ref 30.0–36.0)
MCV: 85.3 fL (ref 78.0–100.0)
PLATELETS: 262 10*3/uL (ref 150–400)
RBC: 4.68 MIL/uL (ref 3.87–5.11)
RDW: 14 % (ref 11.5–15.5)
WBC: 5.7 10*3/uL (ref 4.0–10.5)

## 2015-09-01 LAB — I-STAT TROPONIN, ED
Troponin i, poc: 0.01 ng/mL (ref 0.00–0.08)
Troponin i, poc: 0 ng/mL (ref 0.00–0.08)

## 2015-09-01 NOTE — ED Notes (Signed)
[facility] constant chest pressure that began this morning. Pt sates some sob before the pain began but denies any at this time. [facility] gave 324 mg of aspirin, pt refused nitro at facility or with EMS. [facility] noted inverted t waves on pt ekg and ems noted pt to have 3 sec runs of atrial flutter. Pt NSR on the monitor at this time. nad noted, skin warm and dry.

## 2015-09-01 NOTE — Discharge Instructions (Signed)
Return here as needed. Follow up with the Cardiologist provided.

## 2015-09-01 NOTE — ED Notes (Signed)
Pt states [facility], pressure decreased significantly,

## 2015-09-01 NOTE — ED Provider Notes (Signed)
CSN: [id number]     Arrival date & time 09/01/15  1737 History   First MD Initiated Contact with Patient 09/01/15 1800     Chief Complaint  Patient presents with  . Chest Pain     (Consider location/radiation/quality/duration/timing/severity/associated sxs/prior Treatment) HPI Patient presents to the emergency department with chest pain that started this morning around 4 AM the patient states that patient states that the chest pain is bilateral in nature.  Sees be worse with movements.  She does have some mild shortness of breath with deep breathing to the pain.  The patient states that she was seen at an urgent care facility and given aspirin prior to arrival.  Patient states that there is concern for EKG abnormalities.  The patient denies nausea, vomiting, abdominal pain, back pain, fever, cough, runny nose, sore throat, rash, dizziness, lightheadedness or syncope or syncope.  The patient states that nothing seems to make her condition better or worse.  The pain was constant Past Medical History  Diagnosis Date  . Reflux   . Premature ovarian failure age 54  . Osteoporosis 07/2013    T score -2.9 AP spine   History reviewed. No pertinent past surgical history. Family History  Problem Relation Age of Onset  . Colon cancer Father    Social History  Substance Use Topics  . Smoking status: Never Smoker   . Smokeless tobacco: None  . Alcohol Use: No   OB History    Gravida Para Term Preterm AB TAB SAB Ectopic Multiple Living   3 2 2  1     2      Review of Systems All other systems negative except as documented in the HPI. All pertinent positives and negatives as reviewed in the HPI.   Allergies  Review of patient's allergies indicates no known allergies.  Home Medications   Prior to Admission medications   Medication Sig Start Date End Date Taking? Authorizing Provider  Cholecalciferol (VITAMIN D) 2000 UNITS CAPS Take 2,000 Units by mouth at bedtime.   Yes Historical  Provider, MD  methocarbamol (ROBAXIN) 500 MG tablet Take 1 tablet (500 mg total) by mouth 2 (two) times daily. 04/12/15   Kaitlyn Szekalski, PA-C   BP 118/75 mmHg  Pulse 74  Resp 20  SpO2 99% Physical Exam  Constitutional: She is oriented to person, place, and time. She appears well-developed and well-nourished. No distress.  HENT:  Head: Normocephalic and atraumatic.  Mouth/Throat: Oropharynx is clear and moist.  Eyes: Pupils are equal, round, and reactive to light.  Neck: Normal range of motion. Neck supple.  Cardiovascular: Normal rate, regular rhythm and normal heart sounds.  Exam reveals no gallop and no friction rub.   No murmur heard. Pulmonary/Chest: Effort normal and breath sounds normal. No respiratory distress. She has no wheezes. She exhibits tenderness.  Abdominal: Soft. Bowel sounds are normal. She exhibits no distension. There is no tenderness.  Neurological: She is alert and oriented to person, place, and time. She exhibits normal muscle tone. Coordination normal.  Skin: Skin is warm and dry. No rash noted. No erythema.  Psychiatric: She has a normal mood and affect. Her behavior is normal.  Nursing note and vitals reviewed.   ED Course  Procedures (including critical care time) Labs Review Labs Reviewed  BASIC METABOLIC PANEL  CBC  I-STAT TROPOININ, ED  Rosezena SensorI-STAT TROPOININ, ED    Imaging Review Dg Chest 2 View  09/01/2015  CLINICAL DATA:  54 year old female with history of chest pain  for the past 12 hours. EXAM: CHEST  2 VIEW COMPARISON:  Chest x-ray 04/12/2015. FINDINGS: Lung volumes are normal. No consolidative airspace disease. No pleural effusions. No pneumothorax. No pulmonary nodule or mass noted. Pulmonary vasculature and the cardiomediastinal silhouette are within normal limits. IMPRESSION: No radiographic evidence of acute cardiopulmonary disease. Electronically Signed   By: Trudie Reed M.D.   On: 09/01/2015 19:21   I have personally reviewed and  evaluated these images and lab results as part of my medical decision-making.   EKG Interpretation   Date/Time:  Wednesday September 01 2015 17:46:18 EDT Ventricular Rate:  66 PR Interval:  154 QRS Duration: 80 QT Interval:  406 QTC Calculation: 425 R Axis:   67 Text Interpretation:  Sinus rhythm Inferiolateral TWI in the infero  lateral leads No significant change since last tracing Confirmed by LIU  MD, DANA ([id number]) on 09/01/2015 5:53:22 PM      Patient be treated for her chest discomfort.  It seems atypical in nature as it is bilateral, worse with movement and palpation along with the fact that is been constant since 4 AM patient's pain at this time.  This improved following medications.  He should states she has been under a lot of stress    Charlestine Night, PA-C 09/06/15 6045  Lavera Guise, MD 09/06/15 [phone]

## 2015-09-16 ENCOUNTER — Other Ambulatory Visit (HOSPITAL_COMMUNITY)
Admission: RE | Admit: 2015-09-16 | Discharge: 2015-09-16 | Disposition: A | Discharge status: Home / Self Care | Payer: 59 | Source: Ambulatory Visit | Attending: Gynecology | Admitting: Gynecology

## 2015-09-16 ENCOUNTER — Ambulatory Visit (INDEPENDENT_AMBULATORY_CARE_PROVIDER_SITE_OTHER): Payer: 59 | Admitting: Gynecology

## 2015-09-16 ENCOUNTER — Encounter: Payer: Self-pay | Admitting: Gynecology

## 2015-09-16 VITALS — BP 122/78 | Ht 66.0 in | Wt 200.0 lb

## 2015-09-16 DIAGNOSIS — Z01411 Encounter for gynecological examination (general) (routine) with abnormal findings: Secondary | ICD-10-CM | POA: Diagnosis present

## 2015-09-16 DIAGNOSIS — Z13220 Encounter for screening for lipoid disorders: Secondary | ICD-10-CM

## 2015-09-16 DIAGNOSIS — N952 Postmenopausal atrophic vaginitis: Secondary | ICD-10-CM

## 2015-09-16 DIAGNOSIS — Z01419 Encounter for gynecological examination (general) (routine) without abnormal findings: Secondary | ICD-10-CM

## 2015-09-16 DIAGNOSIS — M810 Age-related osteoporosis without current pathological fracture: Secondary | ICD-10-CM

## 2015-09-16 DIAGNOSIS — Z1151 Encounter for screening for human papillomavirus (HPV): Secondary | ICD-10-CM | POA: Diagnosis not present

## 2015-09-16 LAB — LIPID PANEL
CHOL/HDL RATIO: 4.2 ratio (ref <=5.0)
CHOLESTEROL: 197 mg/dL (ref 125–200)
HDL: 47 mg/dL (ref >=46)
LDL Cholesterol: 127 mg/dL (ref <130)
Triglycerides: 115 mg/dL (ref <150)
VLDL: 23 mg/dL (ref <30)

## 2015-09-16 NOTE — Patient Instructions (Signed)
Follow up for bone density as scheduled.  You may obtain a copy of any labs that were done today by logging onto MyChart as outlined in the instructions provided with your AVS (after visit summary). The office will not call with normal lab results but certainly if there are any significant abnormalities then we will contact you.   Health Maintenance Adopting a healthy lifestyle and getting preventive care can go a long way to promote health and wellness. Talk with your health care provider about what schedule of regular examinations is right for you. This is a good chance for you to check in with your provider about disease prevention and staying healthy. In between checkups, there are plenty of things you can do on your own. Experts have done a lot of research about which lifestyle changes and preventive measures are most likely to keep you healthy. Ask your health care provider for more information. WEIGHT AND DIET  Eat a healthy diet  Be sure to include plenty of vegetables, fruits, low-fat dairy products, and lean protein.  Do not eat a lot of foods high in solid fats, added sugars, or salt.  Get regular exercise. This is one of the most important things you can do for your health.  Most adults should exercise for at least 150 minutes each week. The exercise should increase your heart rate and make you sweat (moderate-intensity exercise).  Most adults should also do strengthening exercises at least twice a week. This is in addition to the moderate-intensity exercise.  Maintain a healthy weight  Body mass index (BMI) is a measurement that can be used to identify possible weight problems. It estimates body fat based on height and weight. Your health care provider can help determine your BMI and help you achieve or maintain a healthy weight.  For females 69 years of age and older:   A BMI below 18.5 is considered underweight.  A BMI of 18.5 to 24.9 is normal.  A BMI of 25 to 29.9 is  considered overweight.  A BMI of 30 and above is considered obese.  Watch levels of cholesterol and blood lipids  You should start having your blood tested for lipids and cholesterol at 53 years of age, then have this test every 5 years.  You may need to have your cholesterol levels checked more often if:  Your lipid or cholesterol levels are high.  You are older than 53 years of age.  You are at high risk for heart disease.  CANCER SCREENING   Lung Cancer  Lung cancer screening is recommended for adults 30-82 years old who are at high risk for lung cancer because of a history of smoking.  A yearly low-dose CT scan of the lungs is recommended for people who:  Currently smoke.  Have quit within the past 15 years.  Have at least a 30-pack-year history of smoking. A pack year is smoking an average of one pack of cigarettes a day for 1 year.  Yearly screening should continue until it has been 15 years since you quit.  Yearly screening should stop if you develop a health problem that would prevent you from having lung cancer treatment.  Breast Cancer  Practice breast self-awareness. This means understanding how your breasts normally appear and feel.  It also means doing regular breast self-exams. Let your health care provider know about any changes, no matter how small.  If you are in your 20s or 30s, you should have a clinical breast exam (  CBE) by a health care provider every 1-3 years as part of a regular health exam.  If you are 56 or older, have a CBE every year. Also consider having a breast X-ray (mammogram) every year.  If you have a family history of breast cancer, talk to your health care provider about genetic screening.  If you are at high risk for breast cancer, talk to your health care provider about having an MRI and a mammogram every year.  Breast cancer gene (BRCA) assessment is recommended for women who have family members with BRCA-related cancers.  BRCA-related cancers include:  Breast.  Ovarian.  Tubal.  Peritoneal cancers.  Results of the assessment will determine the need for genetic counseling and BRCA1 and BRCA2 testing. Cervical Cancer Routine pelvic examinations to screen for cervical cancer are no longer recommended for nonpregnant women who are considered low risk for cancer of the pelvic organs (ovaries, uterus, and vagina) and who do not have symptoms. A pelvic examination may be necessary if you have symptoms including those associated with pelvic infections. Ask your health care provider if a screening pelvic exam is right for you.   The Pap test is the screening test for cervical cancer for women who are considered at risk.  If you had a hysterectomy for a problem that was not cancer or a condition that could lead to cancer, then you no longer need Pap tests.  If you are older than 65 years, and you have had normal Pap tests for the past 10 years, you no longer need to have Pap tests.  If you have had past treatment for cervical cancer or a condition that could lead to cancer, you need Pap tests and screening for cancer for at least 20 years after your treatment.  If you no longer get a Pap test, assess your risk factors if they change (such as having a new sexual partner). This can affect whether you should start being screened again.  Some women have medical problems that increase their chance of getting cervical cancer. If this is the case for you, your health care provider may recommend more frequent screening and Pap tests.  The human papillomavirus (HPV) test is another test that may be used for cervical cancer screening. The HPV test looks for the virus that can cause cell changes in the cervix. The cells collected during the Pap test can be tested for HPV.  The HPV test can be used to screen women 39 years of age and older. Getting tested for HPV can extend the interval between normal Pap tests from three to  five years.  An HPV test also should be used to screen women of any age who have unclear Pap test results.  After 53 years of age, women should have HPV testing as often as Pap tests.  Colorectal Cancer  This type of cancer can be detected and often prevented.  Routine colorectal cancer screening usually begins at 53 years of age and continues through 53 years of age.  Your health care provider may recommend screening at an earlier age if you have risk factors for colon cancer.  Your health care provider may also recommend using home test kits to check for hidden blood in the stool.  A small camera at the end of a tube can be used to examine your colon directly (sigmoidoscopy or colonoscopy). This is done to check for the earliest forms of colorectal cancer.  Routine screening usually begins at age 77.  Direct examination of the colon should be repeated every 5-10 years through 53 years of age. However, you may need to be screened more often if early forms of precancerous polyps or small growths are found. Skin Cancer  Check your skin from head to toe regularly.  Tell your health care provider about any new moles or changes in moles, especially if there is a change in a mole's shape or color.  Also tell your health care provider if you have a mole that is larger than the size of a pencil eraser.  Always use sunscreen. Apply sunscreen liberally and repeatedly throughout the day.  Protect yourself by wearing long sleeves, pants, a wide-brimmed hat, and sunglasses whenever you are outside. HEART DISEASE, DIABETES, AND HIGH BLOOD PRESSURE   Have your blood pressure checked at least every 1-2 years. High blood pressure causes heart disease and increases the risk of stroke.  If you are between 76 years and 55 years old, ask your health care provider if you should take aspirin to prevent strokes.  Have regular diabetes screenings. This involves taking a blood sample to check your  fasting blood sugar level.  If you are at a normal weight and have a low risk for diabetes, have this test once every three years after 53 years of age.  If you are overweight and have a high risk for diabetes, consider being tested at a younger age or more often. PREVENTING INFECTION  Hepatitis B  If you have a higher risk for hepatitis B, you should be screened for this virus. You are considered at high risk for hepatitis B if:  You were born in a country where hepatitis B is common. Ask your health care provider which countries are considered high risk.  Your parents were born in a high-risk country, and you have not been immunized against hepatitis B (hepatitis B vaccine).  You have HIV or AIDS.  You use needles to inject street drugs.  You live with someone who has hepatitis B.  You have had sex with someone who has hepatitis B.  You get hemodialysis treatment.  You take certain medicines for conditions, including cancer, organ transplantation, and autoimmune conditions. Hepatitis C  Blood testing is recommended for:  Everyone born from [birth year] through 1964.  Anyone with known risk factors for hepatitis C. Sexually transmitted infections (STIs)  You should be screened for sexually transmitted infections (STIs) including gonorrhea and chlamydia if:  You are sexually active and are younger than 53 years of age.  You are older than 53 years of age and your health care provider tells you that you are at risk for this type of infection.  Your sexual activity has changed since you were last screened and you are at an increased risk for chlamydia or gonorrhea. Ask your health care provider if you are at risk.  If you do not have HIV, but are at risk, it may be recommended that you take a prescription medicine daily to prevent HIV infection. This is called pre-exposure prophylaxis (PrEP). You are considered at risk if:  You are sexually active and do not regularly use condoms or  know the HIV status of your partner(s).  You take drugs by injection.  You are sexually active with a partner who has HIV. Talk with your health care provider about whether you are at high risk of being infected with HIV. If you choose to begin PrEP, you should first be tested for HIV. You should then be  tested every 3 months for as long as you are taking PrEP.  PREGNANCY   If you are premenopausal and you may become pregnant, ask your health care provider about preconception counseling.  If you may become pregnant, take 400 to 800 micrograms (mcg) of folic acid every day.  If you want to prevent pregnancy, talk to your health care provider about birth control (contraception). OSTEOPOROSIS AND MENOPAUSE   Osteoporosis is a disease in which the bones lose minerals and strength with aging. This can result in serious bone fractures. Your risk for osteoporosis can be identified using a bone density scan.  If you are 25 years of age or older, or if you are at risk for osteoporosis and fractures, ask your health care provider if you should be screened.  Ask your health care provider whether you should take a calcium or vitamin D supplement to lower your risk for osteoporosis.  Menopause may have certain physical symptoms and risks.  Hormone replacement therapy may reduce some of these symptoms and risks. Talk to your health care provider about whether hormone replacement therapy is right for you.  HOME CARE INSTRUCTIONS   Schedule regular health, dental, and eye exams.  Stay current with your immunizations.   Do not use any tobacco products including cigarettes, chewing tobacco, or electronic cigarettes.  If you are pregnant, do not drink alcohol.  If you are breastfeeding, limit how much and how often you drink alcohol.  Limit alcohol intake to no more than 1 drink per day for nonpregnant women. One drink equals 12 ounces of beer, 5 ounces of wine, or 1 ounces of hard liquor.  Do  not use street drugs.  Do not share needles.  Ask your health care provider for help if you need support or information about quitting drugs.  Tell your health care provider if you often feel depressed.  Tell your health care provider if you have ever been abused or do not feel safe at home. Document Released: 05/01/2011 Document Revised: 03/02/2014 Document Reviewed: 09/17/2013 [facility] Patient Information 2015 [place], [place]. This information is not intended to replace advice given to you by your health care provider. Make sure you discuss any questions you have with your health care provider.

## 2015-09-16 NOTE — Addendum Note (Signed)
Addended by: Dayna BarkerGARDNER, Habiba Treloar K on: 09/16/2015 03:08 PM   Modules accepted: Orders

## 2015-09-16 NOTE — Progress Notes (Signed)
Sabrina PeakKaren Gallagher [birth year] [id number]        54 y.o.  [id number]  No LMP recorded. Patient is postmenopausal. for annual exam.  Several issues noted below.  Past medical history,surgical history, problem list, medications, allergies, family history and social history were all reviewed and documented as reviewed in the EPIC chart.  ROS:  Performed with pertinent positives and negatives included in the history, assessment and plan.   Additional significant findings :  none   Exam: [facility] person[facility]istant Filed Vitals:   09/16/15 1434  BP: 122/78  Height: 5\' 6"  (1.676 m)  Weight: 200 lb (90.719 kg)   General appearance:  Normal affect, orientation and appearance. Skin: Grossly normal HEENT: Without gross lesions.  No cervical or supraclavicular adenopathy. Thyroid normal.  Lungs:  Clear without wheezing, rales or rhonchi Cardiac: RR, without RMG Abdominal:  Soft, nontender, without masses, guarding, rebound, organomegaly or hernia Breasts:  Examined lying and sitting without masses, retractions, discharge or axillary adenopathy. Pelvic:  Ext/BUS/vagina with mild atrophic changes  Cervix with mild atrophic changes. Pap smear done  Uterus axial to anteverted, normal size, shape and contour, midline and mobile nontender   Adnexa  Without masses or tenderness    Anus and perineum  Normal   Rectovaginal  Normal sphincter tone without palpated masses or tenderness.    Assessment/Plan:  54 y.o. [id number] female for annual exam.   1. Postmenopausal/atrophic genital changes. No significant hot flushes, night sweats, vaginal dryness or any vaginal bleeding. Continue to monitor and report any vaginal bleeding. 2. Osteoporosis. DEXA 2014 T score -2.9. Per 08/29/2013 note patient elected not to be treated. Was to do a one-year short interval study last year but never followed up for this. Strongly recommended she schedule DEXA now a 2 year interval and she agrees to do so. Check vitamin D level  today. 3. Mammography 07/2015. Repeat when due. SBE monthly reviewed. 4. Pap smear 2013. Pap smear done today. No history of significant abnormal Pap smears previously. 5. Colonoscopy 2013. Repeat at their recommended interval. 6. Health maintenance. Patient's undergone recent workup for chest discomfort to include a number of tests. Did not appear to have a lipid profile and I ordered that today along with her vitamin D level. Follow up for DEXA as scheduled otherwise annual follow up.   Dara LordsFONTAINE,TIMOTHY P MD, 3:00 PM 09/16/2015

## 2015-09-17 LAB — URINALYSIS W MICROSCOPIC + REFLEX CULTURE
BACTERIA UA: NONE SEEN [HPF]
BILIRUBIN URINE: NEGATIVE
CRYSTALS: NONE SEEN [HPF]
Casts: NONE SEEN [LPF]
Glucose, UA: NEGATIVE
HGB URINE DIPSTICK: NEGATIVE
KETONES UR: NEGATIVE
Leukocytes, UA: NEGATIVE
Nitrite: NEGATIVE
Protein, ur: NEGATIVE
RBC / HPF: NONE SEEN RBC/HPF (ref <=2)
Specific Gravity, Urine: 1.027 (ref 1.001–1.035)
WBC UA: NONE SEEN WBC/HPF (ref <=5)
Yeast: NONE SEEN [HPF]
pH: 7 (ref 5.0–8.0)

## 2015-09-17 LAB — VITAMIN D 25 HYDROXY (VIT D DEFICIENCY, FRACTURES): VIT D 25 HYDROXY: 38 ng/mL (ref 30–100)

## 2015-09-20 LAB — CYTOLOGY - PAP

## 2015-09-27 ENCOUNTER — Encounter: Payer: Self-pay | Admitting: Gynecology

## 2015-09-27 DIAGNOSIS — R531 Weakness: Secondary | ICD-10-CM | POA: Insufficient documentation

## 2015-09-27 DIAGNOSIS — R202 Paresthesia of skin: Secondary | ICD-10-CM | POA: Insufficient documentation

## 2015-09-27 DIAGNOSIS — Z8719 Personal history of other diseases of the digestive system: Secondary | ICD-10-CM | POA: Insufficient documentation

## 2015-09-27 DIAGNOSIS — Z8739 Personal history of other diseases of the musculoskeletal system and connective tissue: Secondary | ICD-10-CM | POA: Diagnosis not present

## 2015-09-27 DIAGNOSIS — Z8639 Personal history of other endocrine, nutritional and metabolic disease: Secondary | ICD-10-CM | POA: Insufficient documentation

## 2015-09-27 LAB — CBC WITH DIFFERENTIAL/PLATELET
BASOS ABS: 0 10*3/uL (ref 0.0–0.1)
BASOS PCT: 1 %
Eosinophils Absolute: 0.1 10*3/uL (ref 0.0–0.7)
Eosinophils Relative: 1 %
HEMATOCRIT: 39.2 % (ref 36.0–46.0)
Hemoglobin: 12.5 g/dL (ref 12.0–15.0)
LYMPHS PCT: 52 %
Lymphs Abs: 3 10*3/uL (ref 0.7–4.0)
MCH: 27.4 pg (ref 26.0–34.0)
MCHC: 31.9 g/dL (ref 30.0–36.0)
MCV: 86 fL (ref 78.0–100.0)
MONO ABS: 0.4 10*3/uL (ref 0.1–1.0)
Monocytes Relative: 8 %
NEUTROS ABS: 2.2 10*3/uL (ref 1.7–7.7)
NEUTROS PCT: 38 %
PLATELETS: 272 10*3/uL (ref 150–400)
RBC: 4.56 MIL/uL (ref 3.87–5.11)
RDW: 13.9 % (ref 11.5–15.5)
WBC: 5.7 10*3/uL (ref 4.0–10.5)

## 2015-09-27 LAB — COMPREHENSIVE METABOLIC PANEL
ALBUMIN: 3.9 g/dL (ref 3.5–5.0)
ALT: 19 U/L (ref 14–54)
AST: 21 U/L (ref 15–41)
Alkaline Phosphatase: 75 U/L (ref 38–126)
Anion gap: 9 (ref 5–15)
BILIRUBIN TOTAL: 0.2 mg/dL — AB (ref 0.3–1.2)
BUN: 10 mg/dL (ref 6–20)
CHLORIDE: 101 mmol/L (ref 101–111)
CO2: 26 mmol/L (ref 22–32)
CREATININE: 0.91 mg/dL (ref 0.44–1.00)
Calcium: 9.4 mg/dL (ref 8.9–10.3)
GFR calc Af Amer: >60 mL/min (ref >60)
GFR calc non Af Amer: >60 mL/min (ref >60)
GLUCOSE: 106 mg/dL — AB (ref 65–99)
POTASSIUM: 3.8 mmol/L (ref 3.5–5.1)
Sodium: 136 mmol/L (ref 135–145)
Total Protein: 7.2 g/dL (ref 6.5–8.1)

## 2015-09-27 NOTE — ED Notes (Signed)
Pt. reports tingling at legs , arms and hands onset Nov. 2 , 2016 , denies  chest pain or SOB , no fever or chills.

## 2015-09-28 ENCOUNTER — Emergency Department (HOSPITAL_COMMUNITY)
Admission: EM | Admit: 2015-09-28 | Discharge: 2015-09-28 | Disposition: A | Discharge status: Home / Self Care | Payer: 59 | Attending: Emergency Medicine | Admitting: Emergency Medicine

## 2015-09-28 DIAGNOSIS — R202 Paresthesia of skin: Secondary | ICD-10-CM

## 2015-09-28 HISTORY — DX: Abnormal electrocardiogram (ECG) (EKG): R94.31

## 2015-09-28 HISTORY — DX: Shortness of breath: R06.02

## 2015-09-28 HISTORY — DX: Other fatigue: R53.83

## 2015-09-28 HISTORY — DX: Atypical squamous cells cannot exclude high grade squamous intraepithelial lesion on cytologic smear of cervix (ASC-H): R87.611

## 2015-09-28 NOTE — ED Provider Notes (Signed)
CSN: [id number]     Arrival date & time 09/27/15  2055 History  By signing my name below, I, Emmanuella Mensah, attest that this documentation has been prepared under the direction and in the presence of Dione Booze, MD. Electronically Signed: Angelene Giovanni, ED Scribe. 09/28/2015. 2:40 AM.    Chief Complaint  Patient presents with  . Tingling   The history is provided by the patient. No language interpreter was used.   HPI Comments: Sabrina Gallagher is a 54 y.o. female with a hx of osteoporosis who presents to the Emergency Department complaining of gradually worsening tingling in bilateral legs and bilateral arms and fingers onset 1 pm yesterday. She reports associated generalized weakness. She denies any acute SOB, CP, fever or chills. She explains she has had intermittent symptoms of dizziness, lightheadedness, CP, numbness, and SOB onset 09/01/15. She denies that anything specific makes her symptoms worse. No alleviating factors noted. She reports that she has an upcoming appointment with Dr. Tenny Craw, her Cardiologist.   PCP: Dr. Delena Serve  Cardiologist: Dr. Tenny Craw   Past Medical History  Diagnosis Date  . Reflux   . Premature ovarian failure age 54  . Osteoporosis 07/2013    T score -2.9 AP spine  . Pap smear of cervix with ASCUS, cannot exclude HGSIL 08/2015  . Fatigue   . SOB (shortness of breath)   . Chest pain   . Abnormal EKG    History reviewed. No pertinent past surgical history. Family History  Problem Relation Age of Onset  . Colon cancer Father    Social History  Substance Use Topics  . Smoking status: Never Smoker   . Smokeless tobacco: None  . Alcohol Use: No   OB History    Gravida Para Term Preterm AB TAB SAB Ectopic Multiple Living   Review of Systems  Constitutional: Negative for fever and chills.  Respiratory: Negative for shortness of breath.   Cardiovascular: Negative for chest pain.  Neurological: Positive for numbness.  All other  systems reviewed and are negative.     Allergies  Review of patient's allergies indicates no known allergies.  Home Medications   Prior to Admission medications   Not on File   BP 151/82 mmHg  Pulse 62  Temp(Src) 98 F (36.7 C) (Oral)  Resp 20  SpO2 97% Physical Exam  Constitutional: She is oriented to person, place, and time. She appears well-developed and well-nourished. No distress.  HENT:  Head: Normocephalic and atraumatic.  Eyes: EOM are normal. Pupils are equal, round, and reactive to light.  Neck: Normal range of motion. Neck supple. No JVD present.  No carotid bruit  Cardiovascular: Normal rate, regular rhythm and normal heart sounds.   No murmur heard. Pulmonary/Chest: Effort normal and breath sounds normal. She has no wheezes. She has no rales. She exhibits no tenderness.  Abdominal: Bowel sounds are normal. She exhibits no distension and no mass. There is no tenderness.  Musculoskeletal: Normal range of motion. She exhibits no edema.  Muscle strength 5/5 in bilateral arms and legs Sensation normal throughout  Lymphadenopathy:    She has no cervical adenopathy.  Neurological: She is alert and oriented to person, place, and time. No cranial nerve deficit. She exhibits normal muscle tone. Coordination normal.  Skin: Skin is warm and dry. No rash noted.  Psychiatric: She has a normal mood and affect. Her behavior is normal. Judgment and thought content normal.  Nursing note and vitals reviewed.   ED Course  Procedures (including critical care time) DIAGNOSTIC STUDIES: Oxygen Saturation is 97% on RA, adequate by my interpretation.    COORDINATION OF CARE:  2:37 AM- Pt advised of plan for treatment and pt agrees. Will provide resources for follow up with Neurology. Recommended to honor appointment with Dr. Tenny Crawoss.   Labs Review Results for orders placed or performed during the hospital encounter of 09/28/15  CBC with Differential  Result Value Ref Range   WBC  5.7 4.0 - 10.5 K/uL   RBC 4.56 3.87 - 5.11 MIL/uL   Hemoglobin 12.5 12.0 - 15.0 g/dL   HCT 65.739.2 84.636.0 - 96.246.0 %   MCV 86.0 78.0 - 100.0 fL   MCH 27.4 26.0 - 34.0 pg   MCHC 31.9 30.0 - 36.0 g/dL   RDW 95.213.9 84.111.5 - 32.415.5 %   Platelets 272 150 - 400 K/uL   Neutrophils Relative % 38 %   Neutro Abs 2.2 1.7 - 7.7 K/uL   Lymphocytes Relative 52 %   Lymphs Abs 3.0 0.7 - 4.0 K/uL   Monocytes Relative 8 %   Monocytes Absolute 0.4 0.1 - 1.0 K/uL   Eosinophils Relative 1 %   Eosinophils Absolute 0.1 0.0 - 0.7 K/uL   Basophils Relative 1 %   Basophils Absolute 0.0 0.0 - 0.1 K/uL  Comprehensive metabolic panel  Result Value Ref Range   Sodium 136 135 - 145 mmol/L   Potassium 3.8 3.5 - 5.1 mmol/L   Chloride 101 101 - 111 mmol/L   CO2 26 22 - 32 mmol/L   Glucose, Bld 106 (H) 65 - 99 mg/dL   BUN 10 6 - 20 mg/dL   Creatinine, Ser 4.010.91 0.44 - 1.00 mg/dL   Calcium 9.4 8.9 - 02.710.3 mg/dL   Total Protein 7.2 6.5 - 8.1 g/dL   Albumin 3.9 3.5 - 5.0 g/dL   AST 21 15 - 41 U/L   ALT 19 14 - 54 U/L   Alkaline Phosphatase 75 38 - 126 U/L   Total Bilirubin 0.2 (L) 0.3 - 1.2 mg/dL   GFR calc non Af Amer >60 >60 mL/min   GFR calc Af Amer >60 >60 mL/min   Anion gap 9 5 - 15   Dione Boozeavid Rubin Dais, MD has personally reviewed and evaluated these lab results as part of his medical decision-making.   EKG Interpretation   Date/Time:  Monday September 27 2015 21:29:50 EST Ventricular Rate:  64 PR Interval:  142 QRS Duration: 86 QT Interval:  384 QTC Calculation: 396 R Axis:   53 Text Interpretation:  Normal sinus rhythm Nonspecific T wave abnormality  Abnormal ECG When compared with ECG of 09/01/2015, T wave abnormality is  less pronounced Confirmed by [facility]  MD, Marabelle Cushman ([id number]) on 09/28/2015  2:21:02 AM      MDM   Final diagnoses:  Paresthesias    Subjective numbness of uncertain cause. No objective sensory loss on neurologic exam. Old records are reviewed and she does have an ED visit for chest pain 4 weeks  ago and apparently, some of the symptoms that she is having today have been related to her chest pain. She has a follow-up appointment cardiology scheduled for there is a, December 1. She is to keep that appointment and she is also being referred to neurology for further evaluation.  I personally performed the services described in this documentation, which was scribed in my presence. The recorded information has been reviewed and is  accurate.      Dione Booze, MD 09/28/15 [phone]

## 2015-09-28 NOTE — Discharge Instructions (Signed)
See Dr. Tenny Crawoss on Thursday, December 1 as scheduled. Make an appointment to see the neurologist.   Paresthesia Paresthesia is an abnormal burning or prickling sensation. This sensation is generally felt in the hands, arms, legs, or feet. However, it may occur in any part of the body. Usually, it is not painful. The feeling may be described as:  Tingling or numbness.  Pins and needles.  Skin crawling.  Buzzing.  Limbs falling asleep.  Itching. Most people experience temporary (transient) paresthesia at some time in their lives. Paresthesia may occur when you breathe too quickly (hyperventilation). It can also occur without any apparent cause. Commonly, paresthesia occurs when pressure is placed on a nerve. The sensation quickly goes away after the pressure is removed. For some people, however, paresthesia is a long-lasting (chronic) condition that is caused by an underlying disorder. If you continue to have paresthesia, you may need further medical evaluation. HOME CARE INSTRUCTIONS Watch your condition for any changes. Taking the following actions may help to lessen any discomfort that you are feeling:  Avoid drinking alcohol.  Try acupuncture or massage to help relieve your symptoms.  Keep all follow-up visits as directed by your health care provider. This is important. SEEK MEDICAL CARE IF:  You continue to have episodes of paresthesia.  Your burning or prickling feeling gets worse when you walk.  You have pain, cramps, or dizziness.  You develop a rash. SEEK IMMEDIATE MEDICAL CARE IF:  You feel weak.  You have trouble walking or moving.  You have problems with speech, understanding, or vision.  You feel confused.  You cannot control your bladder or bowel movements.  You have numbness after an injury.  You faint.   This information is not intended to replace advice given to you by your health care provider. Make sure you discuss any questions you have with your  health care provider.   Document Released: 10/06/2002 Document Revised: 03/02/2015 Document Reviewed: 10/12/2014 Elsevier Interactive Patient Education [employer]2016 Elsevier Inc.

## 2015-09-30 ENCOUNTER — Ambulatory Visit (INDEPENDENT_AMBULATORY_CARE_PROVIDER_SITE_OTHER): Payer: 59 | Admitting: Internal Medicine

## 2015-09-30 ENCOUNTER — Encounter: Payer: Self-pay | Admitting: Internal Medicine

## 2015-09-30 VITALS — BP 130/82 | HR 86 | Ht 65.0 in | Wt 201.0 lb

## 2015-09-30 DIAGNOSIS — R0789 Other chest pain: Secondary | ICD-10-CM

## 2015-09-30 DIAGNOSIS — R0602 Shortness of breath: Secondary | ICD-10-CM

## 2015-09-30 NOTE — Progress Notes (Signed)
Cardiology Office Note   Date:  09/30/2015   ID:  Sabrina Gallagher, DOB 03/10/1961, MRN [id number]  PCP:  Jarrett SohoWharton, Courtney, PA-C  Cardiologist:   Dietrich PatesPaula Josphine Laffey, MD   Chief Complaint  Patient presents with  . New Patient (Initial Visit)  . Abnormal ECG      History of Present Illness: Sabrina Gallagher is a 54 y.o. female with a history of abnormal EKG and CP  Referred by Lenard LanceK Wharton.  / Dr Zachery DauerBarnes Hx of chest tightness, SOB  Setn to ER  Neg trop.  Labs normal   Hx of fatigue    Intermitt  Started 09/01/15  First visit to ER   Wake up out of sleep  Sudden  LIght headed  SOB Chest tightness   OVer next two wks chest tightness has gotten better  Still tingling in arms and legs   Last spell of chest tightness 3 wks ago.   Work is Restaurant manager, fast foodrel sedentary  Moves thorughout day up and around  No chest tightness or tingling  Usually it occurs at night when settling down.    Pt admits to starting a new job that is very stressful  Symptoms started after she started this position   Pt also admits that she does not have problems on the weekend    No current outpatient prescriptions on file.   No current facility-administered medications for this visit.    Allergies:   Review of patient's allergies indicates no known allergies.   Past Medical History  Diagnosis Date  . Reflux   . Premature ovarian failure age 54  . Osteoporosis 07/2013    T score -2.9 AP spine  . Pap smear of cervix with ASCUS, cannot exclude HGSIL 08/2015  . Fatigue   . SOB (shortness of breath)   . Chest pain   . Abnormal EKG     No past surgical history on file.   Social History:  The patient  reports that she has never smoked. She does not have any smokeless tobacco history on file. She reports that she does not drink alcohol or use illicit drugs.   Family History:  The patient's family history includes Colon cancer in her father.    ROS:  Please see the history of present illness. All other systems are reviewed and  Negative  to the above problem except as noted.    PHYSICAL EXAM: VS:  BP 130/82 mmHg  Pulse 86  Ht 5\' 5"  (1.651 m)  Wt 91.173 kg (201 lb)  BMI 33.45 kg/m2  GEN: Well nourished, well developed, in no acute distress HEENT: normal Neck: no JVD, carotid bruits, or masses Cardiac: RRR; no murmurs, rubs, or gallops,no edema  Respiratory:  clear to auscultation bilaterally, normal work of breathing GI: soft, nontender, nondistended, + BS  No hepatomegaly  MS: no deformity Moving all extremities   Skin: warm and dry, no rash Neuro:  Strength and sensation are intact Psych: euthymic mood, full affect   EKG:  EKG is not ordered today.  Previous EKG:  SR 64 bpm  T wave inversion III (11/28)   Lipid Panel    Component Value Date/Time   CHOL 197 09/16/2015 1503   TRIG 115 09/16/2015 1503   HDL 47 09/16/2015 1503   CHOLHDL 4.2 09/16/2015 1503   VLDL 23 09/16/2015 1503   LDLCALC 127 09/16/2015 1503      Wt Readings from Last 3 Encounters:  09/30/15 91.173 kg (201 lb)  09/16/15 90.719 kg (200  lb)  04/12/15 90.719 kg (200 lb)      ASSESSMENT AND PLAN:  1  CP  I am not convinced that the pt's CP is cardiac  I think it may be GI in origin, related to increased stresses at work  EKG is nonspecific  She does not have symptoms when she pushes herself physically or on the weekend.  I would recomm echo to define LV function and regional wall motion F?U based on this  Recomm stress reduction.      Signed, Dietrich Pates, MD  09/30/2015 3:02 PM    [facility] [address] [place], [place], [place]  [address] Phone: [phone]; Fax: [phone]

## 2015-09-30 NOTE — Patient Instructions (Signed)

## 2015-10-14 ENCOUNTER — Ambulatory Visit (INDEPENDENT_AMBULATORY_CARE_PROVIDER_SITE_OTHER): Payer: 59

## 2015-10-14 ENCOUNTER — Other Ambulatory Visit: Payer: Self-pay | Admitting: Gynecology

## 2015-10-14 ENCOUNTER — Telehealth: Payer: Self-pay | Admitting: Gynecology

## 2015-10-14 DIAGNOSIS — M810 Age-related osteoporosis without current pathological fracture: Secondary | ICD-10-CM

## 2015-10-14 DIAGNOSIS — Z01419 Encounter for gynecological examination (general) (routine) without abnormal findings: Secondary | ICD-10-CM

## 2015-10-14 NOTE — Telephone Encounter (Signed)
Tell patient that her bone density shows stable measurements at the spine and actually slightly improved at the hips both sides. She still has an indication for medication treatment based on the diagnosis of osteoporosis. We discussed this before she elected not to be treated. If she does change her mind and wants to discuss treatment options then recommend office visit otherwise I would recommend repeat bone density in 2 years as measurements are stable or improved from prior bone density. Her vitamin D level was 38 and I would recommend supplementing with 1000 units OTC vitamin D daily to see if we cannot get it a little higher into the normal range.

## 2015-10-15 NOTE — Telephone Encounter (Signed)
I would add another 1000 of vitamin D which she is taking now.  I would take it at a different time than she takes her Nexium

## 2015-10-15 NOTE — Telephone Encounter (Signed)
Pt aware with the below. 

## 2015-10-15 NOTE — Telephone Encounter (Signed)
Pt informed with the below note, states she is taking 2000 units of vitamin d 1 pill daily. Pt also taking Nexium asked if okay to take with OTC vitamin d? Please advise

## 2015-10-22 ENCOUNTER — Other Ambulatory Visit: Payer: Self-pay

## 2015-10-22 ENCOUNTER — Ambulatory Visit (HOSPITAL_COMMUNITY): Payer: 59 | Attending: Internal Medicine

## 2015-10-22 DIAGNOSIS — K219 Gastro-esophageal reflux disease without esophagitis: Secondary | ICD-10-CM | POA: Insufficient documentation

## 2015-10-22 DIAGNOSIS — R0602 Shortness of breath: Secondary | ICD-10-CM | POA: Diagnosis not present

## 2015-10-22 DIAGNOSIS — I340 Nonrheumatic mitral (valve) insufficiency: Secondary | ICD-10-CM | POA: Insufficient documentation

## 2015-10-22 DIAGNOSIS — R0600 Dyspnea, unspecified: Secondary | ICD-10-CM | POA: Diagnosis present

## 2015-10-22 DIAGNOSIS — I517 Cardiomegaly: Secondary | ICD-10-CM | POA: Diagnosis not present

## 2015-10-22 DIAGNOSIS — I5189 Other ill-defined heart diseases: Secondary | ICD-10-CM | POA: Diagnosis not present

## 2015-10-22 DIAGNOSIS — E785 Hyperlipidemia, unspecified: Secondary | ICD-10-CM | POA: Insufficient documentation

## 2015-10-22 DIAGNOSIS — I071 Rheumatic tricuspid insufficiency: Secondary | ICD-10-CM | POA: Diagnosis not present

## 2015-10-22 DIAGNOSIS — R0789 Other chest pain: Secondary | ICD-10-CM

## 2015-11-05 ENCOUNTER — Encounter: Payer: Self-pay | Admitting: Gynecology

## 2015-11-05 ENCOUNTER — Ambulatory Visit (INDEPENDENT_AMBULATORY_CARE_PROVIDER_SITE_OTHER): Payer: 59 | Admitting: Gynecology

## 2015-11-05 VITALS — BP 122/76

## 2015-11-05 DIAGNOSIS — R87611 Atypical squamous cells cannot exclude high grade squamous intraepithelial lesion on cytologic smear of cervix (ASC-H): Secondary | ICD-10-CM

## 2015-11-05 NOTE — Progress Notes (Signed)
Sabrina PeakKaren Gallagher Apr 12, 1961 [id number]        55 y.o.  [id number] Presents with first abnormal Pap smear showing ascus cannot rule out high-grade lesion.  Negative HPV  Past medical history,surgical history, problem list, medications, allergies, family history and social history were all reviewed and documented in the EPIC chart.  Directed ROS with pertinent positives and negatives documented in the history of present illness/assessment and plan.  Exam: Sabrina PortelaKim Gallagher assistant Filed Vitals:   11/05/15 1529  BP: 122/76   General appearance:  Normal External BUS vagina normal. Cervix grossly normal. Uterus normal size midline mobile nontender. Adnexa without masses or tenderness.  Colposcopy after acetic acid cleanse is adequate using the endocervical speculum. Acetowhite versus thick metaplasia 12:00. Representative biopsy taken. ECC performed. Patient tolerated well. Physical Exam  Genitourinary:       Assessment/Plan:  55 y.o. [id number] with the first abnormal Pap smear showing ascus cannot rule out high-grade lesion, negative HPV. Possible area of acetowhite change at 12:00 versus thickened metaplasia. Biopsy taken. ECC performed. I reviewed with the patient the whole issue of dysplasia, high-grade/low-grade, progression/regression. Patient will follow up for biopsy results.  If negative plan follow up Pap smear one year if otherwise then triage based upon results    Dara LordsFONTAINE,Sabrina Sanderlin P MD, 3:48 PM 11/05/2015

## 2015-11-05 NOTE — Patient Instructions (Signed)
Office will call you with biopsy results 

## 2015-11-10 ENCOUNTER — Encounter: Payer: Self-pay | Admitting: Gynecology

## 2016-01-31 ENCOUNTER — Encounter: Payer: Self-pay | Admitting: Neurology

## 2016-01-31 ENCOUNTER — Ambulatory Visit (INDEPENDENT_AMBULATORY_CARE_PROVIDER_SITE_OTHER): Payer: 59 | Admitting: Neurology

## 2016-01-31 VITALS — BP 115/73 | HR 68 | Ht 65.0 in | Wt 185.2 lb

## 2016-01-31 DIAGNOSIS — E538 Deficiency of other specified B group vitamins: Secondary | ICD-10-CM | POA: Insufficient documentation

## 2016-01-31 DIAGNOSIS — G629 Polyneuropathy, unspecified: Secondary | ICD-10-CM | POA: Diagnosis not present

## 2016-01-31 DIAGNOSIS — R7303 Prediabetes: Secondary | ICD-10-CM | POA: Diagnosis not present

## 2016-01-31 NOTE — Progress Notes (Addendum)
[facility]    Provider:  Dr Lucia Gaskins Referring Provider: Jarrett Soho, PA-C Primary Care Physician:  Jarrett Soho, PA-C  CC:  Tingling  HPI:  Sabrina Gallagher is a 55 y.o. female here as a referral from Dr. Delena Serve for tingling in the arms and the legs. Past medical history of B12 deficiency, prediabetes, premature ovarian failure and osteoporosis, hyperlipidemia, obesity. Symptoms Started in November with tingling in the legs and arms. She went to the ED and to the primary. It was painful, accompanied by SOB and she felt like she was going to pass out. Thought it was cardiac in nature. She was evaluated by cardiology and everything was fine. Symptoms were precipitated by stress in her life,  she got a promotion in November, then everything spiraled out of control, she was trying to do too much and was eating horribly and not sleeping well.  Tingling was in the top of the feet and in the calfs. Early on it was in the fingers then that resolved and just left the feet. No burning or numbness just tingling. Heat exacerbated her symptoms. Worse with over exertion and walking and exercising also positional in nature. She denied any other associated, No weakness, no headache, no dysarthria, no vision changes, no dysphagia. B12 was found to be low normal and now taking a B complex vitamin. That is helping. She is not as fatigued now. She feels better.She is exercising more. . Never had any neurologic issues in her life previous to this. She has been reasonable healthy. Recently she has been more conscious of what she has been eating.  She has difficulty with things on her feet still. Her hgba1c was recently 6.1. She will be following up in gastroenterology as well as chest pain was thought to be GI related per cardiology.   Reviewed notes, labs and imaging from outside physicians, which showed: Patient was seen in the emergency room in November 2016 reporting tingling in the legs arms  and hands with onset November. She reported the symptoms gradually worsening with onset day before. She also reported associated generalized weakness, denies shortness of breath, chest pain, fevers or chills. She also described dizziness, lightheadedness, chest pain, numbness and shortness of breath for the past several weeks. Neurologic exam in the emergency room was nonfocal. EKG showed normal sinus rhythm with nonspecific T-wave abnormality. QTc was 396 (personally reviewed tracing and findings are nonspecific). She was discharge with follow-up with cardiology. She was evaluated by cardiology in December 2016 who thought that her symptoms were GI origin or related to increased stress at work EKG nonspecific. They recommended an echocardiogram and stress reduction.  Notes from [place] show that hemoglobin A1c was 6.1, B12 306, folate 14, CBC unremarkable, TSH 1.57. Vitamin D 25 OH 37.  Chest x-ray 09/01/2015,: No radiographic evidence of acute cardiopulmonary disease.    Review of Systems: Patient complains of symptoms per HPI as well as the following symptoms: decreased energy, change in appetite, snorng, numbness, feeling hot, SOB, weight lossm, fatigue. Pertinent negatives per HPI. All others negative.   Social History   Social History  . Marital Status: Single    Spouse Name: N/A  . Number of Children: 2  . Years of Education: 16   Occupational History  . 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 Smoking status: Never Smoker   . Smokeless tobacco: Not on file  . Alcohol Use: No  . Drug Use: No  . Sexual  Activity: Yes    Birth Control/ Protection: Post-menopausal     Comment: 1st intercourse 55 yo-Fewer than 5 partners   Other Topics Concern  . Not on file   Social History Narrative   Lives alone   Caffeine use: rare     Family History  Problem Relation Age of Onset  . Colon cancer Father   . Neurofibromatosis Neg Hx     Past Medical History    Diagnosis Date  . Reflux   . Premature ovarian failure age 76  . Osteoporosis 07/2013    T score -2.9 AP spine  . Pap smear of cervix with ASCUS, cannot exclude HGSIL 08/2015    colposcopy biopsy 12:00 with LGSIL negative ECC  . Fatigue   . SOB (shortness of breath)   . Chest pain   . Abnormal EKG   . Diabetes mellitus without complication (HCC)   . Neuropathy [facility])     Past Surgical History  Procedure Laterality Date  . No surgical history      No current outpatient prescriptions on file.   No current facility-administered medications for this visit.    Allergies as of 01/31/2016  . (No Known Allergies)    Vitals: BP 115/73 mmHg  Pulse 68  Ht  (1.651 m)  Wt 185 lb 3.2 oz (84.006 kg)  BMI 30.82 kg/m2 Last Weight:  Wt Readings from Last 1 Encounters:  01/31/16 185 lb 3.2 oz (84.006 kg)   Last Height:   Ht Readings from Last 1 Encounters:  01/31/16  (1.651 m)    Physical exam: Exam: Gen: NAD, conversant, well nourised, obese, well groomed                     CV: RRR, no MRG. No Carotid Bruits. No peripheral edema, warm, nontender Eyes: Conjunctivae clear without exudates or hemorrhage  Neuro: Detailed Neurologic Exam  Speech:    Speech is normal; fluent and spontaneous with normal comprehension.  Cognition:    The patient is oriented to person, place, and time;     recent and remote memory intact;     language fluent;     normal attention, concentration,     fund of knowledge Cranial Nerves:    The pupils are equal, round, and reactive to light. The fundi are normal and spontaneous venous pulsations are present. Visual fields are full to finger confrontation. Extraocular movements are intact. Trigeminal sensation is intact and the muscles of mastication are normal. The face is symmetric. The palate elevates in the midline. Hearing intact. Voice is normal. Shoulder shrug is normal. The tongue has normal motion without fasciculations.    Coordination:    Normal finger to nose and heel to shin. Normal rapid alternating movements.   Gait:    Heel-toe and tandem gait are normal.   Motor Observation:    No asymmetry, no atrophy, and no involuntary movements noted. Tone:    Normal muscle tone.    Posture:    Posture is normal. normal erect    Strength:    Strength is V/V in the upper and lower limbs.      Sensation: intact to LT. Decreased distally in the LE to pin prick and temp. Intact to vibration and proprioception.     Reflex Exam:  DTR's:    Deep tendon reflexes in the upper and lower extremities are normal bilaterally.   Toes:    The toes are downgoing bilaterally.   Clonus:  Clonus is absent.    Assessment/Plan:   55 y.o. female here as a referral from Dr. Delena ServeWharton for tingling in the arms and the legs. Past medical history of B12 deficiency, prediabetes, premature ovarian failure and osteoporosis, hyperlipidemia, obesity. Symptoms Started in November with tingling in the legs and arms. She has distal decrease in pain and temperature consistent with a small fiber neuropathy. Risk factors include prediabetes and B12 deficiency. Patient has improved since taking B12 supplements. Discussed small fiber neuropathy with patient. Continue B12 supplements. Control glucose. Will complete a serum neuropathy panel today.  CC: Jarrett SohoWharton, Courtney, PA-C  Naomie DeanAntonia Morgann Woodburn, MD  [facility] [address] [place], [place] [id number]  Phone [phone] Fax [phone]

## 2016-01-31 NOTE — Patient Instructions (Signed)
Remember to drink plenty of fluid, eat healthy meals and do not skip any meals. Try to eat protein with a every meal and eat a healthy snack such as fruit or nuts in between meals. Try to keep a regular sleep-wake schedule and try to exercise daily, particularly in the form of walking, 20-30 minutes a day, if you can.   As far as your medications are concerned, I would like to suggest: continue B complex supplementation. Vit D supplementation.   As far as diagnostic testing: labs  I would like to see you back as needed, sooner if we need to. Please call us with any interim questions, concerns, problems, updates or refill requests.   Our phone number is [phone]. We also have an after hours call service for urgent matters and there is a physician on-call for urgent questions. For any emergencies you know to call 911 or go to the nearest emergency room

## 2016-02-02 LAB — B. BURGDORFI ANTIBODIES: Lyme IgG/IgM Ab: <0.91 {ISR} (ref 0.00–0.90)

## 2016-02-02 LAB — ANA COMPREHENSIVE PANEL
Centromere Ab Screen: <0.2 AI (ref 0.0–0.9)
Chromatin Ab SerPl-aCnc: <0.2 AI (ref 0.0–0.9)
ENA RNP AB: 0.3 AI (ref 0.0–0.9)
Scleroderma SCL-70: <0.2 AI (ref 0.0–0.9)
dsDNA Ab: <1 IU/mL (ref 0–9)

## 2016-02-02 LAB — PAN-ANCA
ANCA Proteinase 3: <3.5 U/mL (ref 0.0–3.5)
Atypical pANCA: <1:20 {titer}
C-ANCA: <1:20 {titer}
Myeloperoxidase Ab: <9 U/mL (ref 0.0–9.0)
P-ANCA: <1:20 {titer}

## 2016-02-02 LAB — RHEUMATOID FACTOR

## 2016-02-02 LAB — ANA: ANA Titer 1: NEGATIVE

## 2016-02-02 LAB — RPR: RPR: NONREACTIVE

## 2016-02-02 LAB — TISSUE TRANSGLUTAMINASE, IGA

## 2016-02-02 LAB — HEAVY METALS, BLOOD
Arsenic: 8 ug/L (ref 2–23)
Lead, Blood: NOT DETECTED ug/dL (ref 0–19)
Mercury: 1.2 ug/L (ref 0.0–14.9)

## 2016-02-02 LAB — GLIADIN ANTIBODIES, SERUM
ANTIGLIADIN ABS, IGA: 3 U (ref 0–19)
GLIADIN IGG: 3 U (ref 0–19)

## 2016-02-02 LAB — HEPATITIS C ANTIBODY

## 2016-02-02 LAB — ANGIOTENSIN CONVERTING ENZYME: ANGIO CONVERT ENZYME: 18 U/L (ref 14–82)

## 2016-02-02 LAB — HIV ANTIBODY (ROUTINE TESTING W REFLEX): HIV SCREEN 4TH GENERATION: NONREACTIVE

## 2016-02-03 ENCOUNTER — Telehealth: Payer: Self-pay | Admitting: *Deleted

## 2016-02-03 NOTE — Telephone Encounter (Signed)
-----   Message from Anson FretAntonia B Ahern, MD sent at 02/02/2016  6:27 PM EDT ----- Al labs normal thanks

## 2016-02-03 NOTE — Telephone Encounter (Signed)
LVM for pt to call about results. Gave [facility] phone number. Ok to inform pt labs normal per Dr Ahern. 

## 2016-02-08 NOTE — Telephone Encounter (Signed)
LVM about normal labs per Dr Lucia Gaskinsahern. Gave [facility] phone number if she has further questions.

## 2016-02-25 ENCOUNTER — Encounter (HOSPITAL_COMMUNITY): Payer: Self-pay

## 2016-02-25 ENCOUNTER — Emergency Department (HOSPITAL_COMMUNITY)
Admission: EM | Admit: 2016-02-25 | Discharge: 2016-02-25 | Disposition: A | Discharge status: Home / Self Care | Payer: 59 | Attending: Emergency Medicine | Admitting: Emergency Medicine

## 2016-02-25 DIAGNOSIS — Z79899 Other long term (current) drug therapy: Secondary | ICD-10-CM | POA: Diagnosis not present

## 2016-02-25 DIAGNOSIS — M810 Age-related osteoporosis without current pathological fracture: Secondary | ICD-10-CM | POA: Diagnosis not present

## 2016-02-25 DIAGNOSIS — M79605 Pain in left leg: Secondary | ICD-10-CM | POA: Insufficient documentation

## 2016-02-25 DIAGNOSIS — M79604 Pain in right leg: Secondary | ICD-10-CM | POA: Diagnosis present

## 2016-02-25 DIAGNOSIS — G629 Polyneuropathy, unspecified: Secondary | ICD-10-CM | POA: Diagnosis not present

## 2016-02-25 DIAGNOSIS — M79606 Pain in leg, unspecified: Secondary | ICD-10-CM

## 2016-02-25 DIAGNOSIS — E1140 Type 2 diabetes mellitus with diabetic neuropathy, unspecified: Secondary | ICD-10-CM | POA: Diagnosis not present

## 2016-02-25 LAB — D-DIMER, QUANTITATIVE (NOT AT ARMC)

## 2016-02-25 MED ORDER — OXYCODONE-ACETAMINOPHEN 5-325 MG PO TABS
1.0000 | ORAL_TABLET | Freq: Once | ORAL | Status: AC
  Administered 2016-02-25: 1 via ORAL
  Filled 2016-02-25: qty 1

## 2016-02-25 NOTE — Discharge Instructions (Signed)
Neuropathic Pain Neuropathic pain is pain caused by damage to the nerves that are responsible for certain sensations in your body (sensory nerves). The pain can be caused by damage to:   The sensory nerves that send signals to your spinal cord and brain (peripheral nervous system).  The sensory nerves in your brain or spinal cord (central nervous system). Neuropathic pain can make you more sensitive to pain. What would be a minor sensation for most people may feel very painful if you have neuropathic pain. This is usually a long-term condition that can be difficult to treat. The type of pain can differ from person to person. It may start suddenly (acute), or it may develop slowly and last for a long time (chronic). Neuropathic pain may come and go as damaged nerves heal or may stay at the same level for years. It often causes emotional distress, loss of sleep, and a lower quality of life. CAUSES  The most common cause of damage to a sensory nerve is diabetes. Many other diseases and conditions can also cause neuropathic pain. Causes of neuropathic pain can be classified as:  Toxic. Many drugs and chemicals can cause toxic damage. The most common cause of toxic neuropathic pain is damage from drug treatment for cancer (chemotherapy).  Metabolic. This type of pain can happen when a disease causes imbalances that damage nerves. Diabetes is the most common of these diseases. Vitamin B deficiency caused by long-term alcohol abuse is another common cause.  Traumatic. Any injury that cuts, crushes, or stretches a nerve can cause damage and pain. A common example is feeling pain after losing an arm or leg (phantom limb pain).  Compression-related. If a sensory nerve gets trapped or compressed for a long period of time, the blood supply to the nerve can be cut off.  Vascular. Many blood vessel diseases can cause neuropathic pain by decreasing blood supply and oxygen to nerves.  Autoimmune. This type of  pain results from diseases in which the body's defense system mistakenly attacks sensory nerves. Examples of autoimmune diseases that can cause neuropathic pain include lupus and multiple sclerosis.  Infectious. Many types of viral infections can damage sensory nerves and cause pain. Shingles infection is a common cause of this type of pain.  Inherited. Neuropathic pain can be a symptom of many diseases that are passed down through families (genetic). SIGNS AND SYMPTOMS  The main symptom is pain. Neuropathic pain is often described as:  Burning.  Shock-like.  Stinging.  Hot or cold.  Itching. DIAGNOSIS  No single test can diagnose neuropathic pain. Your health care provider will do a physical exam and ask you about your pain. You may use a pain scale to describe how bad your pain is. You may also have tests to see if you have a high sensitivity to pain and to help find the cause and location of any sensory nerve damage. These tests may include:  Imaging studies, such as:  X-rays.  CT scan.  MRI.  Nerve conduction studies to test how well nerve signals travel through your sensory nerves (electrodiagnostic testing).  Stimulating your sensory nerves through electrodes on your skin and measuring the response in your spinal cord and brain (somatosensory evoked potentials). TREATMENT  Treatment for neuropathic pain may change over time. You may need to try different treatment options or a combination of treatments. Some options include:  Over-the-counter pain relievers.  Prescription medicines. Some medicines used to treat other conditions may also help neuropathic pain. These  include medicines to:  Control seizures (anticonvulsants).  Relieve depression (antidepressants).  Prescription-strength pain relievers (narcotics). These are usually used when other pain relievers do not help.  Transcutaneous nerve stimulation (TENS). This uses electrical currents to block painful nerve  signals. The treatment is painless.  Topical and local anesthetics. These are medicines that numb the nerves. They can be injected as a nerve block or applied to the skin.  Alternative treatments, such as:  Acupuncture.  Meditation.  Massage.  Physical therapy.  Pain management programs.  Counseling. HOME CARE INSTRUCTIONS  Learn as much as you can about your condition.  Take medicines only as directed by your health care provider.  Work closely with all your health care providers to find what works best for you.  Have a good support system at home.  Consider joining a chronic pain support group. SEEK MEDICAL CARE IF:  Your pain treatments are not helping.  You are having side effects from your medicines.  You are struggling with fatigue, mood changes, depression, or anxiety.   This information is not intended to replace advice given to you by your health care provider. Make sure you discuss any questions you have with your health care provider.   Document Released: 07/13/2004 Document Revised: 11/06/2014 Document Reviewed: 03/26/2014 Elsevier Interactive Patient Education 2016 [employer] Gabapentin capsules or tablets What is this medicine? GABAPENTIN (GA ba pen tin) is used to control partial seizures in adults with epilepsy. It is also used to treat certain types of nerve pain. This medicine may be used for other purposes; ask your health care provider or pharmacist if you have questions. What should I tell my health care provider before I take this medicine? They need to know if you have any of these conditions: -kidney disease -suicidal thoughts, plans, or attempt; a previous suicide attempt by you or a family member -an unusual or allergic reaction to gabapentin, other medicines, foods, dyes, or preservatives -pregnant or trying to get pregnant -breast-feeding How should I use this medicine? Take this medicine by mouth with a glass of water. Follow the  directions on the prescription label. You can take it with or without food. If it upsets your stomach, take it with food.Take your medicine at regular intervals. Do not take it more often than directed. Do not stop taking except on your doctor's advice. If you are directed to break the 600 or 800 mg tablets in half as part of your dose, the extra half tablet should be used for the next dose. If you have not used the extra half tablet within 28 days, it should be thrown away. A special MedGuide will be given to you by the pharmacist with each prescription and refill. Be sure to read this information carefully each time. Talk to your pediatrician regarding the use of this medicine in children. Special care may be needed. Overdosage: If you think you have taken too much of this medicine contact a poison control center or emergency room at once. NOTE: This medicine is only for you. Do not share this medicine with others. What if I miss a dose? If you miss a dose, take it as soon as you can. If it is almost time for your next dose, take only that dose. Do not take double or extra doses. What may interact with this medicine? Do not take this medicine with any of the following medications: -other gabapentin products This medicine may also interact with the following medications: -alcohol -antacids -antihistamines for  allergy, cough and cold -certain medicines for anxiety or sleep -certain medicines for depression or psychotic disturbances -homatropine; hydrocodone -naproxen -narcotic medicines (opiates) for pain -phenothiazines like chlorpromazine, mesoridazine, prochlorperazine, thioridazine This list may not describe all possible interactions. Give your health care provider a list of all the medicines, herbs, non-prescription drugs, or dietary supplements you use. Also tell them if you smoke, drink alcohol, or use illegal drugs. Some items may interact with your medicine. What should I watch for  while using this medicine? Visit your doctor or health care professional for regular checks on your progress. You may want to keep a record at home of how you feel your condition is responding to treatment. You may want to share this information with your doctor or health care professional at each visit. You should contact your doctor or health care professional if your seizures get worse or if you have any new types of seizures. Do not stop taking this medicine or any of your seizure medicines unless instructed by your doctor or health care professional. Stopping your medicine suddenly can increase your seizures or their severity. Wear a medical identification bracelet or chain if you are taking this medicine for seizures, and carry a card that lists all your medications. You may get drowsy, dizzy, or have blurred vision. Do not drive, use machinery, or do anything that needs mental alertness until you know how this medicine affects you. To reduce dizzy or fainting spells, do not sit or stand up quickly, especially if you are an older patient. Alcohol can increase drowsiness and dizziness. Avoid alcoholic drinks. Your mouth may get dry. Chewing sugarless gum or sucking hard candy, and drinking plenty of water will help. The use of this medicine may increase the chance of suicidal thoughts or actions. Pay special attention to how you are responding while on this medicine. Any worsening of mood, or thoughts of suicide or dying should be reported to your health care professional right away. Women who become pregnant while using this medicine may enroll in the [place] American Antiepileptic Drug Pregnancy Registry by calling [phone]. This registry collects information about the safety of antiepileptic drug use during pregnancy. What side effects may I notice from receiving this medicine? Side effects that you should report to your doctor or health care professional as soon as possible: -allergic reactions  like skin rash, itching or hives, swelling of the face, lips, or tongue -worsening of mood, thoughts or actions of suicide or dying Side effects that usually do not require medical attention (report to your doctor or health care professional if they continue or are bothersome): -constipation -difficulty walking or controlling muscle movements -dizziness -nausea -slurred speech -tiredness -tremors -weight gain This list may not describe all possible side effects. Call your doctor for medical advice about side effects. You may report side effects to FDA at 1-800-FDA-1088. Where should I keep my medicine? Keep out of reach of children. This medicine may cause accidental overdose and death if it taken by other adults, children, or pets. Mix any unused medicine with a substance like cat litter or coffee grounds. Then throw the medicine away in a sealed container like a sealed bag or a coffee can with a lid. Do not use the medicine after the expiration date. Store at room temperature between 15 and 30 degrees C (59 and 86 degrees F). NOTE: This sheet is a summary. It may not cover all possible information. If you have questions about this medicine, talk to your doctor,  pharmacist, or health care provider.    2016, Elsevier/Gold Standard. (2013-12-12 15:26:50)

## 2016-02-25 NOTE — ED Provider Notes (Signed)
CSN: [id number]     Arrival date & time 02/25/16  0304 History   First MD Initiated Contact with Patient 02/25/16 0359     Chief Complaint  Patient presents with  . Leg Pain     (Consider location/radiation/quality/duration/timing/severity/associated sxs/prior Treatment) HPI   Patient to the ER with PMH of reflux and diabetes with complaints of bilateral leg pain, she describes it as mid thigh and constant pain that is achy in quality. She has recently been traveling for long distances in a car. She called her PCP's nurse hot line and they recommended she she go to the ER to be evaluated. She denies having any SOB, pain with walking, trauma, CP, low back pain.  She has been being evaluated by her PCP and neurologist since November for paresthesias to her bilateral arms and legs intermittently. They have been unable to find the source. She reports that they have done "so many tests" but are unsure of the etiology. She reports this pain feels different. No weakness or numbness. She has been prescribed Gabapentin but has never tried taking it.    Past Medical History  Diagnosis Date  . Reflux   . Premature ovarian failure age 24  . Osteoporosis 07/2013    T score -2.9 AP spine  . Pap smear of cervix with ASCUS, cannot exclude HGSIL 08/2015    colposcopy biopsy 12:00 with LGSIL negative ECC  . Fatigue   . SOB (shortness of breath)   . Chest pain   . Abnormal EKG   . Diabetes mellitus without complication (HCC)   . Neuropathy [facility])    Past Surgical History  Procedure Laterality Date  . No surgical history     Family History  Problem Relation Age of Onset  . Colon cancer Father   . Neurofibromatosis Neg Hx    Social History  Substance Use Topics  . Smoking status: Never Smoker   . Smokeless tobacco: None  . Alcohol Use: No   OB History    Gravida Para Term Preterm AB TAB SAB Ectopic Multiple Living   Review of Systems  Review of Systems All other  systems negative except as documented in the HPI. All pertinent positives and negatives as reviewed in the HPI.   Allergies  Review of patient's allergies indicates no known allergies.  Home Medications   Prior to Admission medications   Medication Sig Start Date End Date Taking? Authorizing Provider  cholecalciferol (VITAMIN D) 1000 units tablet Take 3,000 Units by mouth daily.   Yes Historical Provider, MD  vitamin B-12 (CYANOCOBALAMIN) 1000 MCG tablet Take 1,000 mcg by mouth daily.   Yes Historical Provider, MD   BP 136/76 mmHg  Pulse 65  Temp(Src) 98.3 F (36.8 C) (Oral)  Resp 14  Ht  (1.651 m)  Wt 82.555 kg  BMI 30.29 kg/m2  SpO2 100% Physical Exam  Constitutional: She appears well-developed and well-nourished. No distress.  HENT:  Head: Normocephalic and atraumatic.  Eyes: Pupils are equal, round, and reactive to light.  Neck: Normal range of motion. Neck supple.  Cardiovascular: Normal rate and regular rhythm.   Pulmonary/Chest: Effort normal.  Abdominal: Soft.  Musculoskeletal:  A she has intact sensation, pulses to her bilateral lower extremities. Skin is warm and moist without any rashes. No signs of infection. She does have increased pain to palpation of her anterior thigh. Strength is symmetrical and is a be  expected for her age and gender. She does not exhibit any clonus.  Neurological: She is alert.  Skin: Skin is warm and dry.  Nursing note and vitals reviewed.   ED Course  Procedures (including critical care time) Labs Review Labs Reviewed  D-DIMER, QUANTITATIVE (NOT [facility])    Imaging Review No results found. I have personally reviewed and evaluated these images and lab results as part of my medical decision-making.   EKG Interpretation None      MDM   Final diagnoses:  Small fiber neuropathy (HCC)  Pain of lower extremity, unspecified laterality    The patient has a negative d-dimer making blood clot very unlikely, especially given  that it's bilateral pain. She has been having chronic issues similar to this therefore I recommend that she follow-up with her primary care doctor as well as the neurologist. I've also encouraged her to start taking her gabapentin. No CP or SOB. No focal or general weakness. No headaches or neuro focal deficits on exam.  Medications  oxyCODONE-acetaminophen (PERCOCET/ROXICET) 5-325 MG per tablet 1 tablet (1 tablet Oral Given 02/25/16 0510)    I discussed results, diagnoses and plan with Otis PeakKaren Moore. They voice there understanding and questions were answered. We discussed follow-up recommendations and return precautions.   Marlon Peliffany Tianna Baus, PA-C 02/25/16 [phone]  Derwood KaplanAnkit Nanavati, MD 02/25/16 [phone]

## 2016-02-25 NOTE — ED Notes (Signed)
Patient c/o bi-lateral leg pain in mid thigh. Patient states that aching is constant.   Patient advised that has been traveling in a car a lot to see her kids in [place].  Patient stated that she called the nurse first line and they advised her to come her to be evaluated.  Patient denies difficulty walking.  Patient states pain 6/10 pain.  Breathing even and unlabored.  NAD at this time.

## 2016-03-13 ENCOUNTER — Telehealth: Payer: Self-pay | Admitting: Neurology

## 2016-03-13 NOTE — Telephone Encounter (Addendum)
WORK IN DOCTOR:  Patient is calling and states that her symptoms of pain in legs, fee, and hands have not improved and that it was suggested by other doctors that she have an MRI. Please call.

## 2016-03-14 NOTE — Telephone Encounter (Signed)
I'm sorry I did not see this phone call till this morning. This is your patient.    If you need me to handle this just send it back to me.

## 2016-03-14 NOTE — Telephone Encounter (Signed)
Sabrina Gallagher, can you give her a call and see what is going on? Last time I saw her she said her symptoms were improved, thanks

## 2016-03-14 NOTE — Telephone Encounter (Addendum)
LVM on home/work number returning pt call. Gave [facility] phone number for her to call back.   Per Dr Lucia GaskinsAhern- offer appt with Tylene FantasiaMegan M, NP this week if available. If not, offer appt with Wynelle Clevelandarolyn M, NP this week.

## 2016-03-15 NOTE — Telephone Encounter (Addendum)
Dr Lucia GaskinsAhern- [place] ChildesFYI. Checked on status of appt scheduling. Pt scheduled appt with Tylene FantasiaMegan M, NP for 03/23/16 at 300pm.

## 2016-03-20 ENCOUNTER — Other Ambulatory Visit: Payer: Self-pay | Admitting: Neurology

## 2016-03-20 DIAGNOSIS — R202 Paresthesia of skin: Secondary | ICD-10-CM

## 2016-03-23 ENCOUNTER — Ambulatory Visit: Payer: 59 | Admitting: Adult Health

## 2016-03-29 ENCOUNTER — Encounter: Payer: Self-pay | Admitting: Adult Health

## 2016-03-29 ENCOUNTER — Ambulatory Visit (INDEPENDENT_AMBULATORY_CARE_PROVIDER_SITE_OTHER): Payer: 59 | Admitting: Adult Health

## 2016-03-29 VITALS — BP 128/86 | HR 70 | Resp 16 | Ht 65.0 in | Wt 180.0 lb

## 2016-03-29 DIAGNOSIS — G629 Polyneuropathy, unspecified: Secondary | ICD-10-CM

## 2016-03-29 DIAGNOSIS — S76311A Strain of muscle, fascia and tendon of the posterior muscle group at thigh level, right thigh, initial encounter: Secondary | ICD-10-CM | POA: Diagnosis not present

## 2016-03-29 NOTE — Patient Instructions (Signed)
Physical therapy for muscle strain If your symptoms worsen or you develop new symptoms please let us know.

## 2016-03-29 NOTE — Progress Notes (Addendum)
PATIENT: Sabrina Gallagher DOB: [birth year]  REASON FOR VISIT: follow up HISTORY FROM: patient  HISTORY OF PRESENT ILLNESS: Today 03/29/2016: Ms. Christell ConstantMoore is a 55 year old female with a history of possible small fiber neuropathy. She returns today complaining of pain in the right hamstring. She reports that the burning and tingling sensation in the feet have resolved. She states that she began working out again in February. In March she began to have pain in the right hamstring and noticed a "knot." She states that her legs feel "strained"-- primarily the right leg- this sensation extends to the right buttocks and lower back. She states that if she walks for a long period of time she gets really tired. She states that at night she notices more pain in the right hamstring. She did go to urgent care who did lab work and found that her muscle enzymes were elevated. She was advised to stop exercising. She followed up with her primary care who repeated lab work and her muscle enzymes have since decreased per the patient- she has a f/u for labwork in July. Her primary care referred her to rheumatology who has completed a series of testing-- the patient is waiting for results. She is scheduled for nerve conduction study with EMG with Dr. Lucia GaskinsAhern on June 26. She denies any changes with the bowels or bladder. Denies any sharp shooting pains down the leg. She describes her discomfort in the hamstring as a dull ache however sometimes the pain can become more severe. She has tried stretching but that that has offered limited benefit. She returns today for an evaluation.  The patient's notes from her visit to urgent care, primary care & rheumatology are not available to me. I also do not have her latest lab results.  Initial visit 01/31/16 [facility]): Sabrina Gallagher is a 55 y.o. female here as a referral from Dr. Delena ServeWharton for tingling in the arms and the legs. Past medical history of B12 deficiency, prediabetes, premature ovarian  failure and osteoporosis, hyperlipidemia, obesity. Symptoms Started in November with tingling in the legs and arms. She went to the ED and to the primary. It was painful, accompanied by SOB and she felt like she was going to pass out. Thought it was cardiac in nature. She was evaluated by cardiology and everything was fine. Symptoms were precipitated by stress in her life, she got a promotion in November, then everything spiraled out of control, she was trying to do too much and was eating horribly and not sleeping well. Tingling was in the top of the feet and in the calfs. Early on it was in the fingers then that resolved and just left the feet. No burning or numbness just tingling. Heat exacerbated her symptoms. Worse with over exertion and walking and exercising also positional in nature. She denied any other associated, No weakness, no headache, no dysarthria, no vision changes, no dysphagia. B12 was found to be low normal and now taking a B complex vitamin. That is helping. She is not as fatigued now. She feels better.She is exercising more. . Never had any neurologic issues in her life previous to this. She has been reasonable healthy. Recently she has been more conscious of what she has been eating. She has difficulty with things on her feet still. Her hgba1c was recently 6.1. She will be following up in gastroenterology as well as chest pain was thought to be GI related per cardiology.   Reviewed notes, labs and imaging from outside physicians, which  showed: Patient was seen in the emergency room in November 2016 reporting tingling in the legs arms and hands with onset November. She reported the symptoms gradually worsening with onset day before. She also reported associated generalized weakness, denies shortness of breath, chest pain, fevers or chills. She also described dizziness, lightheadedness, chest pain, numbness and shortness of breath for the past several weeks. Neurologic exam in the  emergency room was nonfocal. EKG showed normal sinus rhythm with nonspecific T-wave abnormality. QTc was 396 (personally reviewed tracing and findings are nonspecific). She was discharge with follow-up with cardiology. She was evaluated by cardiology in December 2016 who thought that her symptoms were GI origin or related to increased stress at work EKG nonspecific. They recommended an echocardiogram and stress reduction.  Notes from [place] show that hemoglobin A1c was 6.1, B12 306, folate 14, CBC unremarkable, TSH 1.57. Vitamin D 25 OH 37.  Chest x-ray 09/01/2015,: No radiographic evidence of acute cardiopulmonary disease.  REVIEW OF SYSTEMS: Out of a complete 14 system review of symptoms, the patient complains only of the following symptoms, and all other reviewed systems are negative.  Fatigue, snoring, aching muscles, muscle cramps  ALLERGIES: No Known Allergies  HOME MEDICATIONS: Outpatient Prescriptions Prior to Visit  Medication Sig Dispense Refill  . cholecalciferol (VITAMIN D) 1000 units tablet Take 3,000 Units by mouth daily.    . vitamin B-12 (CYANOCOBALAMIN) 1000 MCG tablet Take 1,000 mcg by mouth daily.     No facility-administered medications prior to visit.    PAST MEDICAL HISTORY: Past Medical History  Diagnosis Date  . Reflux   . Premature ovarian failure age 54  . Osteoporosis 07/2013    T score -2.9 AP spine  . Pap smear of cervix with ASCUS, cannot exclude HGSIL 08/2015    colposcopy biopsy 12:00 with LGSIL negative ECC  . Fatigue   . SOB (shortness of breath)   . Chest pain   . Abnormal EKG   . Diabetes mellitus without complication (HCC)   . Neuropathy (HCC)     PAST SURGICAL HISTORY: Past Surgical History  Procedure Laterality Date  . No surgical history      FAMILY HISTORY: Family History  Problem Relation Age of Onset  . Colon cancer Father   . Neurofibromatosis Neg Hx     SOCIAL HISTORY: Social History   Social History  . Marital  Status: Single    Spouse Name: N/A  . Number of Children: 2  . Years of Education: 16   Occupational History  . [employer]- [employer]    Social History Main Topics  . Smoking status: Never Smoker   . Smokeless tobacco: Not on file  . Alcohol Use: No  . Drug Use: No  . Sexual Activity: Yes    Birth Control/ Protection: Post-menopausal     Comment: 1st intercourse 55 yo-Fewer than 5 partners   Other Topics Concern  . Not on file   Social History Narrative   Lives alone   Caffeine use: rare       PHYSICAL EXAM  Filed Vitals:   03/29/16 1014  BP: 128/86  Pulse: 70  Resp: 16  Height:  (1.651 m)  Weight: 180 lb (81.647 kg)   Body mass index is 29.95 kg/(m^2).  Generalized: Well developed, in no acute distress   Neurological examination  Mentation: Alert oriented to time, place, history taking. Follows all commands speech and language fluent Cranial nerve II-XII: Pupils were equal round reactive to light.  Extraocular movements were full, visual field were full on confrontational test. Facial sensation and strength were normal. Uvula tongue midline. Head turning and shoulder shrug  were normal and symmetric. Motor: The motor testing reveals 5 over 5 strength of all 4 extremities. Good symmetric motor tone is noted throughout. Patient's right hamstring muscle is tight compared to the left hamstring muscle. Sensory: Sensory testing is intact to soft touch on all 4 extremities. No evidence of extinction is noted.  Coordination: Cerebellar testing reveals good finger-nose-finger and heel-to-shin bilaterally.  Gait and station: Gait is normal. Tandem gait is normal. Romberg is negative. No drift is seen.  Reflexes: Deep tendon reflexes are symmetric and normal bilaterally.   DIAGNOSTIC DATA (LABS, IMAGING, TESTING) - I reviewed patient records, labs, notes, testing and imaging myself where available.  Lab Results  Component Value Date   WBC 5.7 09/27/2015    HGB 12.5 09/27/2015   HCT 39.2 09/27/2015   MCV 86.0 09/27/2015   PLT 272 09/27/2015      Component Value Date/Time   NA 136 09/27/2015 2129   K 3.8 09/27/2015 2129   CL 101 09/27/2015 2129   CO2 26 09/27/2015 2129   GLUCOSE 106* 09/27/2015 2129   BUN 10 09/27/2015 2129   CREATININE 0.91 09/27/2015 2129   CREATININE 0.95 07/01/2014 1554   CALCIUM 9.4 09/27/2015 2129   PROT 7.2 09/27/2015 2129   ALBUMIN 3.9 09/27/2015 2129   AST 21 09/27/2015 2129   ALT 19 09/27/2015 2129   ALKPHOS 75 09/27/2015 2129   BILITOT 0.2* 09/27/2015 2129   GFRNONAA >60 09/27/2015 2129   GFRAA >60 09/27/2015 2129   Lab Results  Component Value Date   CHOL 197 09/16/2015   HDL 47 09/16/2015   LDLCALC 127 09/16/2015   TRIG 115 09/16/2015   CHOLHDL 4.2 09/16/2015       ASSESSMENT AND PLAN 55 y.o. year old female  has a past medical history of Reflux; Premature ovarian failure (age 23); Osteoporosis (07/2013); Pap smear of cervix with ASCUS, cannot exclude HGSIL (08/2015); Fatigue; SOB (shortness of breath); Chest pain; Abnormal EKG; Diabetes mellitus without complication (HCC); and Neuropathy (HCC). here with:  1. Strain of the right hamstring 2. Small fiber neuropathy  The patient's symptoms sound as if she has a strained hamstring muscle. I consulted with Dr. Lucia Gaskins. We will refer the patient to physical therapy. She will keep her appointment in June for nerve conduction studies with EMG. Patient advised that if her symptoms worsen or she develops new symptoms she should let us know. She will follow-up in 3-4 months with Dr. Lucia Gaskins.  Butch Penny, MSN, NP-C 03/29/2016, 10:11 AM [facility] [address], [address] [place], [place] [address] [phone]    Personally participated in and made any corrections needed to history, physical, neuro exam,assessment and plan as stated above, evaluated lab date, reviewed imaging studies and agree with radiology interpretations.     Naomie Dean, MD [facility]

## 2016-04-24 ENCOUNTER — Ambulatory Visit (INDEPENDENT_AMBULATORY_CARE_PROVIDER_SITE_OTHER): Payer: Self-pay | Admitting: Neurology

## 2016-04-24 ENCOUNTER — Ambulatory Visit (INDEPENDENT_AMBULATORY_CARE_PROVIDER_SITE_OTHER): Payer: 59 | Admitting: Neurology

## 2016-04-24 DIAGNOSIS — G629 Polyneuropathy, unspecified: Secondary | ICD-10-CM | POA: Diagnosis not present

## 2016-04-24 DIAGNOSIS — M791 Myalgia, unspecified site: Secondary | ICD-10-CM | POA: Insufficient documentation

## 2016-04-24 DIAGNOSIS — R202 Paresthesia of skin: Secondary | ICD-10-CM | POA: Insufficient documentation

## 2016-04-24 DIAGNOSIS — Z0289 Encounter for other administrative examinations: Secondary | ICD-10-CM

## 2016-04-24 NOTE — Progress Notes (Signed)
See procedure note.

## 2016-04-24 NOTE — Progress Notes (Signed)
  [facility]    Provider:  Dr Lucia GaskinsAhern Referring Provider: Jarrett SohoWharton, Courtney, PA-C Primary Care Physician:  Jarrett SohoWharton, Courtney, PA-C  history:  Sabrina Gallagher is a 55 y.o. female here as a referral from Dr. Delena ServeWharton for chronic neuropathy but more recent leg and muscle pain after starting a work-out regimen and subsequent elevated CK. She has improved since stopping her workouts. She has no issues in the hands as much as the legs and the right leg is the worst. She still has muscle pain and knots in her muscles.   Summary  Nerve conduction studies were performed on the left upper and right lower extremities:  Left APB Median motor nerve showed normal conductions with normal F Wave latency Left ADM Ulnar motor nerves showed normal conductions with normal F Wave latency The right Peroneal motor nerve showed normal conductions with normal F Wave latency The right Tibial motor nerve showed normal conductions with normal F Wave latency The left second-digit Median sensory nerve conduction was within normal limits The left fifth-digit Ulnar sensory nerve conduction was within normal limits The right Sural sensory nerve conduction was within normal limits Right H Reflexes showed normal latencies  EMG Needle study was performed on selected right lower extremity muscles. Deferred EMG needle study on the left arm as patient's symptoms resolved there:   The Iliopsoas, Biceps Femoris(Long head), Gluteus Maximus, Vastus Medialis, Anterior Tibialis, Medial Gastrocnemius, Extensor Hallucis Longus muscles and Lumbosacral paraspinals were within normal limits.  Conclusion: This is a normal study. No electrophysiologic evidence for Ulnar or Median neuropathy, radiculopathy, or neuromuscular disorder. However a small fiber neuropathy is likely responsible for patient's chronic paresthesias and can evade detection by this study.    Sabrina DeanAntonia Ahern, MD  [facility] [address] [place], [place] [address]  Phone [phone] Fax [phone]

## 2016-04-25 ENCOUNTER — Encounter: Payer: Self-pay | Admitting: Physical Therapy

## 2016-04-25 ENCOUNTER — Ambulatory Visit: Payer: 59 | Attending: Adult Health | Admitting: Physical Therapy

## 2016-04-25 DIAGNOSIS — R29898 Other symptoms and signs involving the musculoskeletal system: Secondary | ICD-10-CM | POA: Diagnosis present

## 2016-04-25 DIAGNOSIS — M6281 Muscle weakness (generalized): Secondary | ICD-10-CM | POA: Diagnosis present

## 2016-04-25 DIAGNOSIS — R2689 Other abnormalities of gait and mobility: Secondary | ICD-10-CM | POA: Diagnosis present

## 2016-04-25 DIAGNOSIS — R2241 Localized swelling, mass and lump, right lower limb: Secondary | ICD-10-CM

## 2016-04-25 DIAGNOSIS — M79604 Pain in right leg: Secondary | ICD-10-CM | POA: Diagnosis not present

## 2016-04-29 NOTE — Therapy (Signed)
[facility] [address] [place], [place], [address] Phone: [phone]   Fax:  [phone]  Physical Therapy Evaluation  Patient Details  Name: Sabrina PeakKaren Gallagher MRN: [id number] Date of Birth: 1961-06-16 Referring Provider: Butch PennyMegan Millikan, NP  Encounter Date: 04/25/2016      PT End of Session - 04/29/16 0950    Visit Number 1   Number of Visits 9   Date for PT Re-Evaluation 05/25/16   Authorization Type UHC   PT Start Time 1450   PT Stop Time 1530   PT Time Calculation (min) 40 min      Past Medical History  Diagnosis Date  . Reflux   . Premature ovarian failure age 55  . Osteoporosis 07/2013    T score -2.9 AP spine  . Pap smear of cervix with ASCUS, cannot exclude HGSIL 08/2015    colposcopy biopsy 12:00 with LGSIL negative ECC  . Fatigue   . SOB (shortness of breath)   . Chest pain   . Abnormal EKG   . Diabetes mellitus without complication (HCC)   . Neuropathy [facility](HCC)     Past Surgical History  Procedure Laterality Date  . No surgical history      There were no vitals filed for this visit.       Subjective Assessment - 04/29/16 0916    Subjective Pt reports symptoms of numbness and tingling in bil. UE's and LE's; started working out with trainer and sustained R hamstring injury in April 2017; pt states R leg has improved alot within past month- states she wore wedge=heeled shoes to work for first time today (has been wearing flats)  pt has "knot" on posterior thigh - torn hamstring?   Diagnostic tests NCV and EMG done 04-24-16 -- negative but MD still feels small fiber neuropathy: CK test 1079 decr. to 395 with discontinuation of workout   Patient Stated Goals resolve R hamstring pain; be able to resume working out   Currently in Pain? Yes   Pain Score 6    Pain Location Leg   Pain Orientation Right;Posterior   Pain Descriptors / Indicators Discomfort   Pain Type Chronic pain   Pain Onset More than  a month ago   Pain Frequency Constant   Aggravating Factors  walking and activity:  sitting on it at work   Pain Relieving Factors standing            [facility] PT Assessment - 04/29/16 0001    Assessment   Medical Diagnosis R hamstring strain   Referring Provider Butch PennyMegan Millikan, NP   Onset Date/Surgical Date --  end of April 2017   Prior Therapy none   Precautions   Precautions Other (comment)   Precaution Comments No strenuous exercise   Restrictions   Weight Bearing Restrictions No   Balance Screen   Has the patient fallen in the past 6 months No   Has the patient had a decrease in activity level because of a fear of falling?  No   Is the patient reluctant to leave their home because of a fear of falling?  No   Home Environment   Living Environment Private residence   Type of Home Apartment   Home Access Stairs to enter   Prior Function   Level of Independence Independent   Strength   Right/Left Hip Right   Right Hip Flexion 5/5   Right Hip Extension 3+/5   Right Hip ABduction 4+/5   Right/Left Knee  Right   Right Knee Flexion 4/5   Right Knee Extension 5/5  pain with resistance   Right/Left Ankle Right   Right Ankle Dorsiflexion 5/5   Right Ankle Plantar Flexion 5/5   Ambulation/Gait   Ambulation/Gait Yes   Ambulation/Gait Assistance 7: Independent   Assistive device None   Gait Pattern Within Functional Limits   Ambulation Surface Level;Indoor                           PT Education - 04/29/16 [phone]    Education provided Yes   Education Details R hamstring and heel cord stretches   Person(s) Educated Patient   Methods Explanation;Demonstration;Handout   Comprehension Verbalized understanding;Returned demonstration             PT Long Term Goals - 04/29/16 1004    PT LONG TERM GOAL #1   Title Pt will report decr. RLE/hamstring pain to </= 3/10 to enable incr. tolerance with sitting for work day.  (05-25-16)   Baseline 6/10    Time  4   Period Weeks   Status New   PT LONG TERM GOAL #2   Title Pt will demonstrate incr. activity tolerance by participating in at least 30" activity of strengthening/cardio exercise in PT session with RLE pain </= 4/10.  (05-25-16)   Time 4   Period Weeks   Status New   PT LONG TERM GOAL #3   Title Independen in HEP with pt able to resume workouts at gym.  (05-25-16)   Time 4   Period Weeks   Status New   PT LONG TERM GOAL #4   Title Improve FOTO score to >/= 72/100 to demo improvement in activity tolerance.  (05-25-16)   Baseline 66/100 on 04-25-16:  normal adjusted statistical 55/100   Time 4   Period Weeks   Status New               Plan - 04/29/16 0951    Clinical Impression Statement Pt is a 55 year old female with c/o R hamstring pain due to strain/injury sustained in late April 2017 while working out with trainer.  Pt has enlarged area in posterior R hamstring (due to hamstring avulsion?) ; pt states pain has significantly improved with rest and inactivity within past couple months.  Pt presents  with bil. hip extensor weakness and some pain with resisted R knee flexion.  PMH includes small fiber neuropathy, DM, osteoporosis and abnormal EKG.                                                                                                                           Rehab Potential Good   PT Frequency 2x / week   PT Duration 4 weeks   PT Treatment/Interventions ADLs/Self Care Home Management;Electrical Stimulation;Moist Heat;Ultrasound;Gait training;Stair training;Therapeutic activities;Therapeutic exercise;Balance training;Neuromuscular re-education;Patient/family education;Manual techniques   PT Next Visit Plan Ultrasound to R hamstring area; check stretches given  for HEP:  begin R hip/hamstring strengthening   PT Home Exercise Plan RLE stretching and strengthening   Consulted and Agree with Plan of Care Patient      Patient will benefit from skilled therapeutic intervention  in order to improve the following deficits and impairments:  Difficulty walking, Pain, Decreased strength  Visit Diagnosis: Pain In Right Leg - Plan: PT plan of care cert/re-cert  Localized swelling, mass and lump, right lower limb - Plan: PT plan of care cert/re-cert  Muscle weakness (generalized) - Plan: PT plan of care cert/re-cert  Other symptoms and signs involving the musculoskeletal system - Plan: PT plan of care cert/re-cert  Other abnormalities of gait and mobility - Plan: PT plan of care cert/re-cert     Problem List Patient Active Problem List   Diagnosis Date Noted  . Paresthesias 04/24/2016  . Muscle pain 04/24/2016  . Small fiber neuropathy (HCC) 01/31/2016  . B12 deficiency 01/31/2016  . Pre-diabetes 01/31/2016  . Reflux   . Premature ovarian failure   . OVARIAN FAILURE, PREMATURE 06/23/2010  . HYPERLIPIDEMIA-MIXED 06/23/2010  . WEIGHT GAIN 06/23/2010  . HEARTBURN 06/23/2010    Kary Kos, PT 04/29/2016, 10:17 AM  [facility] [address] [place], [place], [address] Phone: [phone]   Fax:  [phone]  Name: Sabrina Gallagher MRN: [id number] Date of Birth: 01-Nov-1960

## 2016-04-29 NOTE — Patient Instructions (Addendum)
Calf Stretch    Hands on wall, shoulder height, slightly wider than shoulders, fingers up. Left foot ahead of right. Body straight (like a board); lean into wall by bending front knee. Keep right heel on floor and foot straight ahead. Hold _30-45__ seconds. Push away with arms. Repeat _2__ times. Do on other leg.  Copyright  VHI. All rights reserved.      Hamstring Stretch    Sitting with operated leg straight on bed, and foot of other leg on floor, lean forward toward toes of straight leg. Hold _30-45___ seconds. Repeat _1-2___ times. Do __2-3 times/day.  Sit in chair with back support.  Gastroc / Heel Cord Stretch - On Step    Stand with heels over edge of stair. Holding rail, lower heels until stretch is felt in calf of legs.  Hold 30-45 secs Repeat __2_ times. Do _2__ times per day.    http://gt2.exer.us/303   Copyright  VHI. All rights reserved.

## 2016-05-03 ENCOUNTER — Ambulatory Visit: Payer: 59 | Admitting: Physical Therapy

## 2016-05-05 ENCOUNTER — Ambulatory Visit: Payer: 59 | Admitting: Physical Therapy

## 2016-05-10 ENCOUNTER — Ambulatory Visit: Payer: 59 | Attending: Adult Health | Admitting: Physical Therapy

## 2016-05-10 DIAGNOSIS — M6281 Muscle weakness (generalized): Secondary | ICD-10-CM | POA: Diagnosis present

## 2016-05-10 DIAGNOSIS — R2241 Localized swelling, mass and lump, right lower limb: Secondary | ICD-10-CM | POA: Insufficient documentation

## 2016-05-10 DIAGNOSIS — R29898 Other symptoms and signs involving the musculoskeletal system: Secondary | ICD-10-CM

## 2016-05-10 DIAGNOSIS — M79604 Pain in right leg: Secondary | ICD-10-CM | POA: Diagnosis not present

## 2016-05-10 DIAGNOSIS — R2689 Other abnormalities of gait and mobility: Secondary | ICD-10-CM | POA: Diagnosis present

## 2016-05-11 NOTE — Therapy (Signed)
[facility] [address] [place], [place], [address] Phone: [phone]   Fax:  [phone]  Physical Therapy Treatment  Patient Details  Name: Harlym Gehling MRN: [id number] Date of Birth: 21-Jun-1961 Referring Provider: Butch Penny, NP  Encounter Date: 05/10/2016      PT End of Session - 05/10/16 1312    Visit Number 2   Number of Visits 9   Date for PT Re-Evaluation 05/25/16   Authorization Type UHC   PT Start Time 1102   PT Stop Time 1145   PT Time Calculation (min) 43 min   Activity Tolerance Patient tolerated treatment well   Behavior During Therapy [facility] for tasks assessed/performed      Past Medical History  Diagnosis Date  . Reflux   . Premature ovarian failure age 59  . Osteoporosis 07/2013    T score -2.9 AP spine  . Pap smear of cervix with ASCUS, cannot exclude HGSIL 08/2015    colposcopy biopsy 12:00 with LGSIL negative ECC  . Fatigue   . SOB (shortness of breath)   . Chest pain   . Abnormal EKG   . Diabetes mellitus without complication (HCC)   . Neuropathy [facility])     Past Surgical History  Procedure Laterality Date  . No surgical history      There were no vitals filed for this visit.      Subjective Assessment - 05/10/16 1109    Subjective Feels like her leg is getting better. Has not returned to gym, waiting until she see' s the MD to see if her blood levels are still good. Reports doing the streches in the morning only, discussed adding them to her evening routine as well.   Diagnostic tests NCV and EMG done 04-24-16 -- negative but MD still feels small fiber neuropathy: CK test 1079 decr. to 395 with discontinuation of workout   Patient Stated Goals resolve R hamstring pain; be able to resume working out   Currently in Pain? Yes   Pain Score 4    Pain Location Leg   Pain Orientation Right;Posterior   Pain Descriptors / Indicators Sore;Discomfort   Pain Type Chronic pain   Pain  Onset More than a month ago   Pain Frequency Constant   Aggravating Factors  walking and increased activty; prolonged sitting   Pain Relieving Factors standing, stretching             [facility] Adult PT Treatment/Exercise - 05/11/16 1313    Knee/Hip Exercises: Stretches   Active Hamstring Stretch Right;3 reps;30 seconds;Limitations   Active Hamstring Stretch Limitations right leg in extension on mat with left foot on floor, pt reaching toward ankle until stretch is felt and holding this position.   Passive Hamstring Stretch Right;5 reps;30 seconds;Limitations   Passive Hamstring Stretch Limitations pt with limited range before pain started, firm end feels   ITB Stretch Right;3 reps;30 seconds;Limitations   ITB Stretch Limitations passive stretching of right IT band to decrease tightness   Gastroc Stretch Both;3 reps;30 seconds;Limitations   Gastroc Stretch Limitations hanging heels off of stair edge with bil UE support   Modalities   Modalities Ultrasound   Ultrasound   Ultrasound Location right hamstring over knot in muscle belly   Ultrasound Parameters 100%, 1 mhz, 1.5 w/cm2   Ultrasound Goals Edema;Pain   Manual Therapy   Manual therapy comments strain-counter-strain to right hamstring and IT band for decreased trigger points, decreased tightness and to increase tissue extensibility  Soft tissue mobilization to right hamstring and IT band for decreased edema and decreased tightness.             PT Long Term Goals - 04/29/16 1004    PT LONG TERM GOAL #1   Title Pt will report decr. RLE/hamstring pain to </= 3/10 to enable incr. tolerance with sitting for work day.  (05-25-16)   Baseline 6/10    Time 4   Period Weeks   Status New   PT LONG TERM GOAL #2   Title Pt will demonstrate incr. activity tolerance by participating in at least 30" activity of strengthening/cardio exercise in PT session with RLE pain </= 4/10.  (05-25-16)   Time 4   Period Weeks   Status New   PT  LONG TERM GOAL #3   Title Independen in HEP with pt able to resume workouts at gym.  (05-25-16)   Time 4   Period Weeks   Status New   PT LONG TERM GOAL #4   Title Improve FOTO score to >/= 72/100 to demo improvement in activity tolerance.  (05-25-16)   Baseline 66/100 on 04-25-16:  normal adjusted statistical 55/100   Time 4   Period Weeks   Status New           Plan - 05/10/16 1313    Clinical Impression Statement Today's session focused on addressing pt's hamstring tightness and the knot palpable in the muscle belly. No increase in pain was reported and pt did report feeling less tight after session. Pt is progressing toward goals.   Rehab Potential Good   PT Frequency 2x / week   PT Duration 4 weeks   PT Treatment/Interventions ADLs/Self Care Home Management;Electrical Stimulation;Moist Heat;Ultrasound;Gait training;Stair training;Therapeutic activities;Therapeutic exercise;Balance training;Neuromuscular re-education;Patient/family education;Manual techniques   PT Next Visit Plan Ultrasound to R hamstring area; continue with hamstring stretching/manual therapy as neeed;  begin R hip/hamstring strengthening   PT Home Exercise Plan RLE stretching and strengthening   Consulted and Agree with Plan of Care Patient      Patient will benefit from skilled therapeutic intervention in order to improve the following deficits and impairments:  Difficulty walking, Pain, Decreased strength  Visit Diagnosis: Pain In Right Leg  Localized swelling, mass and lump, right lower limb  Muscle weakness (generalized)  Other symptoms and signs involving the musculoskeletal system  Other abnormalities of gait and mobility     Problem List Patient Active Problem List   Diagnosis Date Noted  . Paresthesias 04/24/2016  . Muscle pain 04/24/2016  . Small fiber neuropathy (HCC) 01/31/2016  . B12 deficiency 01/31/2016  . Pre-diabetes 01/31/2016  . Reflux   . Premature ovarian failure   .  OVARIAN FAILURE, PREMATURE 06/23/2010  . HYPERLIPIDEMIA-MIXED 06/23/2010  . WEIGHT GAIN 06/23/2010  . HEARTBURN 06/23/2010    Sallyanne KusterKathy Bury, PTA, [facility] [address], [address] [place], [place] [address] [phone] 05/11/2016, 3:57 PM   Name: Otis PeakKaren Moore MRN: [id number] Date of Birth: 1960/11/20

## 2016-05-12 ENCOUNTER — Ambulatory Visit: Payer: 59 | Admitting: Physical Therapy

## 2016-05-12 ENCOUNTER — Encounter: Payer: Self-pay | Admitting: Physical Therapy

## 2016-05-12 DIAGNOSIS — M79604 Pain in right leg: Secondary | ICD-10-CM | POA: Diagnosis not present

## 2016-05-12 DIAGNOSIS — R2241 Localized swelling, mass and lump, right lower limb: Secondary | ICD-10-CM

## 2016-05-12 DIAGNOSIS — R29898 Other symptoms and signs involving the musculoskeletal system: Secondary | ICD-10-CM

## 2016-05-12 DIAGNOSIS — R2689 Other abnormalities of gait and mobility: Secondary | ICD-10-CM

## 2016-05-12 DIAGNOSIS — M6281 Muscle weakness (generalized): Secondary | ICD-10-CM

## 2016-05-12 NOTE — Therapy (Signed)
[facility] [address] [place], [place], [address] Phone: [phone]   Fax:  [phone]  Physical Therapy Treatment  Patient Details  Name: Sabrina PeakKaren Gallagher MRN: [id number] Date of Birth: 1960/12/17 Referring Provider: Butch PennyMegan Millikan, NP  Encounter Date: 05/12/2016      PT End of Session - 05/12/16 1559    Visit Number 3   Number of Visits 9   Date for PT Re-Evaluation 05/25/16   Authorization Type UHC   PT Start Time 1402   PT Stop Time 1445   PT Time Calculation (min) 43 min   Activity Tolerance Patient tolerated treatment well   Behavior During Therapy [facility] for tasks assessed/performed      Past Medical History  Diagnosis Date  . Reflux   . Premature ovarian failure age 55  . Osteoporosis 07/2013    T score -2.9 AP spine  . Pap smear of cervix with ASCUS, cannot exclude HGSIL 08/2015    colposcopy biopsy 12:00 with LGSIL negative ECC  . Fatigue   . SOB (shortness of breath)   . Chest pain   . Abnormal EKG   . Diabetes mellitus without complication (HCC)   . Neuropathy [facility](HCC)     Past Surgical History  Procedure Laterality Date  . No surgical history      There were no vitals filed for this visit.      Subjective Assessment - 05/12/16 1405    Subjective Pt reports she was able to sit easier with less pain after last session. Feels the hamstring is feeling better.   Diagnostic tests NCV and EMG done 04-24-16 -- negative but MD still feels small fiber neuropathy: CK test 1079 decr. to 395 with discontinuation of workout   Patient Stated Goals resolve R hamstring pain; be able to resume working out   Currently in Pain? Yes   Pain Score 3    Pain Location Leg   Pain Orientation Right;Posterior   Pain Descriptors / Indicators Aching;Sore;Discomfort   Pain Type Chronic pain   Pain Onset More than a month ago   Pain Frequency Constant   Aggravating Factors  prolonged sitting; walking and increased  activity   Pain Relieving Factors standing, stretching             [facility] Adult PT Treatment/Exercise - 05/12/16 1441    Knee/Hip Exercises: Stretches   Active Hamstring Stretch Right;3 reps;30 seconds;Limitations   Active Hamstring Stretch Limitations right leg in extension on mat with left foot on floor, pt reaching toward ankle until stretch is felt and holding this position.   Other Knee/Hip Stretches use of foam roller to work on hamstring tightness and IT band stretching as well.   Knee/Hip Exercises: Prone   Hamstring Curl 2 sets;10 reps;Limitations   Hamstring Curl Limitations cues on form and to not hike hip, cues to slow down for increased muscle control/activation   Hip Extension AROM;Strengthening;Right;2 sets;10 reps;Limitations  knee straight   Hip Extension Limitations cues on form and technique   Other Prone Exercises glut kick backs, 2 sets of 10 reps, cues on form and technique   Ultrasound   Ultrasound Location right hamstring   Ultrasound Parameters 100%, 1 Mhz, 1.5 w/cm2   Ultrasound Goals Edema;Pain   Manual Therapy   Manual therapy comments strain-counter-strain to right hamstring and IT band for decreased trigger points, decreased tightness and to increase tissue extensibility   Soft tissue mobilization to right hamstring and IT band for decreased edema  and decreased tightness.             PT Long Term Goals - 04/29/16 1004    PT LONG TERM GOAL #1   Title Pt will report decr. RLE/hamstring pain to </= 3/10 to enable incr. tolerance with sitting for work day.  (05-25-16)   Baseline 6/10    Time 4   Period Weeks   Status New   PT LONG TERM GOAL #2   Title Pt will demonstrate incr. activity tolerance by participating in at least 30" activity of strengthening/cardio exercise in PT session with RLE pain </= 4/10.  (05-25-16)   Time 4   Period Weeks   Status New   PT LONG TERM GOAL #3   Title Independen in HEP with pt able to resume workouts at gym.   (05-25-16)   Time 4   Period Weeks   Status New   PT LONG TERM GOAL #4   Title Improve FOTO score to >/= 72/100 to demo improvement in activity tolerance.  (05-25-16)   Baseline 66/100 on 04-25-16:  normal adjusted statistical 55/100   Time 4   Period Weeks   Status New            Plan - 05/12/16 1600    Clinical Impression Statement Today's session continued to work on decreasing pain and edema. Added in gentle exercises with no resistance with pt reporting "I felt every rep", however did not report overall increase in pain. Pt is making steady progress toward goals.    Rehab Potential Good   PT Frequency 2x / week   PT Duration 4 weeks   PT Treatment/Interventions ADLs/Self Care Home Management;Electrical Stimulation;Moist Heat;Ultrasound;Gait training;Stair training;Therapeutic activities;Therapeutic exercise;Balance training;Neuromuscular re-education;Patient/family education;Manual techniques   PT Next Visit Plan Ultrasound to R hamstring area; continue with hamstring stretching/manual therapy as neeed;  gentle  R hip/hamstring strengthening   PT Home Exercise Plan RLE stretching and strengthening   Consulted and Agree with Plan of Care Patient      Patient will benefit from skilled therapeutic intervention in order to improve the following deficits and impairments:  Difficulty walking, Pain, Decreased strength  Visit Diagnosis: Pain In Right Leg  Localized swelling, mass and lump, right lower limb  Muscle weakness (generalized)  Other symptoms and signs involving the musculoskeletal system  Other abnormalities of gait and mobility     Problem List Patient Active Problem List   Diagnosis Date Noted  . Paresthesias 04/24/2016  . Muscle pain 04/24/2016  . Small fiber neuropathy (HCC) 01/31/2016  . B12 deficiency 01/31/2016  . Pre-diabetes 01/31/2016  . Reflux   . Premature ovarian failure   . OVARIAN FAILURE, PREMATURE 06/23/2010  . HYPERLIPIDEMIA-MIXED  06/23/2010  . WEIGHT GAIN 06/23/2010  . HEARTBURN 06/23/2010    Sallyanne Kuster, PTA, [facility] [address], [address] [place], [place] [address] [phone] 05/12/2016, 4:02 PM   Name: Sabrina Gallagher MRN: [id number] Date of Birth: 11/13/1960

## 2016-05-16 ENCOUNTER — Ambulatory Visit: Payer: 59 | Admitting: Physical Therapy

## 2016-05-16 ENCOUNTER — Encounter: Payer: Self-pay | Admitting: Physical Therapy

## 2016-05-16 DIAGNOSIS — R2241 Localized swelling, mass and lump, right lower limb: Secondary | ICD-10-CM

## 2016-05-16 DIAGNOSIS — M79604 Pain in right leg: Secondary | ICD-10-CM | POA: Diagnosis not present

## 2016-05-16 DIAGNOSIS — R2689 Other abnormalities of gait and mobility: Secondary | ICD-10-CM

## 2016-05-16 DIAGNOSIS — M6281 Muscle weakness (generalized): Secondary | ICD-10-CM

## 2016-05-16 DIAGNOSIS — R29898 Other symptoms and signs involving the musculoskeletal system: Secondary | ICD-10-CM

## 2016-05-17 NOTE — Therapy (Signed)
[facility] [address] [place], [place], [address] Phone: [phone]   Fax:  [phone]  Physical Therapy Treatment  Patient Details  Name: Sabrina Gallagher MRN: [id number] Date of Birth: 02/16/1961 Referring Provider: Butch Penny, NP  Encounter Date: 05/16/2016      PT End of Session - 05/16/16 1607    Visit Number 4   Number of Visits 9   Date for PT Re-Evaluation 05/25/16   Authorization Type UHC   PT Start Time 1530   PT Stop Time 1605  pt needed to leave early for md appt   PT Time Calculation (min) 35 min   Activity Tolerance Patient tolerated treatment well   Behavior During Therapy [facility] for tasks assessed/performed      Past Medical History  Diagnosis Date  . Reflux   . Premature ovarian failure age 55  . Osteoporosis 07/2013    T score -2.9 AP spine  . Pap smear of cervix with ASCUS, cannot exclude HGSIL 08/2015    colposcopy biopsy 12:00 with LGSIL negative ECC  . Fatigue   . SOB (shortness of breath)   . Chest pain   . Abnormal EKG   . Diabetes mellitus without complication (HCC)   . Neuropathy [facility])     Past Surgical History  Procedure Laterality Date  . No surgical history      There were no vitals filed for this visit.      Subjective Assessment - 05/16/16 1536    Subjective Having increased sensations per pt report yesterday. "felt like something was rubbing across the back of my leg". only happened yesterday. with discussion was determined it could have been the skirt material she was wearing as she ususally wear's pant suits.    Diagnostic tests NCV and EMG done 04-24-16 -- negative but MD still feels small fiber neuropathy: CK test 1079 decr. to 395 with discontinuation of workout   Patient Stated Goals resolve R hamstring pain; be able to resume working out   Currently in Pain? Yes   Pain Score 3    Pain Location Leg   Pain Orientation Right;Posterior   Pain Descriptors /  Indicators Sore;Nagging   Pain Type Chronic pain   Pain Onset More than a month ago   Pain Frequency Constant   Aggravating Factors  pressure from prolonged sitting   Pain Relieving Factors standing, stretching           [facility] Adult PT Treatment/Exercise - 05/16/16 1611    Knee/Hip Exercises: Prone   Hamstring Curl 1 set;10 reps;Limitations   Hamstring Curl Limitations cues on form and to not hike hip, cues to slow down for increased muscle control/activation   Hip Extension AROM;Strengthening;Right;1 set;10 reps;Limitations   Hip Extension Limitations cues on form and technique   Other Prone Exercises glut kick backs, x 10 reps, cues on form and technique   Ultrasound   Ultrasound Location right hamstring over small area/knot in muscle belly   Ultrasound Parameters 100%, 1 mhx, 1.5 w/cm2   Ultrasound Goals Edema;Pain   Manual Therapy   Manual therapy comments strain-counter-strain to right hamstring and IT band for decreased trigger points, decreased tightness and to increase tissue extensibility. Foam roller used to work on manual stretching of hamstring and IT band.   Soft tissue mobilization to right hamstring and IT band for decreased edema and decreased tightness.             PT Long Term Goals - 04/29/16  1004    PT LONG TERM GOAL #1   Title Pt will report decr. RLE/hamstring pain to </= 3/10 to enable incr. tolerance with sitting for work day.  (05-25-16)   Baseline 6/10    Time 4   Period Weeks   Status New   PT LONG TERM GOAL #2   Title Pt will demonstrate incr. activity tolerance by participating in at least 30" activity of strengthening/cardio exercise in PT session with RLE pain </= 4/10.  (05-25-16)   Time 4   Period Weeks   Status New   PT LONG TERM GOAL #3   Title Independen in HEP with pt able to resume workouts at gym.  (05-25-16)   Time 4   Period Weeks   Status New   PT LONG TERM GOAL #4   Title Improve FOTO score to >/= 72/100 to demo improvement in  activity tolerance.  (05-25-16)   Baseline 66/100 on 04-25-16:  normal adjusted statistical 55/100   Time 4   Period Weeks   Status New           Plan - 05/16/16 2118    Clinical Impression Statement Today's session continued to address hamstring pain/tightness/weakness with no issues reported with session. Pt seeing MD today to have blood (CK) levels checked. Will plan to progess/advance exercises if WNL and MD okays advancing exercises. Pt is making steady progress toward goals.    Rehab Potential Good   PT Frequency 2x / week   PT Duration 4 weeks   PT Treatment/Interventions ADLs/Self Care Home Management;Electrical Stimulation;Moist Heat;Ultrasound;Gait training;Stair training;Therapeutic activities;Therapeutic exercise;Balance training;Neuromuscular re-education;Patient/family education;Manual techniques   PT Next Visit Plan Ultrasound to R hamstring area; continue with hamstring stretching/manual therapy as neeed;  gentle  R hip/hamstring strengthening   PT Home Exercise Plan RLE stretching and strengthening   Consulted and Agree with Plan of Care Patient      Patient will benefit from skilled therapeutic intervention in order to improve the following deficits and impairments:  Difficulty walking, Pain, Decreased strength  Visit Diagnosis: Pain In Right Leg  Localized swelling, mass and lump, right lower limb  Muscle weakness (generalized)  Other symptoms and signs involving the musculoskeletal system  Other abnormalities of gait and mobility     Problem List Patient Active Problem List   Diagnosis Date Noted  . Paresthesias 04/24/2016  . Muscle pain 04/24/2016  . Small fiber neuropathy (HCC) 01/31/2016  . B12 deficiency 01/31/2016  . Pre-diabetes 01/31/2016  . Reflux   . Premature ovarian failure   . OVARIAN FAILURE, PREMATURE 06/23/2010  . HYPERLIPIDEMIA-MIXED 06/23/2010  . WEIGHT GAIN 06/23/2010  . HEARTBURN 06/23/2010    Sallyanne KusterKathy Bury, PTA, [facility] Outpatient  [facility] [address], [address] [place], [place] [address] [phone] 05/17/2016, 9:21 PM   Name: Sabrina Gallagher MRN: [id number] Date of Birth: [birth year]

## 2016-05-18 ENCOUNTER — Encounter: Payer: Self-pay | Admitting: Physical Therapy

## 2016-05-18 ENCOUNTER — Ambulatory Visit: Payer: 59 | Admitting: Physical Therapy

## 2016-05-18 DIAGNOSIS — M79604 Pain in right leg: Secondary | ICD-10-CM

## 2016-05-18 DIAGNOSIS — R2241 Localized swelling, mass and lump, right lower limb: Secondary | ICD-10-CM

## 2016-05-18 DIAGNOSIS — M6281 Muscle weakness (generalized): Secondary | ICD-10-CM

## 2016-05-18 DIAGNOSIS — R2689 Other abnormalities of gait and mobility: Secondary | ICD-10-CM

## 2016-05-18 DIAGNOSIS — R29898 Other symptoms and signs involving the musculoskeletal system: Secondary | ICD-10-CM

## 2016-05-19 NOTE — Therapy (Signed)
[facility] [address] [place], [place], [address] Phone: [phone]   Fax:  [phone]  Physical Therapy Treatment  Patient Details  Name: Sabrina PeakKaren Gallagher MRN: [id number] Date of Birth: Mar 07, 1961 Referring Provider: Butch PennyMegan Millikan, NP  Encounter Date: 05/18/2016      PT End of Session - 05/18/16 1630    Visit Number 5   Number of Visits 9   Date for PT Re-Evaluation 05/25/16   Authorization Type UHC   PT Start Time 1532   PT Stop Time 1615   PT Time Calculation (min) 43 min   Activity Tolerance Patient tolerated treatment well   Behavior During Therapy [employer] for tasks assessed/performed      Past Medical History  Diagnosis Date  . Reflux   . Premature ovarian failure age 55  . Osteoporosis 07/2013    T score -2.9 AP spine  . Pap smear of cervix with ASCUS, cannot exclude HGSIL 08/2015    colposcopy biopsy 12:00 with LGSIL negative ECC  . Fatigue   . SOB (shortness of breath)   . Chest pain   . Abnormal EKG   . Diabetes mellitus without complication (HCC)   . Neuropathy [facility](HCC)     Past Surgical History  Procedure Laterality Date  . No surgical history      There were no vitals filed for this visit.      Subjective Assessment - 05/18/16 1537    Subjective Sitll soree/hurts more with prolonged sitting. Still occasionally having the senstations as stated last time (like something is brushing her leg)., mostly with prolonged sitting/driving. Has not heard back from MD to get lab results.   Diagnostic tests NCV and EMG done 04-24-16 -- negative but MD still feels small fiber neuropathy: CK test 1079 decr. to 395 with discontinuation of workout   Patient Stated Goals resolve R hamstring pain; be able to resume working out   Currently in Pain? Yes   Pain Score 3             [facility] Adult PT Treatment/Exercise - 05/18/16 1630    Knee/Hip Exercises: Stretches   Active Hamstring Stretch Right;3 reps;30  seconds;Limitations   Active Hamstring Stretch Limitations right leg in extension on mat with left foot on floor, pt reaching toward ankle until stretch is felt and holding this position.   Other Knee/Hip Stretches use of foam roller to work on hamstring tightness and IT band stretching as well.   Knee/Hip Exercises: Supine   Bridges AROM;Strengthening;Both;1 set;10 reps;Limitations  5 sec holds   Bridges Limitations cues on form and technique   Knee Flexion AROM;Strengthening;Both;1 set;10 reps;Limitations   Knee Flexion Limitations with feet on red pball: hamstring curls x 10 reps with 3 sec holds   Knee/Hip Exercises: Prone   Hamstring Curl 1 set;10 reps;Limitations   Hamstring Curl Limitations cues on form and to not hike hip, cues to slow down for increased muscle control/activation   Hip Extension AROM;Strengthening;Right;1 set;10 reps;Limitations   Hip Extension Limitations cues on form and technique   Other Prone Exercises glut kick backs, x 10 reps, cues on form and technique   Ultrasound   Ultrasound Location right hamstring   Ultrasound Parameters 100%, 1 mhz, 1.5 w/cm2   Ultrasound Goals Edema;Pain   Manual Therapy   Manual therapy comments strain-counter-strain to right hamstring and IT band for decreased trigger points, decreased tightness and to increase tissue extensibility   Soft tissue mobilization to right hamstring and IT  band for decreased edema and decreased tightness.             PT Long Term Goals - 04/29/16 1004    PT LONG TERM GOAL #1   Title Pt will report decr. RLE/hamstring pain to </= 3/10 to enable incr. tolerance with sitting for work day.  (05-25-16)   Baseline 6/10    Time 4   Period Weeks   Status New   PT LONG TERM GOAL #2   Title Pt will demonstrate incr. activity tolerance by participating in at least 30" activity of strengthening/cardio exercise in PT session with RLE pain </= 4/10.  (05-25-16)   Time 4   Period Weeks   Status New   PT  LONG TERM GOAL #3   Title Independen in HEP with pt able to resume workouts at gym.  (05-25-16)   Time 4   Period Weeks   Status New   PT LONG TERM GOAL #4   Title Improve FOTO score to >/= 72/100 to demo improvement in activity tolerance.  (05-25-16)   Baseline 66/100 on 04-25-16:  normal adjusted statistical 55/100   Time 4   Period Weeks   Status New               Plan - 05/18/16 1630    Clinical Impression Statement Today's session continued to address pain reduction and strengthening of right hamstring. Added new exercises today without any issues reported. Pt is making steady progress toward goals.    Rehab Potential Good   PT Frequency 2x / week   PT Duration 4 weeks   PT Treatment/Interventions ADLs/Self Care Home Management;Electrical Stimulation;Moist Heat;Ultrasound;Gait training;Stair training;Therapeutic activities;Therapeutic exercise;Balance training;Neuromuscular re-education;Patient/family education;Manual techniques   PT Next Visit Plan Ultrasound to R hamstring area; continue with hamstring stretching/manual therapy as neeed;  gentle  R hip/hamstring strengthening progressing as able   PT Home Exercise Plan RLE stretching and strengthening   Consulted and Agree with Plan of Care Patient      Patient will benefit from skilled therapeutic intervention in order to improve the following deficits and impairments:  Difficulty walking, Pain, Decreased strength  Visit Diagnosis: Pain In Right Leg  Localized swelling, mass and lump, right lower limb  Muscle weakness (generalized)  Other symptoms and signs involving the musculoskeletal system  Other abnormalities of gait and mobility     Problem List Patient Active Problem List   Diagnosis Date Noted  . Paresthesias 04/24/2016  . Muscle pain 04/24/2016  . Small fiber neuropathy (HCC) 01/31/2016  . B12 deficiency 01/31/2016  . Pre-diabetes 01/31/2016  . Reflux   . Premature ovarian failure   . OVARIAN  FAILURE, PREMATURE 06/23/2010  . HYPERLIPIDEMIA-MIXED 06/23/2010  . WEIGHT GAIN 06/23/2010  . HEARTBURN 06/23/2010    Sallyanne Kuster, PTA, [facility] [address], [address] [place], [place] [address] [phone] 05/19/2016, 12:21 PM   Name: Jennipher Weatherholtz MRN: [id number] Date of Birth: 01-31-1961

## 2016-05-22 ENCOUNTER — Ambulatory Visit: Payer: 59 | Admitting: Physical Therapy

## 2016-05-22 DIAGNOSIS — M79604 Pain in right leg: Secondary | ICD-10-CM | POA: Diagnosis not present

## 2016-05-22 DIAGNOSIS — R2689 Other abnormalities of gait and mobility: Secondary | ICD-10-CM

## 2016-05-22 DIAGNOSIS — M6281 Muscle weakness (generalized): Secondary | ICD-10-CM

## 2016-05-22 DIAGNOSIS — R2241 Localized swelling, mass and lump, right lower limb: Secondary | ICD-10-CM

## 2016-05-23 ENCOUNTER — Encounter: Payer: Self-pay | Admitting: Physical Therapy

## 2016-05-23 ENCOUNTER — Ambulatory Visit: Payer: 59 | Admitting: Physical Therapy

## 2016-05-23 NOTE — Therapy (Signed)
[facility] [address] [place], [place], [address] Phone: [phone]   Fax:  [phone]  Physical Therapy Treatment  Patient Details  Name: Sabrina Gallagher MRN: [id number] Date of Birth: 02-Jun-1961 Referring Provider: Butch Penny, NP  Encounter Date: 05/22/2016      PT End of Session - 05/23/16 2106    Visit Number 6   Number of Visits 9   Date for PT Re-Evaluation 05/25/16   Authorization Type UHC   PT Start Time 1535   PT Stop Time 1623   PT Time Calculation (min) 48 min      Past Medical History:  Diagnosis Date  . Abnormal EKG   . Chest pain   . Diabetes mellitus without complication (HCC)   . Fatigue   . Neuropathy (HCC)   . Osteoporosis 07/2013   T score -2.9 AP spine  . Pap smear of cervix with ASCUS, cannot exclude HGSIL 08/2015   colposcopy biopsy 12:00 with LGSIL negative ECC  . Premature ovarian failure age 49  . Reflux   . SOB (shortness of breath)     Past Surgical History:  Procedure Laterality Date  . No surgical history      There were no vitals filed for this visit.      Subjective Assessment - 05/23/16 2058    Subjective Pt reports she is getting better - drove 3 hours this weekend to see her daughter and says her leg did not hurt like it has in the past when she has been sitting for prolonged time period   Diagnostic tests NCV and EMG done 04-24-16 -- negative but MD still feels small fiber neuropathy: CK test 1079 decr. to 395 with discontinuation of workout   Patient Stated Goals resolve R hamstring pain; be able to resume working out   Currently in Pain? Yes   Pain Score 2    Pain Location Leg   Pain Orientation Right;Posterior   Pain Descriptors / Indicators Sore;Discomfort   Pain Type Chronic pain   Pain Onset More than a month ago   Pain Frequency Constant                         [facility] Adult PT Treatment/Exercise - 05/23/16 0001      Knee/Hip  Exercises: Stretches   Active Hamstring Stretch Right;1 rep;30 seconds   ITB Stretch Right;2 reps;30 seconds  in standing position   Gastroc Stretch Right;1 rep;30 seconds     Knee/Hip Exercises: Prone   Hamstring Curl 1 set;10 reps  emphasis on eccentric control with SLOW extension   Hip Extension Strengthening;Right;1 set;10 reps  in quadriped position -R knee flexed at 90 degrees     Ultrasound   Ultrasound Location R posterior hamstring   Ultrasound Parameters 100% cont. 1.5 w/cm2, 1 Mhz x 8"   Ultrasound Goals Edema;Pain     Quadriped position - pt performed R knee flexion/extension with RLE extended; hip extension with flexed knee at 90 degrees  As described above - 10 reps each exercise  Forward step ups onto 6" step RLE 10 reps; step downs for R eccentric quad/hamstring control 10 reps with use of hand rail at 6' step  Standing on BOSU - squats 10 reps with UE support prn inside bars  SLS on RLE - moving LLE up/back and laterally 10 reps with UE support  Amb. Forwards, backwards and sideways inside bars with both knees flexed - on tip  toes 2 reps each and then without plantarflexed position 2 reps each  Direction  Pt performed hamstring strengthening - sitting on rolling stool - 120' around track - using RLE to pull self forward by R knee flexion                 PT Long Term Goals - 04/29/16 1004      PT LONG TERM GOAL #1   Title Pt will report decr. RLE/hamstring pain to </= 3/10 to enable incr. tolerance with sitting for work day.  (05-25-16)   Baseline 6/10    Time 4   Period Weeks   Status New     PT LONG TERM GOAL #2   Title Pt will demonstrate incr. activity tolerance by participating in at least 30" activity of strengthening/cardio exercise in PT session with RLE pain </= 4/10.  (05-25-16)   Time 4   Period Weeks   Status New     PT LONG TERM GOAL #3   Title Independen in HEP with pt able to resume workouts at gym.  (05-25-16)   Time 4    Period Weeks   Status New     PT LONG TERM GOAL #4   Title Improve FOTO score to >/= 72/100 to demo improvement in activity tolerance.  (05-25-16)   Baseline 66/100 on 04-25-16:  normal adjusted statistical 55/100   Time 4   Period Weeks   Status New               Plan - 05/23/16 2107    Clinical Impression Statement Pt tolerated functional strengthening exercises well - reported exercises to be challenging and tiresome but stated that she felt really good at end of session - pt reporting that she felt hamstring muscle working during activities; pt demonstrating increased strength in RLE   Rehab Potential Good   PT Frequency 2x / week   PT Duration 4 weeks   PT Treatment/Interventions ADLs/Self Care Home Management;Electrical Stimulation;Moist Heat;Ultrasound;Gait training;Stair training;Therapeutic activities;Therapeutic exercise;Balance training;Neuromuscular re-education;Patient/family education;Manual techniques   PT Next Visit Plan Cont ultrasound to R hamstring: cont functional strengthening - progress PRE's with use of weights if pt has lab results (CK level) WNL's   PT Home Exercise Plan RLE stretching and strengthening   Consulted and Agree with Plan of Care Patient      Patient will benefit from skilled therapeutic intervention in order to improve the following deficits and impairments:  Difficulty walking, Pain, Decreased strength  Visit Diagnosis: Muscle weakness (generalized)  Localized swelling, mass and lump, right lower limb  Other abnormalities of gait and mobility     Problem List Patient Active Problem List   Diagnosis Date Noted  . Paresthesias 04/24/2016  . Muscle pain 04/24/2016  . Small fiber neuropathy (HCC) 01/31/2016  . B12 deficiency 01/31/2016  . Pre-diabetes 01/31/2016  . Reflux   . Premature ovarian failure   . OVARIAN FAILURE, PREMATURE 06/23/2010  . HYPERLIPIDEMIA-MIXED 06/23/2010  . WEIGHT GAIN 06/23/2010  . HEARTBURN 06/23/2010     Kary Kos, PT 05/23/2016, 9:19 PM  [facility] [address] [place], [place], [address] Phone: [phone]   Fax:  [phone]  Name: Sabrina Gallagher MRN: [id number] Date of Birth: January 17, 1961

## 2016-05-25 ENCOUNTER — Encounter: Payer: Self-pay | Admitting: Physical Therapy

## 2016-05-25 ENCOUNTER — Ambulatory Visit: Payer: 59 | Admitting: Physical Therapy

## 2016-05-25 DIAGNOSIS — R2241 Localized swelling, mass and lump, right lower limb: Secondary | ICD-10-CM

## 2016-05-25 DIAGNOSIS — M79604 Pain in right leg: Secondary | ICD-10-CM | POA: Diagnosis not present

## 2016-05-25 DIAGNOSIS — R2689 Other abnormalities of gait and mobility: Secondary | ICD-10-CM

## 2016-05-25 DIAGNOSIS — M6281 Muscle weakness (generalized): Secondary | ICD-10-CM

## 2016-05-25 DIAGNOSIS — R29898 Other symptoms and signs involving the musculoskeletal system: Secondary | ICD-10-CM

## 2016-05-28 NOTE — Therapy (Signed)
[facility] [address] [place], [place], [address] Phone: [phone]   Fax:  [phone]  Physical Therapy Treatment  Patient Details  Name: Dolce Sylvia MRN: [id number] Date of Birth: [birth year] Referring Provider: Butch Penny, NP  Encounter Date: 05/25/2016     05/25/16 1539  PT Visits / Re-Eval  Visit Number 7  Number of Visits 9  Date for PT Re-Evaluation 05/25/16  Authorization  Authorization Type UHC  PT Time Calculation  PT Start Time 1533  PT Stop Time 1615  PT Time Calculation (min) 42 min  PT - End of Session  Activity Tolerance Patient tolerated treatment well  Behavior During Therapy [facility] for tasks assessed/performed    Past Medical History:  Diagnosis Date  . Abnormal EKG   . Chest pain   . Diabetes mellitus without complication (HCC)   . Fatigue   . Neuropathy (HCC)   . Osteoporosis 07/2013   T score -2.9 AP spine  . Pap smear of cervix with ASCUS, cannot exclude HGSIL 08/2015   colposcopy biopsy 12:00 with LGSIL negative ECC  . Premature ovarian failure age 35  . Reflux   . SOB (shortness of breath)     Past Surgical History:  Procedure Laterality Date  . No surgical history      There were no vitals filed for this visit.     05/25/16 1537  Symptoms/Limitations  Subjective No new complaints. Sitting is much better now. Was tired/sore after last session.  Diagnostic tests NCV and EMG done 04-24-16 -- negative but MD still feels small fiber neuropathy: CK test 1079 decr. to 395 with discontinuation of workout  Patient Stated Goals resolve R hamstring pain; be able to resume working out  Pain Assessment  Currently in Pain? No/denies  Pain Score 2  Pain Location Leg  Pain Orientation Right;Posterior  Pain Descriptors / Indicators Sore;Tender  Pain Type Chronic pain  Pain Onset More than a month ago  Pain Frequency Intermittent  Aggravating Factors  longer periods of sitting  (>/= 4 hours)  Pain Relieving Factors standing, stretching      05/25/16 0001  Lumbar Exercises: Quadruped  Straight Leg Raise 10 reps;5 seconds  Straight Leg Raises Limitations right leg only  Other Quadruped Lumbar Exercises right leg glut kick backs x 10 reps   Knee/Hip Exercises: Machines for Strengthening  Other Machine elliptical level 1.5 x 1 minute each fwd/bwd with cues on tall posture  Knee/Hip Exercises: Standing  Functional Squat 1 set;10 reps;Other (comment);Limitations  Functional Squat Limitations squats with alteranating lateral kicks x 10 each side  Knee/Hip Exercises: Seated  Stool Scoot - Round Trips 120 feet fwd with right leg only, cues on posture and to decrease trunk rocking   Knee/Hip Exercises: Prone  Hamstring Curl 10 reps;Limitations (1# ankle weight used)  Hamstring Curl Limitations cues on form and to not hike hip, cues to slow down for increased muscle control/activation  Ultrasound  Ultrasound Location right hamstring  Ultrasound Parameters 100%, 1 mhz, 1.5 w/cm2 x 8 minutes  Ultrasound Goals Edema;Pain                                  PT Long Term Goals - 04/29/16 1004      PT LONG TERM GOAL #1   Title Pt will report decr. RLE/hamstring pain to </= 3/10 to enable incr. tolerance with sitting for work day.  (05-25-16)  Baseline 6/10    Time 4   Period Weeks   Status New     PT LONG TERM GOAL #2   Title Pt will demonstrate incr. activity tolerance by participating in at least 30" activity of strengthening/cardio exercise in PT session with RLE pain </= 4/10.  (05-25-16)   Time 4   Period Weeks   Status New     PT LONG TERM GOAL #3   Title Independen in HEP with pt able to resume workouts at gym.  (05-25-16)   Time 4   Period Weeks   Status New     PT LONG TERM GOAL #4   Title Improve FOTO score to >/= 72/100 to demo improvement in activity tolerance.  (05-25-16)   Baseline 66/100 on 04-25-16:  normal adjusted  statistical 55/100   Time 4   Period Weeks   Status New        05/25/16 1539  Plan  Clinical Impression Statement Today's session continued to work on decreased pain/edema and hamstring strengthening. No issues reported with session and advanced strengthening ex's performed. Pt is making great progress toward goals and on track for discharge next week per PT plan of care.  Pt will benefit from skilled therapeutic intervention in order to improve on the following deficits Difficulty walking;Pain;Decreased strength  Rehab Potential Good  PT Frequency 2x / week  PT Duration 4 weeks  PT Treatment/Interventions ADLs/Self Care Home Management;Electrical Stimulation;Moist Heat;Ultrasound;Gait training;Stair training;Therapeutic activities;Therapeutic exercise;Balance training;Neuromuscular re-education;Patient/family education;Manual techniques  PT Next Visit Plan continue with strengthening, adding in PRE's if CK levels are WNL's; assess goals for discharge next week  PT Home Exercise Plan RLE stretching and strengthening  Consulted and Agree with Plan of Care Patient     Patient will benefit from skilled therapeutic intervention in order to improve the following deficits and impairments:  Difficulty walking, Pain, Decreased strength  Visit Diagnosis: Muscle weakness (generalized)  Localized swelling, mass and lump, right lower limb  Other abnormalities of gait and mobility  Pain In Right Leg  Other symptoms and signs involving the musculoskeletal system     Problem List Patient Active Problem List   Diagnosis Date Noted  . Paresthesias 04/24/2016  . Muscle pain 04/24/2016  . Small fiber neuropathy (HCC) 01/31/2016  . B12 deficiency 01/31/2016  . Pre-diabetes 01/31/2016  . Reflux   . Premature ovarian failure   . OVARIAN FAILURE, PREMATURE 06/23/2010  . HYPERLIPIDEMIA-MIXED 06/23/2010  . WEIGHT GAIN 06/23/2010  . HEARTBURN 06/23/2010    Sallyanne Kuster, PTA, [facility]  Neuro [facility] [address] [place], [place] [address] [phone] 05/28/16, 2:29 PM   Name: Ryneisha Debski MRN: [id number] Date of Birth: 1961-07-28

## 2016-05-29 ENCOUNTER — Ambulatory Visit: Payer: 59 | Admitting: Physical Therapy

## 2016-05-29 DIAGNOSIS — M6281 Muscle weakness (generalized): Secondary | ICD-10-CM

## 2016-05-29 DIAGNOSIS — M79604 Pain in right leg: Secondary | ICD-10-CM | POA: Diagnosis not present

## 2016-05-29 DIAGNOSIS — R2689 Other abnormalities of gait and mobility: Secondary | ICD-10-CM

## 2016-05-29 DIAGNOSIS — R2241 Localized swelling, mass and lump, right lower limb: Secondary | ICD-10-CM

## 2016-05-30 ENCOUNTER — Ambulatory Visit: Payer: 59 | Admitting: Physical Therapy

## 2016-05-30 NOTE — Therapy (Signed)
[place] [address] [place], [place], [address] Phone: [phone]   Fax:  [phone]  Physical Therapy Treatment  Patient Details  Name: Sabrina Gallagher MRN: [id number] Date of Birth: 1961/01/16 Referring Provider: Ward Givens, NP  Encounter Date: 05/29/2016      PT End of Session - 05/30/16 2300    Visit Number 8   Number of Visits 9   Date for PT Re-Evaluation 05/25/16   Authorization Type UHC   PT Start Time 5462   PT Stop Time 1622   PT Time Calculation (min) 47 min      Past Medical History:  Diagnosis Date  . Abnormal EKG   . Chest pain   . Diabetes mellitus without complication ([employer])   . Fatigue   . Neuropathy ([place])   . Osteoporosis 07/2013   T score -2.9 AP spine  . Pap smear of cervix with ASCUS, cannot exclude HGSIL 08/2015   colposcopy biopsy 12:00 with LGSIL negative ECC  . Premature ovarian failure age 55  . Reflux   . SOB (shortness of breath)     Past Surgical History:  Procedure Laterality Date  . No surgical history      There were no vitals filed for this visit.      Subjective Assessment - 05/30/16 2255    Subjective Pt reports CPK level is now decreased to 115 - is doing much better   Diagnostic tests NCV and EMG done 04-24-16 -- negative but MD still feels small fiber neuropathy: CK test 1079 decr. to 395 with discontinuation of workout   Patient Stated Goals resolve R hamstring pain; be able to resume working out   Currently in Pain? No/denies                         [facility] Adult PT Treatment/Exercise - 05/30/16 0001      Knee/Hip Exercises: Stretches   Active Hamstring Stretch Right;1 rep;30 seconds     Knee/Hip Exercises: Machines for Strengthening   Other Machine Elliptical 2" forward and 1" backward level 1.5     Knee/Hip Exercises: Standing   Forward Step Up Right;1 set;10 reps;Step Height: 6"   Functional Squat 1 set;10 reps;Other  (comment);Limitations   Functional Squat Limitations squats with alteranating lateral kicks x 10 each side     Knee/Hip Exercises: Prone   Hamstring Curl 10 reps  3# weight used on RLE   Hip Extension Strengthening;Right;1 set;10 reps  in quadriped position -R knee flexed at 90 degrees     Modalities   Modalities Ultrasound     Ultrasound   Ultrasound Location R hamstring   Ultrasound Parameters 100% continuous  8" 1.5 w/cm2   Ultrasound Goals Edema;Pain                     PT Long Term Goals - 05/30/16 2301      PT LONG TERM GOAL #1   Title Pt will report decr. RLE/hamstring pain to </= 3/10 to enable incr. tolerance with sitting for work day.  (05-25-16)   Baseline minimal to no c/o pain reported - only with prolonged sitting position   Status Achieved     PT LONG TERM GOAL #2   Title Pt will demonstrate incr. activity tolerance by participating in at least 30" activity of strengthening/cardio exercise in PT session with RLE pain </= 4/10.  (05-25-16)   Status Achieved  PT LONG TERM GOAL #3   Title Independen in HEP with pt able to resume workouts at gym.  (05-25-16)   Status Achieved     PT LONG TERM GOAL #4   Title Improve FOTO score to >/= 72/100 to demo improvement in activity tolerance.  (05-25-16)   Status Deferred               Plan - 05/30/16 2302    Clinical Impression Statement Pt has met all LTG's - very minimal to no pain reported in R hamstring area - pt able to return to gym with low level intensity exercise program   Rehab Potential Good   PT Frequency 2x / week   PT Duration 4 weeks   PT Treatment/Interventions ADLs/Self Care Home Management;Electrical Stimulation;Moist Heat;Ultrasound;Gait training;Stair training;Therapeutic activities;Therapeutic exercise;Balance training;Neuromuscular re-education;Patient/family education;Manual techniques   PT Next Visit Plan D/C - goals met   PT Home Exercise Plan RLE stretching and  strengthening   Consulted and Agree with Plan of Care Patient      Patient will benefit from skilled therapeutic intervention in order to improve the following deficits and impairments:  Difficulty walking, Pain, Decreased strength  Visit Diagnosis: Muscle weakness (generalized)  Localized swelling, mass and lump, right lower limb  Other abnormalities of gait and mobility     Problem List Patient Active Problem List   Diagnosis Date Noted  . Paresthesias 04/24/2016  . Muscle pain 04/24/2016  . Small fiber neuropathy ([place]) 01/31/2016  . B12 deficiency 01/31/2016  . Pre-diabetes 01/31/2016  . Reflux   . Premature ovarian failure   . OVARIAN FAILURE, PREMATURE 06/23/2010  . HYPERLIPIDEMIA-MIXED 06/23/2010  . WEIGHT GAIN 06/23/2010  . HEARTBURN 06/23/2010    PHYSICAL THERAPY DISCHARGE SUMMARY  Visits from Start of Care: 8  Current functional level related to goals / functional outcomes: All LTG's achieved - see above for progress   Remaining deficits: Minimal c/o pain in R hamstring area with prolonged sitting   Education / Equipment: Pt has been educated in HEP for R hamstring stretching and strengthening - pt is able to return to gym for low level exercise program Plan: Patient agrees to discharge.  Patient goals were met. Patient is being discharged due to meeting the stated rehab goals.  ?????       Alda Lea, PT 05/30/2016, 11:05 PM  [place] [address] [place], [place], [address] Phone: [phone]   Fax:  [phone]  Name: Oleta Gunnoe MRN: [id number] Date of Birth: July 13, 1948

## 2016-06-01 ENCOUNTER — Ambulatory Visit: Payer: 59 | Admitting: Physical Therapy

## 2016-06-02 ENCOUNTER — Ambulatory Visit: Payer: 59 | Admitting: Physical Therapy

## 2016-06-02 ENCOUNTER — Encounter: Payer: Self-pay | Admitting: Physical Therapy

## 2016-06-02 DIAGNOSIS — R2241 Localized swelling, mass and lump, right lower limb: Secondary | ICD-10-CM | POA: Insufficient documentation

## 2016-06-02 DIAGNOSIS — M6281 Muscle weakness (generalized): Secondary | ICD-10-CM | POA: Insufficient documentation

## 2016-06-02 DIAGNOSIS — R29898 Other symptoms and signs involving the musculoskeletal system: Secondary | ICD-10-CM | POA: Insufficient documentation

## 2016-06-02 DIAGNOSIS — M79604 Pain in right leg: Secondary | ICD-10-CM | POA: Insufficient documentation

## 2016-06-02 DIAGNOSIS — R2689 Other abnormalities of gait and mobility: Secondary | ICD-10-CM | POA: Insufficient documentation

## 2016-06-02 NOTE — Therapy (Signed)
[facility] [address] [place], [place], [address] Phone: [phone]   Fax:  [phone]  Physical Therapy Treatment  Patient Details  Name: Sabrina Gallagher MRN: [id number] Date of Birth: Mar 13, 1961 Referring Provider: Butch Penny, NP  Encounter Date: 06/02/2016    Past Medical History:  Diagnosis Date  . Abnormal EKG   . Chest pain   . Diabetes mellitus without complication (HCC)   . Fatigue   . Neuropathy (HCC)   . Osteoporosis 07/2013   T score -2.9 AP spine  . Pap smear of cervix with ASCUS, cannot exclude HGSIL 08/2015   colposcopy biopsy 12:00 with LGSIL negative ECC  . Premature ovarian failure age 22  . Reflux   . SOB (shortness of breath)     Past Surgical History:  Procedure Laterality Date  . No surgical history      There were no vitals filed for this visit.      Subjective Assessment - 06/02/16 1106    Subjective No new complaints. Has not returned to gym as yet, planning too.   Diagnostic tests NCV and EMG done 04-24-16 -- negative but MD still feels small fiber neuropathy: CK test 1079 decr. to 395 with discontinuation of workout   Patient Stated Goals resolve R hamstring pain; be able to resume working out   Currently in Pain? No/denies   Pain Score 0-No pain      Pt arrived for session. On chart review of last session was noted pt was d/c'd that visit and does not need to be seen. Apt for today cancelled after encounter was opened. No charge for today.          PT Long Term Goals - 05/30/16 2301      PT LONG TERM GOAL #1   Title Pt will report decr. RLE/hamstring pain to </= 3/10 to enable incr. tolerance with sitting for work day.  (05-25-16)   Baseline minimal to no c/o pain reported - only with prolonged sitting position   Status Achieved     PT LONG TERM GOAL #2   Title Pt will demonstrate incr. activity tolerance by participating in at least 30" activity of  strengthening/cardio exercise in PT session with RLE pain </= 4/10.  (05-25-16)   Status Achieved     PT LONG TERM GOAL #3   Title Independen in HEP with pt able to resume workouts at gym.  (05-25-16)   Status Achieved     PT LONG TERM GOAL #4   Title Improve FOTO score to >/= 72/100 to demo improvement in activity tolerance.  (05-25-16)   Status Deferred        Visit Diagnosis: Muscle weakness (generalized)  Localized swelling, mass and lump, right lower limb  Other abnormalities of gait and mobility  Pain In Right Leg  Other symptoms and signs involving the musculoskeletal system     Problem List Patient Active Problem List   Diagnosis Date Noted  . Paresthesias 04/24/2016  . Muscle pain 04/24/2016  . Small fiber neuropathy (HCC) 01/31/2016  . B12 deficiency 01/31/2016  . Pre-diabetes 01/31/2016  . Reflux   . Premature ovarian failure   . OVARIAN FAILURE, PREMATURE 06/23/2010  . HYPERLIPIDEMIA-MIXED 06/23/2010  . WEIGHT GAIN 06/23/2010  . HEARTBURN 06/23/2010    Sallyanne Kuster, PTA, [facility] [address], [address] [place], [place] [address] [phone] 06/02/16, 11:13 AM   Name: Sabrina Gallagher MRN: [id number] Date of Birth: Dec 20, 1960

## 2016-07-06 ENCOUNTER — Ambulatory Visit: Payer: 59 | Admitting: Neurology

## 2016-08-01 ENCOUNTER — Other Ambulatory Visit: Payer: Self-pay | Admitting: Gynecology

## 2016-08-01 DIAGNOSIS — Z1231 Encounter for screening mammogram for malignant neoplasm of breast: Secondary | ICD-10-CM

## 2016-08-14 ENCOUNTER — Ambulatory Visit
Admission: RE | Admit: 2016-08-14 | Discharge: 2016-08-14 | Disposition: A | Discharge status: Home / Self Care | Payer: 59 | Source: Ambulatory Visit | Attending: Gynecology | Admitting: Gynecology

## 2016-08-14 DIAGNOSIS — Z1231 Encounter for screening mammogram for malignant neoplasm of breast: Secondary | ICD-10-CM

## 2016-09-19 ENCOUNTER — Encounter: Payer: Self-pay | Admitting: Gynecology

## 2016-09-19 ENCOUNTER — Ambulatory Visit (INDEPENDENT_AMBULATORY_CARE_PROVIDER_SITE_OTHER): Payer: 59 | Admitting: Gynecology

## 2016-09-19 VITALS — BP 122/76 | Ht 65.0 in | Wt 188.0 lb

## 2016-09-19 DIAGNOSIS — N898 Other specified noninflammatory disorders of vagina: Secondary | ICD-10-CM | POA: Diagnosis not present

## 2016-09-19 DIAGNOSIS — Z01419 Encounter for gynecological examination (general) (routine) without abnormal findings: Secondary | ICD-10-CM | POA: Diagnosis not present

## 2016-09-19 DIAGNOSIS — M810 Age-related osteoporosis without current pathological fracture: Secondary | ICD-10-CM

## 2016-09-19 DIAGNOSIS — Z13220 Encounter for screening for lipoid disorders: Secondary | ICD-10-CM | POA: Diagnosis not present

## 2016-09-19 LAB — CBC WITH DIFFERENTIAL/PLATELET
BASOS ABS: 56 {cells}/uL (ref 0–200)
Basophils Relative: 1 %
EOS PCT: 1 %
Eosinophils Absolute: 56 cells/uL (ref 15–500)
HCT: 38 % (ref 35.0–45.0)
HEMOGLOBIN: 12.4 g/dL (ref 11.7–15.5)
LYMPHS ABS: 2968 {cells}/uL (ref 850–3900)
Lymphocytes Relative: 53 %
MCH: 28.4 pg (ref 27.0–33.0)
MCHC: 32.6 g/dL (ref 32.0–36.0)
MCV: 87.2 fL (ref 80.0–100.0)
MONOS PCT: 8 %
MPV: 10.5 fL (ref 7.5–12.5)
Monocytes Absolute: 448 cells/uL (ref 200–950)
NEUTROS ABS: 2072 {cells}/uL (ref 1500–7800)
Neutrophils Relative %: 37 %
PLATELETS: 251 10*3/uL (ref 140–400)
RBC: 4.36 MIL/uL (ref 3.80–5.10)
RDW: 14.5 % (ref 11.0–15.0)
WBC: 5.6 10*3/uL (ref 3.8–10.8)

## 2016-09-19 LAB — COMPREHENSIVE METABOLIC PANEL
ALT: 16 U/L (ref 6–29)
AST: 19 U/L (ref 10–35)
Albumin: 4 g/dL (ref 3.6–5.1)
Alkaline Phosphatase: 63 U/L (ref 33–130)
BILIRUBIN TOTAL: 0.3 mg/dL (ref 0.2–1.2)
BUN: 11 mg/dL (ref 7–25)
CHLORIDE: 103 mmol/L (ref 98–110)
CO2: 27 mmol/L (ref 20–31)
CREATININE: 0.76 mg/dL (ref 0.50–1.05)
Calcium: 9.4 mg/dL (ref 8.6–10.4)
Glucose, Bld: 69 mg/dL (ref 65–99)
Potassium: 4.2 mmol/L (ref 3.5–5.3)
SODIUM: 138 mmol/L (ref 135–146)
TOTAL PROTEIN: 6.9 g/dL (ref 6.1–8.1)

## 2016-09-19 LAB — LIPID PANEL
CHOLESTEROL: 203 mg/dL — AB (ref <200)
HDL: 52 mg/dL (ref >50)
LDL CALC: 121 mg/dL — AB (ref <100)
Total CHOL/HDL Ratio: 3.9 Ratio (ref <5.0)
Triglycerides: 148 mg/dL (ref <150)
VLDL: 30 mg/dL (ref <30)

## 2016-09-19 LAB — TSH: TSH: 1.59 m[IU]/L

## 2016-09-19 LAB — WET PREP FOR TRICH, YEAST, CLUE
Clue Cells Wet Prep HPF POC: NONE SEEN
TRICH WET PREP: NONE SEEN
YEAST WET PREP: NONE SEEN

## 2016-09-19 MED ORDER — METRONIDAZOLE 500 MG PO TABS: 500.0000 mg | ORAL_TABLET | Freq: Two times a day (BID) | ORAL | 0 refills | Status: DC

## 2016-09-19 NOTE — Progress Notes (Signed)
    Sabrina PeakKaren Gallagher 14-Sep-1961 [id number]        55 y.o.  [id number]  for annual exam.  Also complaining of several weeks of vaginal discharge with odor and irritation. Mild itching. No urinary symptoms such as frequency dysuria or urgency. No low back pain fever or chills. No nausea vomiting diarrhea constipation  Past medical history,surgical history, problem list, medications, allergies, family history and social history were all reviewed and documented as reviewed in the EPIC chart.  ROS:  Performed with pertinent positives and negatives included in the history, assessment and plan.   Additional significant findings :  None   Exam: Sabrina PortelaKim Gallagher assistant Vitals:   09/19/16 1539  BP: 122/76  Weight: 188 lb (85.3 kg)  Height: 5\' 5"  (1.651 m)   Body mass index is 31.28 kg/m.  General appearance:  Normal affect, orientation and appearance. Skin: Grossly normal HEENT: Without gross lesions.  No cervical or supraclavicular adenopathy. Thyroid normal.  Lungs:  Clear without wheezing, rales or rhonchi Cardiac: RR, without RMG Abdominal:  Soft, nontender, without masses, guarding, rebound, organomegaly or hernia Breasts:  Examined lying and sitting without masses, retractions, discharge or axillary adenopathy. Pelvic:  Ext, BUS, Vagina with atrophic changes. White discharge noted.  Cervix normal. Pap smear done  Uterus anteverted, normal size, shape and contour, midline and mobile nontender   Adnexa without masses or tenderness    Anus and perineum normal   Rectovaginal normal sphincter tone without palpated masses or tenderness.    Assessment/Plan:  55 y.o. [id number] female for annual exam.   1. History of premature menopause. Mild atrophic changes. No significant hot flushes, night sweats, vaginal dryness or any vaginal bleeding. Continue to monitor report any issues or bleeding. 2. Vaginal discharge with odor and irritation.  Does have a whitish discharge on exam. Wet preps really  unremarkable. I suspect she has a low-grade bacterial vaginosis. Will cover with Flagyl 500 mg twice a day 7 days at her request. Alcohol avoidance reviewed. Follow up if symptoms persist, worsen or recur. 3. Pap smear 2016 with ascus cannot rule out high-grade lesion. Negative HPV. Colposcopy with biopsy showing LGSIL with negative ECC. Pap smear done today. Will triage based upon results. No other history of significant abnormalities. 4. Osteoporosis. DEXA 09/2015 T score -2.7 stable from prior DEXA. As per multiple discussions patient is not interested in treatment at this point. Understands the fracture risk issues. Osteoporosis attributable to her premature menopause in the 30s with no significant HRT afterwards. Check vitamin D level today. Plan repeat DEXA next year at 2 year interval. 5. Mammography 07/2016. Continue with annual mammography when due. SBE monthly reviewed. 6. Colonoscopy 2015. Repeat at their recommended interval.  7. Health maintenance. Patient requests baseline labs. CBC, CMP, lipid profile, TSH, vitamin D, urinalysis ordered. Follow up in one year, sooner as needed.  Additional time in excess of her routine gynecologic exam was spent in direct face to face counseling and coordination of care in regards to her vaginal discharge, evaluation and treatment with medication.    Sabrina LordsFONTAINE,Sabrina Gallagher P MD, 3:58 PM 09/19/2016

## 2016-09-19 NOTE — Addendum Note (Signed)
Addended by: Dayna BarkerGARDNER, Ceriah Kohler K on: 09/19/2016 04:19 PM   Modules accepted: Orders

## 2016-09-19 NOTE — Addendum Note (Signed)
Addended by: Dayna BarkerGARDNER, KIMBERLY K on: 09/19/2016 04:17 PM   Modules accepted: Orders

## 2016-09-19 NOTE — Patient Instructions (Signed)
Take the Flagyl medication twice daily for 7 days. Avoid alcohol while taking. Follow up if your vaginal symptoms continue.  You may obtain a copy of any labs that were done today by logging onto MyChart as outlined in the instructions provided with your AVS (after visit summary). The office will not call with normal lab results but certainly if there are any significant abnormalities then we will contact you.   Health Maintenance Adopting a healthy lifestyle and getting preventive care can go a long way to promote health and wellness. Talk with your health care provider about what schedule of regular examinations is right for you. This is a good chance for you to check in with your provider about disease prevention and staying healthy. In between checkups, there are plenty of things you can do on your own. Experts have done a lot of research about which lifestyle changes and preventive measures are most likely to keep you healthy. Ask your health care provider for more information. WEIGHT AND DIET  Eat a healthy diet  Be sure to include plenty of vegetables, fruits, low-fat dairy products, and lean protein.  Do not eat a lot of foods high in solid fats, added sugars, or salt.  Get regular exercise. This is one of the most important things you can do for your health.  Most adults should exercise for at least 150 minutes each week. The exercise should increase your heart rate and make you sweat (moderate-intensity exercise).  Most adults should also do strengthening exercises at least twice a week. This is in addition to the moderate-intensity exercise.  Maintain a healthy weight  Body mass index (BMI) is a measurement that can be used to identify possible weight problems. It estimates body fat based on height and weight. Your health care provider can help determine your BMI and help you achieve or maintain a healthy weight.  For females 51 years of age and older:   A BMI below 18.5 is  considered underweight.  A BMI of 18.5 to 24.9 is normal.  A BMI of 25 to 29.9 is considered overweight.  A BMI of 30 and above is considered obese.  Watch levels of cholesterol and blood lipids  You should start having your blood tested for lipids and cholesterol at 54 years of age, then have this test every 5 years.  You may need to have your cholesterol levels checked more often if:  Your lipid or cholesterol levels are high.  You are older than 54 years of age.  You are at high risk for heart disease.  CANCER SCREENING   Lung Cancer  Lung cancer screening is recommended for adults 35-45 years old who are at high risk for lung cancer because of a history of smoking.  A yearly low-dose CT scan of the lungs is recommended for people who:  Currently smoke.  Have quit within the past 15 years.  Have at least a 30-pack-year history of smoking. A pack year is smoking an average of one pack of cigarettes a day for 1 year.  Yearly screening should continue until it has been 15 years since you quit.  Yearly screening should stop if you develop a health problem that would prevent you from having lung cancer treatment.  Breast Cancer  Practice breast self-awareness. This means understanding how your breasts normally appear and feel.  It also means doing regular breast self-exams. Let your health care provider know about any changes, no matter how small.  If you  are in your 20s or 30s, you should have a clinical breast exam (CBE) by a health care provider every 1-3 years as part of a regular health exam.  If you are 65 or older, have a CBE every year. Also consider having a breast X-ray (mammogram) every year.  If you have a family history of breast cancer, talk to your health care provider about genetic screening.  If you are at high risk for breast cancer, talk to your health care provider about having an MRI and a mammogram every year.  Breast cancer gene (BRCA)  assessment is recommended for women who have family members with BRCA-related cancers. BRCA-related cancers include:  Breast.  Ovarian.  Tubal.  Peritoneal cancers.  Results of the assessment will determine the need for genetic counseling and BRCA1 and BRCA2 testing. Cervical Cancer Routine pelvic examinations to screen for cervical cancer are no longer recommended for nonpregnant women who are considered low risk for cancer of the pelvic organs (ovaries, uterus, and vagina) and who do not have symptoms. A pelvic examination may be necessary if you have symptoms including those associated with pelvic infections. Ask your health care provider if a screening pelvic exam is right for you.   The Pap test is the screening test for cervical cancer for women who are considered at risk.  If you had a hysterectomy for a problem that was not cancer or a condition that could lead to cancer, then you no longer need Pap tests.  If you are older than 65 years, and you have had normal Pap tests for the past 10 years, you no longer need to have Pap tests.  If you have had past treatment for cervical cancer or a condition that could lead to cancer, you need Pap tests and screening for cancer for at least 20 years after your treatment.  If you no longer get a Pap test, assess your risk factors if they change (such as having a new sexual partner). This can affect whether you should start being screened again.  Some women have medical problems that increase their chance of getting cervical cancer. If this is the case for you, your health care provider may recommend more frequent screening and Pap tests.  The human papillomavirus (HPV) test is another test that may be used for cervical cancer screening. The HPV test looks for the virus that can cause cell changes in the cervix. The cells collected during the Pap test can be tested for HPV.  The HPV test can be used to screen women 79 years of age and older.  Getting tested for HPV can extend the interval between normal Pap tests from three to five years.  An HPV test also should be used to screen women of any age who have unclear Pap test results.  After 54 years of age, women should have HPV testing as often as Pap tests.  Colorectal Cancer  This type of cancer can be detected and often prevented.  Routine colorectal cancer screening usually begins at 54 years of age and continues through 54 years of age.  Your health care provider may recommend screening at an earlier age if you have risk factors for colon cancer.  Your health care provider may also recommend using home test kits to check for hidden blood in the stool.  A small camera at the end of a tube can be used to examine your colon directly (sigmoidoscopy or colonoscopy). This is done to check for the earliest  forms of colorectal cancer.  Routine screening usually begins at age 73.  Direct examination of the colon should be repeated every 5-10 years through 54 years of age. However, you may need to be screened more often if early forms of precancerous polyps or small growths are found. Skin Cancer  Check your skin from head to toe regularly.  Tell your health care provider about any new moles or changes in moles, especially if there is a change in a mole's shape or color.  Also tell your health care provider if you have a mole that is larger than the size of a pencil eraser.  Always use sunscreen. Apply sunscreen liberally and repeatedly throughout the day.  Protect yourself by wearing long sleeves, pants, a wide-brimmed hat, and sunglasses whenever you are outside. HEART DISEASE, DIABETES, AND HIGH BLOOD PRESSURE   Have your blood pressure checked at least every 1-2 years. High blood pressure causes heart disease and increases the risk of stroke.  If you are between 41 years and 22 years old, ask your health care provider if you should take aspirin to prevent  strokes.  Have regular diabetes screenings. This involves taking a blood sample to check your fasting blood sugar level.  If you are at a normal weight and have a low risk for diabetes, have this test once every three years after 54 years of age.  If you are overweight and have a high risk for diabetes, consider being tested at a younger age or more often. PREVENTING INFECTION  Hepatitis B  If you have a higher risk for hepatitis B, you should be screened for this virus. You are considered at high risk for hepatitis B if:  You were born in a country where hepatitis B is common. Ask your health care provider which countries are considered high risk.  Your parents were born in a high-risk country, and you have not been immunized against hepatitis B (hepatitis B vaccine).  You have HIV or AIDS.  You use needles to inject street drugs.  You live with someone who has hepatitis B.  You have had sex with someone who has hepatitis B.  You get hemodialysis treatment.  You take certain medicines for conditions, including cancer, organ transplantation, and autoimmune conditions. Hepatitis C  Blood testing is recommended for:  Everyone born from [birth year] through 1964.  Anyone with known risk factors for hepatitis C. Sexually transmitted infections (STIs)  You should be screened for sexually transmitted infections (STIs) including gonorrhea and chlamydia if:  You are sexually active and are younger than 54 years of age.  You are older than 54 years of age and your health care provider tells you that you are at risk for this type of infection.  Your sexual activity has changed since you were last screened and you are at an increased risk for chlamydia or gonorrhea. Ask your health care provider if you are at risk.  If you do not have HIV, but are at risk, it may be recommended that you take a prescription medicine daily to prevent HIV infection. This is called pre-exposure prophylaxis  (PrEP). You are considered at risk if:  You are sexually active and do not regularly use condoms or know the HIV status of your partner(s).  You take drugs by injection.  You are sexually active with a partner who has HIV. Talk with your health care provider about whether you are at high risk of being infected with HIV. If you choose to  begin PrEP, you should first be tested for HIV. You should then be tested every 3 months for as long as you are taking PrEP.  PREGNANCY   If you are premenopausal and you may become pregnant, ask your health care provider about preconception counseling.  If you may become pregnant, take 400 to 800 micrograms (mcg) of folic acid every day.  If you want to prevent pregnancy, talk to your health care provider about birth control (contraception). OSTEOPOROSIS AND MENOPAUSE   Osteoporosis is a disease in which the bones lose minerals and strength with aging. This can result in serious bone fractures. Your risk for osteoporosis can be identified using a bone density scan.  If you are 62 years of age or older, or if you are at risk for osteoporosis and fractures, ask your health care provider if you should be screened.  Ask your health care provider whether you should take a calcium or vitamin D supplement to lower your risk for osteoporosis.  Menopause may have certain physical symptoms and risks.  Hormone replacement therapy may reduce some of these symptoms and risks. Talk to your health care provider about whether hormone replacement therapy is right for you.  HOME CARE INSTRUCTIONS   Schedule regular health, dental, and eye exams.  Stay current with your immunizations.   Do not use any tobacco products including cigarettes, chewing tobacco, or electronic cigarettes.  If you are pregnant, do not drink alcohol.  If you are breastfeeding, limit how much and how often you drink alcohol.  Limit alcohol intake to no more than 1 drink per day for  nonpregnant women. One drink equals 12 ounces of beer, 5 ounces of wine, or 1 ounces of hard liquor.  Do not use street drugs.  Do not share needles.  Ask your health care provider for help if you need support or information about quitting drugs.  Tell your health care provider if you often feel depressed.  Tell your health care provider if you have ever been abused or do not feel safe at home. Document Released: 05/01/2011 Document Revised: 03/02/2014 Document Reviewed: 09/17/2013 [facility] Patient Information 2015 [place], [place]. This information is not intended to replace advice given to you by your health care provider. Make sure you discuss any questions you have with your health care provider.

## 2016-09-20 LAB — URINALYSIS W MICROSCOPIC + REFLEX CULTURE
BACTERIA UA: NONE SEEN [HPF]
BILIRUBIN URINE: NEGATIVE
CRYSTALS: NONE SEEN [HPF]
Casts: NONE SEEN [LPF]
GLUCOSE, UA: NEGATIVE
Hgb urine dipstick: NEGATIVE
Ketones, ur: NEGATIVE
Nitrite: NEGATIVE
PROTEIN: NEGATIVE
RBC / HPF: NONE SEEN RBC/HPF (ref <=2)
SPECIFIC GRAVITY, URINE: 1.015 (ref 1.001–1.035)
YEAST: NONE SEEN [HPF]
pH: 6 (ref 5.0–8.0)

## 2016-09-20 LAB — VITAMIN D 25 HYDROXY (VIT D DEFICIENCY, FRACTURES): Vit D, 25-Hydroxy: 56 ng/mL (ref 30–100)

## 2016-09-21 LAB — URINE CULTURE

## 2016-09-25 LAB — PAP IG W/ RFLX HPV ASCU

## 2017-02-28 DIAGNOSIS — K219 Gastro-esophageal reflux disease without esophagitis: Secondary | ICD-10-CM | POA: Diagnosis not present

## 2017-05-07 DIAGNOSIS — H10411 Chronic giant papillary conjunctivitis, right eye: Secondary | ICD-10-CM | POA: Diagnosis not present

## 2017-05-07 DIAGNOSIS — H16141 Punctate keratitis, right eye: Secondary | ICD-10-CM | POA: Diagnosis not present

## 2017-06-01 ENCOUNTER — Emergency Department (HOSPITAL_COMMUNITY): Payer: 59

## 2017-06-01 ENCOUNTER — Encounter (HOSPITAL_COMMUNITY): Payer: Self-pay | Admitting: Emergency Medicine

## 2017-06-01 ENCOUNTER — Emergency Department (HOSPITAL_COMMUNITY)
Admission: EM | Admit: 2017-06-01 | Discharge: 2017-06-01 | Disposition: A | Discharge status: Home / Self Care | Payer: 59 | Attending: Emergency Medicine | Admitting: Emergency Medicine

## 2017-06-01 DIAGNOSIS — G4489 Other headache syndrome: Secondary | ICD-10-CM

## 2017-06-01 DIAGNOSIS — R072 Precordial pain: Secondary | ICD-10-CM | POA: Insufficient documentation

## 2017-06-01 DIAGNOSIS — R079 Chest pain, unspecified: Secondary | ICD-10-CM | POA: Diagnosis not present

## 2017-06-01 LAB — I-STAT TROPONIN, ED
TROPONIN I, POC: 0 ng/mL (ref 0.00–0.08)
Troponin i, poc: 0 ng/mL (ref 0.00–0.08)

## 2017-06-01 LAB — BASIC METABOLIC PANEL
Anion gap: 11 (ref 5–15)
BUN: 10 mg/dL (ref 6–20)
CHLORIDE: 101 mmol/L (ref 101–111)
CO2: 26 mmol/L (ref 22–32)
CREATININE: 0.76 mg/dL (ref 0.44–1.00)
Calcium: 9.5 mg/dL (ref 8.9–10.3)
Glucose, Bld: 104 mg/dL — ABNORMAL HIGH (ref 65–99)
POTASSIUM: 3.7 mmol/L (ref 3.5–5.1)
SODIUM: 138 mmol/L (ref 135–145)

## 2017-06-01 LAB — CBC
HCT: 39.6 % (ref 36.0–46.0)
Hemoglobin: 13.3 g/dL (ref 12.0–15.0)
MCH: 29.2 pg (ref 26.0–34.0)
MCHC: 33.6 g/dL (ref 30.0–36.0)
MCV: 86.8 fL (ref 78.0–100.0)
PLATELETS: 247 10*3/uL (ref 150–400)
RBC: 4.56 MIL/uL (ref 3.87–5.11)
RDW: 13.6 % (ref 11.5–15.5)
WBC: 7 10*3/uL (ref 4.0–10.5)

## 2017-06-01 NOTE — ED Triage Notes (Signed)
P c/o chest pain and bilateral upper and lower extremity tightness that woke her from sleep tonight. Reports nausea and dizziness, denies shortness of breath.

## 2017-06-01 NOTE — ED Provider Notes (Signed)
[facility]-EMERGENCY DEPT Provider Note   CSN: [id number] Arrival date & time: 06/01/17  0240     History   Chief Complaint Chief Complaint  Patient presents with  . Chest Pain    HPI Sabrina Gallagher is a 56 y.o. female.  The history is provided by the patient.  Chest Pain   The current episode started 1 to 2 hours ago. The problem occurs constantly. The problem has been gradually improving. The pain is present in the substernal region. The pain is mild. Quality: tightness. The pain does not radiate. Associated symptoms include headaches. Pertinent negatives include no abdominal pain, no cough, no fever, no shortness of breath, no syncope, no vomiting and no weakness. She has tried nothing for the symptoms.  patient reports she went to bed feeling well She ate [place] food for dinner for the first time in about 2 yrs She woke up with mild HA, chest tightness, arm/leg tightness No SOB No vomiting No focal weakness She is now improving She reports similar episode to prior exposures to [place] food No h/o CAD/CVA/PE  Past Medical History:  Diagnosis Date  . Abnormal EKG   . Fatigue   . Osteoporosis 07/2013   T score -2.9 AP spine  . Pap smear of cervix with ASCUS, cannot exclude HGSIL 08/2015   colposcopy biopsy 12:00 with LGSIL negative ECC  . Premature ovarian failure age 56  . SOB (shortness of breath)     Patient Active Problem List   Diagnosis Date Noted  . Paresthesias 04/24/2016  . Muscle pain 04/24/2016  . Small fiber neuropathy 01/31/2016  . B12 deficiency 01/31/2016  . Pre-diabetes 01/31/2016  . Reflux   . Premature ovarian failure   . OVARIAN FAILURE, PREMATURE 06/23/2010  . HYPERLIPIDEMIA-MIXED 06/23/2010  . WEIGHT GAIN 06/23/2010  . HEARTBURN 06/23/2010    Past Surgical History:  Procedure Laterality Date  . No surgical history      OB History    Gravida Para Term Preterm AB Living   3 2 2   1 2    SAB TAB Ectopic Multiple Live Births                   Home Medications    Prior to Admission medications   Medication Sig Start Date End Date Taking? Authorizing Provider  cholecalciferol (VITAMIN D) 1000 units tablet Take 3,000 Units by mouth daily.   Yes [provider]  IRON, FERROUS GLUCONATE, PO Take 1 tablet by mouth daily.    Yes [provider]  vitamin B-12 (CYANOCOBALAMIN) 1000 MCG tablet Take 1,000 mcg by mouth daily.   Yes [provider]    Family History Family History  Problem Relation Age of Onset  . Colon cancer Father   . Neurofibromatosis Neg Hx     Social History Social History  Substance Use Topics  . Smoking status: Never Smoker  . Smokeless tobacco: Never Used  . Alcohol use 0.0 oz/week     Comment: Rare     Allergies   Patient has no known allergies.   Review of Systems Review of Systems  Constitutional: Negative for fever.  Respiratory: Negative for cough and shortness of breath.   Cardiovascular: Positive for chest pain. Negative for syncope.  Gastrointestinal: Negative for abdominal pain, diarrhea and vomiting.  Neurological: Positive for headaches. Negative for speech difficulty and weakness.  All other systems reviewed and are negative.    Physical Exam Updated Vital Signs BP 138/88 (BP Location: Left Arm)  Pulse 69   Temp 98.8 F (37.1 C) (Oral)   Resp 16   Ht 1.676 m (5\' 6" )   Wt 90.3 kg (199 lb)   SpO2 99%   BMI 32.12 kg/m   Physical Exam CONSTITUTIONAL: Well developed/well nourished HEAD: Normocephalic/atraumatic EYES: EOMI/PERRL ENMT: Mucous membranes moist NECK: supple no meningeal signs SPINE/BACK:entire spine nontender CV: S1/S2 noted, no murmurs/rubs/gallops noted LUNGS: Lungs are clear to auscultation bilaterally, no apparent distress ABDOMEN: soft, nontender, no rebound or guarding, bowel sounds noted throughout abdomen GU:no cva tenderness NEURO: Pt is awake/alert/appropriate, moves all extremitiesx4.  No facial droop.  No  arm/leg drift noted.   EXTREMITIES: pulses normal/equal, full ROM SKIN: warm, color normal PSYCH: no abnormalities of mood noted, alert and oriented to situation   ED Treatments / Results  Labs (all labs ordered are listed, but only abnormal results are displayed) Labs Reviewed  BASIC METABOLIC PANEL - Abnormal; Notable for the following:       Result Value   Glucose, Bld 104 (*)    All other components within normal limits  CBC  I-STAT TROPONIN, ED  I-STAT TROPONIN, ED    EKG  EKG Interpretation  Date/Time:  Friday June 01 2017 02:43:50 EDT Ventricular Rate:  73 PR Interval:  146 QRS Duration: 82 QT Interval:  372 QTC Calculation: 409 R Axis:   57 Text Interpretation:  Normal sinus rhythm T wave abnormality, consider inferior ischemia T wave abnormality, consider anterior ischemia Abnormal ECG No significant change since last tracing Confirmed by Zadie RhineWickline, Espen Bethel ([id number]) on 06/01/2017 2:55:04 AM       Radiology Dg Chest 2 View  Result Date: 06/01/2017 CLINICAL DATA:  Chest pain with bilateral upper and lower extremity tightness. Nausea and dizziness. EXAM: CHEST  2 VIEW COMPARISON:  09/01/2015 FINDINGS: The heart size and mediastinal contours are within normal limits. Both lungs are clear. The visualized skeletal structures are unremarkable. IMPRESSION: No active cardiopulmonary disease. Electronically Signed   By: Burman NievesWilliam  Stevens M.D.   On: 06/01/2017 04:09    Procedures Procedures  Medications Ordered in ED Medications - No data to display   Initial Impression / Assessment and Plan / ED Course  I have reviewed the triage vital signs and the nursing notes.  Pertinent labs & imaging results that were available during my care of the patient were reviewed by me and considered in my medical decision making (see chart for details).     4:37 AM Pt stable Declines medications at this time EKG unchanged from prior HEART score 3 No signs of PE/Dissection No signs  of acute stroke Will monitor    After monitoring in the ED, pt improved No new complaints Repeat troponin negative Will d/c home We discussed strict ER return precautions   Final Clinical Impressions(s) / ED Diagnoses   Final diagnoses:  Precordial pain  Other headache syndrome    New Prescriptions New Prescriptions   No medications on file     Zadie RhineWickline, Pragya Lofaso, MD 06/01/17 [phone]

## 2017-06-01 NOTE — ED Notes (Signed)
EDP at bedside giving update

## 2017-06-01 NOTE — Discharge Instructions (Signed)

## 2017-06-01 NOTE — ED Notes (Signed)
Patient transported to X-ray 

## 2017-09-04 DIAGNOSIS — N390 Urinary tract infection, site not specified: Secondary | ICD-10-CM | POA: Diagnosis not present

## 2017-09-24 ENCOUNTER — Encounter: Payer: Self-pay | Admitting: Gynecology

## 2017-09-24 ENCOUNTER — Ambulatory Visit (INDEPENDENT_AMBULATORY_CARE_PROVIDER_SITE_OTHER): Payer: 59 | Admitting: Gynecology

## 2017-09-24 VITALS — BP 118/76 | Ht 64.5 in | Wt 197.0 lb

## 2017-09-24 DIAGNOSIS — N952 Postmenopausal atrophic vaginitis: Secondary | ICD-10-CM

## 2017-09-24 DIAGNOSIS — Z13220 Encounter for screening for lipoid disorders: Secondary | ICD-10-CM

## 2017-09-24 DIAGNOSIS — M810 Age-related osteoporosis without current pathological fracture: Secondary | ICD-10-CM | POA: Diagnosis not present

## 2017-09-24 DIAGNOSIS — Z01411 Encounter for gynecological examination (general) (routine) with abnormal findings: Secondary | ICD-10-CM | POA: Diagnosis not present

## 2017-09-24 NOTE — Patient Instructions (Signed)
Schedule your mammogram  Schedule your bone density  Follow-up in 1 year for annual exam.

## 2017-09-24 NOTE — Addendum Note (Signed)
Addended by: Dayna BarkerGARDNER, KIMBERLY K on: 09/24/2017 04:30 PM   Modules accepted: Orders

## 2017-09-24 NOTE — Progress Notes (Signed)
    Sabrina PeakKaren Gallagher 18-Jan-1961 [id number]        56 y.o.  [id number] for annual gynecologic exam.  Doing well without complaints  Past medical history,surgical history, problem list, medications, allergies, family history and social history were all reviewed and documented as reviewed in the EPIC chart.  ROS:  Performed with pertinent positives and negatives included in the history, assessment and plan.   Additional significant findings : None   Exam: Kennon PortelaKim Gardner assistant Vitals:   09/24/17 1603  BP: 118/76  Weight: 197 lb (89.4 kg)  Height: 5' 4.5" (1.638 m)   Body mass index is 33.29 kg/m.  General appearance:  Normal affect, orientation and appearance. Skin: Grossly normal HEENT: Without gross lesions.  No cervical or supraclavicular adenopathy. Thyroid normal.  Lungs:  Clear without wheezing, rales or rhonchi Cardiac: RR, without RMG Abdominal:  Soft, nontender, without masses, guarding, rebound, organomegaly or hernia Breasts:  Examined lying and sitting without masses, retractions, discharge or axillary adenopathy. Pelvic:  Ext, BUS, Vagina: With atrophic changes  Cervix: With mild atrophic changes.  Pap smear done  Uterus: Anteverted, normal size, shape and contour, midline and mobile nontender   Adnexa: Without masses or tenderness    Anus and perineum: Normal   Rectovaginal: Normal sphincter tone without palpated masses or tenderness.    Assessment/Plan:  56 y.o. [id number] female for annual gynecologic exam with history of premature menopause.   1. Premature menopause.  No significant hot flushes, night sweats, vaginal dryness or any vaginal bleeding.  Continue to monitor and report any issues or bleeding. 2. Osteoporosis.  DEXA 09/2015 T score -2.7.  Stable from prior DEXA.  Patient has refused treatment in the past.  Check vitamin D level now.  Schedule follow-up DEXA now a 2-year interval. 3. Mammography due now on patient agrees to schedule.  Breast exam normal today.   SBE monthly reviewed. 4. Colonoscopy 2015.  Repeat at their recommended interval. 5. Pap smear 2017 normal.  Pap smear 2016 showed ASCUS cannot rule out high-grade lesion with negative HPV.  Colposcopy and biopsy showed LGSIL with negative ECC.  Pap smear done today.  If this Pap smear is normal then plan on screening intervals per current screening guidelines with 2 annual Pap smears normal in a row. 6. Health maintenance.  Patient requests baseline labs.  CBC, CMP, lipid profile, TSH, vitamin D and urine analysis ordered.  Follow-up for bone density and mammography.  Follow-up in 1 year for annual exam.   Dara Lordsimothy P Mailen Newborn MD, 4:23 PM 09/24/2017

## 2017-09-25 ENCOUNTER — Other Ambulatory Visit: Payer: 59

## 2017-09-25 LAB — URINALYSIS W MICROSCOPIC + REFLEX CULTURE
Bacteria, UA: NONE SEEN /HPF
Bilirubin Urine: NEGATIVE
Glucose, UA: NEGATIVE
HGB URINE DIPSTICK: NEGATIVE
HYALINE CAST: NONE SEEN /LPF
KETONES UR: NEGATIVE
Leukocyte Esterase: NEGATIVE
NITRITES URINE, INITIAL: NEGATIVE
Protein, ur: NEGATIVE
Specific Gravity, Urine: 1.023 (ref 1.001–1.03)

## 2017-09-25 LAB — NO CULTURE INDICATED

## 2017-09-26 ENCOUNTER — Other Ambulatory Visit: Payer: Self-pay | Admitting: Gynecology

## 2017-09-26 DIAGNOSIS — E7800 Pure hypercholesterolemia, unspecified: Secondary | ICD-10-CM

## 2017-09-26 LAB — CBC WITH DIFFERENTIAL/PLATELET
BASOS ABS: 62 {cells}/uL (ref 0–200)
Basophils Relative: 1.2 %
EOS PCT: 1.9 %
Eosinophils Absolute: 99 cells/uL (ref 15–500)
HEMATOCRIT: 39.6 % (ref 35.0–45.0)
Hemoglobin: 13.3 g/dL (ref 11.7–15.5)
Lymphs Abs: 2402 cells/uL (ref 850–3900)
MCH: 29.1 pg (ref 27.0–33.0)
MCHC: 33.6 g/dL (ref 32.0–36.0)
MCV: 86.7 fL (ref 80.0–100.0)
MPV: 11.5 fL (ref 7.5–12.5)
Monocytes Relative: 9.2 %
NEUTROS PCT: 41.5 %
Neutro Abs: 2158 cells/uL (ref 1500–7800)
PLATELETS: 250 10*3/uL (ref 140–400)
RBC: 4.57 10*6/uL (ref 3.80–5.10)
RDW: 13 % (ref 11.0–15.0)
TOTAL LYMPHOCYTE: 46.2 %
WBC: 5.2 10*3/uL (ref 3.8–10.8)
WBCMIX: 478 {cells}/uL (ref 200–950)

## 2017-09-26 LAB — TSH: TSH: 2.98 mIU/L

## 2017-09-26 LAB — COMPREHENSIVE METABOLIC PANEL
AG Ratio: 1.5 (calc) (ref 1.0–2.5)
ALKALINE PHOSPHATASE (APISO): 71 U/L (ref 33–130)
ALT: 20 U/L (ref 6–29)
AST: 19 U/L (ref 10–35)
Albumin: 4.3 g/dL (ref 3.6–5.1)
BILIRUBIN TOTAL: 0.5 mg/dL (ref 0.2–1.2)
BUN: 8 mg/dL (ref 7–25)
CO2: 28 mmol/L (ref 20–32)
CREATININE: 0.74 mg/dL (ref 0.50–1.05)
Calcium: 9.3 mg/dL (ref 8.6–10.4)
Chloride: 103 mmol/L (ref 98–110)
Globulin: 2.8 g/dL (calc) (ref 1.9–3.7)
Glucose, Bld: 99 mg/dL (ref 65–99)
Potassium: 4.1 mmol/L (ref 3.5–5.3)
Sodium: 139 mmol/L (ref 135–146)
Total Protein: 7.1 g/dL (ref 6.1–8.1)

## 2017-09-26 LAB — LIPID PANEL
CHOL/HDL RATIO: 3.9 (calc) (ref <5.0)
Cholesterol: 217 mg/dL — ABNORMAL HIGH (ref <200)
HDL: 56 mg/dL (ref >50)
LDL CHOLESTEROL (CALC): 134 mg/dL — AB
NON-HDL CHOLESTEROL (CALC): 161 mg/dL — AB (ref <130)
Triglycerides: 145 mg/dL (ref <150)

## 2017-09-26 LAB — VITAMIN D 25 HYDROXY (VIT D DEFICIENCY, FRACTURES): Vit D, 25-Hydroxy: 49 ng/mL (ref 30–100)

## 2017-09-27 LAB — PAP IG W/ RFLX HPV ASCU

## 2017-10-26 ENCOUNTER — Other Ambulatory Visit: Payer: Self-pay | Admitting: Gynecology

## 2017-10-26 DIAGNOSIS — Z1231 Encounter for screening mammogram for malignant neoplasm of breast: Secondary | ICD-10-CM

## 2017-11-19 ENCOUNTER — Ambulatory Visit
Admission: RE | Admit: 2017-11-19 | Discharge: 2017-11-19 | Disposition: A | Discharge status: Home / Self Care | Payer: 59 | Source: Ambulatory Visit | Attending: Gynecology | Admitting: Gynecology

## 2017-11-19 DIAGNOSIS — Z1231 Encounter for screening mammogram for malignant neoplasm of breast: Secondary | ICD-10-CM | POA: Diagnosis not present

## 2017-12-17 ENCOUNTER — Ambulatory Visit (INDEPENDENT_AMBULATORY_CARE_PROVIDER_SITE_OTHER): Payer: 59

## 2017-12-17 ENCOUNTER — Other Ambulatory Visit: Payer: Self-pay | Admitting: Gynecology

## 2017-12-17 DIAGNOSIS — M810 Age-related osteoporosis without current pathological fracture: Secondary | ICD-10-CM | POA: Diagnosis not present

## 2017-12-18 ENCOUNTER — Telehealth: Payer: Self-pay | Admitting: Gynecology

## 2017-12-18 ENCOUNTER — Encounter: Payer: Self-pay | Admitting: Gynecology

## 2017-12-18 NOTE — Telephone Encounter (Signed)
Tell patient her bone density continues to show osteoporosis.  It appears to be stable from her DEXA 2014.  We discussed treatment options in the past and she has always declined treatment.  If she is interested in re-discussing her treatment options then recommend office visit.

## 2017-12-20 NOTE — Telephone Encounter (Signed)
Left message for pt to call.

## 2017-12-21 NOTE — Telephone Encounter (Signed)
Pt informed, will call to scheduled OV.

## 2018-04-15 DIAGNOSIS — M25571 Pain in right ankle and joints of right foot: Secondary | ICD-10-CM | POA: Diagnosis not present

## 2018-08-25 DIAGNOSIS — R197 Diarrhea, unspecified: Secondary | ICD-10-CM | POA: Diagnosis not present

## 2018-09-25 ENCOUNTER — Encounter: Payer: Self-pay | Admitting: Gynecology

## 2018-09-25 ENCOUNTER — Ambulatory Visit (INDEPENDENT_AMBULATORY_CARE_PROVIDER_SITE_OTHER): Payer: 59 | Admitting: Gynecology

## 2018-09-25 VITALS — BP 120/80 | Ht 66.0 in | Wt 196.0 lb

## 2018-09-25 DIAGNOSIS — Z13220 Encounter for screening for lipoid disorders: Secondary | ICD-10-CM | POA: Diagnosis not present

## 2018-09-25 DIAGNOSIS — Z01419 Encounter for gynecological examination (general) (routine) without abnormal findings: Secondary | ICD-10-CM | POA: Diagnosis not present

## 2018-09-25 DIAGNOSIS — N952 Postmenopausal atrophic vaginitis: Secondary | ICD-10-CM

## 2018-09-25 DIAGNOSIS — M810 Age-related osteoporosis without current pathological fracture: Secondary | ICD-10-CM | POA: Diagnosis not present

## 2018-09-25 NOTE — Patient Instructions (Addendum)
Follow-up for fasting blood work. Follow-up in 1 year for annual exam. Call sooner if you want to further discuss the osteoporosis and treatment options.

## 2018-09-25 NOTE — Addendum Note (Signed)
Addended by: Dayna BarkerGARDNER, KIMBERLY K on: 09/25/2018 04:43 PM   Modules accepted: Orders

## 2018-09-25 NOTE — Progress Notes (Signed)
    Neoma LamingKaren Fernholz Dec 08, 1960 [id number]        57 y.o.  [id number] for annual gynecologic exam.  Without gynecologic complaints.  Several issues noted below.  Past medical history,surgical history, problem list, medications, allergies, family history and social history were all reviewed and documented as reviewed in the EPIC chart.  ROS:  Performed with pertinent positives and negatives included in the history, assessment and plan.   Additional significant findings : None   Exam: Kennon PortelaKim Gardner assistant Vitals:   09/25/18 1556  BP: 120/80  Weight: 196 lb (88.9 kg)  Height: 5\' 6"  (1.676 m)   Body mass index is 31.64 kg/m.  General appearance:  Normal affect, orientation and appearance. Skin: Grossly normal HEENT: Without gross lesions.  No cervical or supraclavicular adenopathy. Thyroid normal.  Lungs:  Clear without wheezing, rales or rhonchi Cardiac: RR, without RMG Abdominal:  Soft, nontender, without masses, guarding, rebound, organomegaly or hernia Breasts:  Examined lying and sitting without masses, retractions, discharge or axillary adenopathy. Pelvic:  Ext, BUS, Vagina: With atrophic changes  Cervix: With atrophic changes.  Pap smear done  Uterus: Anteverted, normal size, shape and contour, midline and mobile nontender   Adnexa: Without masses or tenderness    Anus and perineum: Normal   Rectovaginal: Normal sphincter tone without palpated masses or tenderness.    Assessment/Plan:  57 y.o. [id number] female for annual gynecologic exam.   1. History of premature menopause.  No significant symptoms or any vaginal bleeding. 2. Osteoporosis.  DEXA 2019 T score -3.0.  Slight loss in spine from prior study.  Hips normal. 3. Pap smear 2018 normal.  History of ASCUS cannot rule out high-grade lesion with negative HPV 2016.  Colposcopy with biopsy showed LGSIL with negative ECC.  Pap smear 2017 and 2018-.  Pap smear done today. 4. Colonoscopy 2015.  Repeat at their recommended  interval. 5. Mammography 10/2017.  I reminded patient she is coming due.  SBE monthly reviewed.  Breast exam normal today. 6. Health maintenance.  Future orders for fasting CBC, CMP, lipid profile, TSH and vitamin D placed.  Follow-up in 1 year, sooner as needed.   Dara Lordsimothy P Ardeth Repetto MD, 4:31 PM 09/25/2018

## 2018-09-30 LAB — PAP IG W/ RFLX HPV ASCU

## 2018-10-01 ENCOUNTER — Other Ambulatory Visit: Payer: Self-pay | Admitting: Gynecology

## 2018-10-01 MED ORDER — FLUCONAZOLE 150 MG PO TABS: 150.0000 mg | ORAL_TABLET | Freq: Once | ORAL | 0 refills | Status: AC

## 2018-10-02 ENCOUNTER — Other Ambulatory Visit: Payer: 59

## 2018-10-02 DIAGNOSIS — Z13220 Encounter for screening for lipoid disorders: Secondary | ICD-10-CM

## 2018-10-02 DIAGNOSIS — M810 Age-related osteoporosis without current pathological fracture: Secondary | ICD-10-CM | POA: Diagnosis not present

## 2018-10-02 DIAGNOSIS — Z01419 Encounter for gynecological examination (general) (routine) without abnormal findings: Secondary | ICD-10-CM | POA: Diagnosis not present

## 2018-10-03 ENCOUNTER — Encounter: Payer: Self-pay | Admitting: Gynecology

## 2018-10-03 LAB — TSH: TSH: 2.18 m[IU]/L (ref 0.40–4.50)

## 2018-10-03 LAB — CBC WITH DIFFERENTIAL/PLATELET
BASOS ABS: 49 {cells}/uL (ref 0–200)
Basophils Relative: 0.9 %
EOS ABS: 70 {cells}/uL (ref 15–500)
Eosinophils Relative: 1.3 %
HCT: 40.7 % (ref 35.0–45.0)
Hemoglobin: 13.4 g/dL (ref 11.7–15.5)
Lymphs Abs: 2495 cells/uL (ref 850–3900)
MCH: 28.5 pg (ref 27.0–33.0)
MCHC: 32.9 g/dL (ref 32.0–36.0)
MCV: 86.6 fL (ref 80.0–100.0)
MPV: 11.5 fL (ref 7.5–12.5)
Monocytes Relative: 9.2 %
NEUTROS PCT: 42.4 %
Neutro Abs: 2290 cells/uL (ref 1500–7800)
PLATELETS: 253 10*3/uL (ref 140–400)
RBC: 4.7 10*6/uL (ref 3.80–5.10)
RDW: 13.1 % (ref 11.0–15.0)
TOTAL LYMPHOCYTE: 46.2 %
WBC: 5.4 10*3/uL (ref 3.8–10.8)
WBCMIX: 497 {cells}/uL (ref 200–950)

## 2018-10-03 LAB — COMPREHENSIVE METABOLIC PANEL
AG Ratio: 1.6 (calc) (ref 1.0–2.5)
ALT: 28 U/L (ref 6–29)
AST: 25 U/L (ref 10–35)
Albumin: 4.6 g/dL (ref 3.6–5.1)
Alkaline phosphatase (APISO): 67 U/L (ref 33–130)
BUN: 15 mg/dL (ref 7–25)
CALCIUM: 9.9 mg/dL (ref 8.6–10.4)
CO2: 31 mmol/L (ref 20–32)
CREATININE: 0.85 mg/dL (ref 0.50–1.05)
Chloride: 102 mmol/L (ref 98–110)
Globulin: 2.8 g/dL (calc) (ref 1.9–3.7)
Glucose, Bld: 92 mg/dL (ref 65–99)
Potassium: 4.7 mmol/L (ref 3.5–5.3)
Sodium: 140 mmol/L (ref 135–146)
Total Bilirubin: 0.6 mg/dL (ref 0.2–1.2)
Total Protein: 7.4 g/dL (ref 6.1–8.1)

## 2018-10-03 LAB — LIPID PANEL
CHOL/HDL RATIO: 3.9 (calc) (ref <5.0)
Cholesterol: 231 mg/dL — ABNORMAL HIGH (ref <200)
HDL: 60 mg/dL (ref >50)
LDL CHOLESTEROL (CALC): 149 mg/dL — AB
Non-HDL Cholesterol (Calc): 171 mg/dL (calc) — ABNORMAL HIGH (ref <130)
Triglycerides: 108 mg/dL (ref <150)

## 2018-10-03 LAB — VITAMIN D 25 HYDROXY (VIT D DEFICIENCY, FRACTURES): VIT D 25 HYDROXY: 47 ng/mL (ref 30–100)

## 2019-07-29 ENCOUNTER — Encounter: Payer: Self-pay | Admitting: Gynecology

## 2019-09-29 ENCOUNTER — Other Ambulatory Visit: Payer: Self-pay

## 2019-09-30 ENCOUNTER — Ambulatory Visit (INDEPENDENT_AMBULATORY_CARE_PROVIDER_SITE_OTHER): Payer: 59 | Admitting: Gynecology

## 2019-09-30 ENCOUNTER — Encounter: Payer: Self-pay | Admitting: Gynecology

## 2019-09-30 VITALS — BP 120/76 | Ht 65.0 in | Wt 194.0 lb

## 2019-09-30 DIAGNOSIS — M810 Age-related osteoporosis without current pathological fracture: Secondary | ICD-10-CM

## 2019-09-30 DIAGNOSIS — N952 Postmenopausal atrophic vaginitis: Secondary | ICD-10-CM

## 2019-09-30 DIAGNOSIS — Z01419 Encounter for gynecological examination (general) (routine) without abnormal findings: Secondary | ICD-10-CM

## 2019-09-30 DIAGNOSIS — Z13220 Encounter for screening for lipoid disorders: Secondary | ICD-10-CM | POA: Diagnosis not present

## 2019-09-30 NOTE — Addendum Note (Signed)
Addended by: Nelva Nay on: 09/30/2019 04:23 PM   Modules accepted: Orders

## 2019-09-30 NOTE — Progress Notes (Signed)
    Sabrina Gallagher 1961-01-28 [id number]        58 y.o.  [id number] for annual gynecologic exam.  Without gynecologic complaints  Past medical history,surgical history, problem list, medications, allergies, family history and social history were all reviewed and documented as reviewed in the EPIC chart.  ROS:  Performed with pertinent positives and negatives included in the history, assessment and plan.   Additional significant findings : None   Exam: Caryn Bee assistant Vitals:   09/30/19 1558  BP: 120/76  Weight: 194 lb (88 kg)  Height: 5\' 5"  (1.651 m)   Body mass index is 32.28 kg/m.  General appearance:  Normal affect, orientation and appearance. Skin: Grossly normal HEENT: Without gross lesions.  No cervical or supraclavicular adenopathy. Thyroid normal.  Lungs:  Clear without wheezing, rales or rhonchi Cardiac: RR, without RMG Abdominal:  Soft, nontender, without masses, guarding, rebound, organomegaly or hernia Breasts:  Examined lying and sitting without masses, retractions, discharge or axillary adenopathy. Pelvic:  Ext, BUS, Vagina: With atrophic changes  Cervix: With atrophic changes.  Pap smear done  Uterus: Anteverted, normal size, shape and contour, midline and mobile nontender   Adnexa: Without masses or tenderness    Anus and perineum: Normal   Rectovaginal: Normal sphincter tone without palpated masses or tenderness.    Assessment/Plan:  58 y.o. H6P5916 female for annual gynecologic exam.   1. Postmenopausal.  No significant menopausal symptoms or any vaginal bleeding. 2. Osteoporosis.  DEXA 2019 T score -3.0.  Slight loss from prior study.  We discussed treatment options on multiple occasions previously.  Again discussed her increased risks of fracture and the patient is not interested in taking medication clearly understanding the risks.  Plan to repeat her DEXA next year at 2-year interval.  She will call if she changes her mind and wants to initiate  medication. 3. Mammography 10/2017.  I reminded the patient she is overdue and recommended she call and schedule a mammogram.  Breast exam normal today. 4. Colonoscopy 2013.  Repeat at their recommended interval. 5. Pap smear 2019.  Pap smear done today.  History of ASCUS cannot rule out high-grade lesion with negative HPV 2016.  Colposcopy with biopsy showed LGSIL with negative ECC.  Pap smear is 2017, 2018 and 2019 were negative.  If this Pap smear returns negative discussed decreased screening intervals. 6. Health maintenance.  Requests baseline labs.  Future orders placed for fasting CBC, CMP, lipid profile, TSH and vitamin D.  Follow-up 1 year, sooner as needed.   Anastasio Auerbach MD, 4:16 PM 09/30/2019

## 2019-09-30 NOTE — Patient Instructions (Signed)
Follow-up for fasting blood work as arranged.  Schedule your mammogram.  Call if you change your mind about taking medication for osteoporosis.  Follow-up for annual exam in 1 year.

## 2019-10-02 LAB — PAP IG W/ RFLX HPV ASCU

## 2019-10-07 ENCOUNTER — Other Ambulatory Visit: Payer: Self-pay

## 2019-10-07 ENCOUNTER — Other Ambulatory Visit: Payer: 59

## 2019-10-07 DIAGNOSIS — Z13220 Encounter for screening for lipoid disorders: Secondary | ICD-10-CM

## 2019-10-07 DIAGNOSIS — Z01419 Encounter for gynecological examination (general) (routine) without abnormal findings: Secondary | ICD-10-CM

## 2019-10-07 DIAGNOSIS — M810 Age-related osteoporosis without current pathological fracture: Secondary | ICD-10-CM

## 2019-10-08 ENCOUNTER — Encounter: Payer: Self-pay | Admitting: Gynecology

## 2019-10-08 LAB — LIPID PANEL
Cholesterol: 214 mg/dL — ABNORMAL HIGH (ref <200)
HDL: 58 mg/dL (ref >=50)
LDL Cholesterol (Calc): 137 mg/dL (calc) — ABNORMAL HIGH
Non-HDL Cholesterol (Calc): 156 mg/dL (calc) — ABNORMAL HIGH (ref <130)
Total CHOL/HDL Ratio: 3.7 (calc) (ref <5.0)
Triglycerides: 87 mg/dL (ref <150)

## 2019-10-08 LAB — COMPREHENSIVE METABOLIC PANEL
AG Ratio: 1.6 (calc) (ref 1.0–2.5)
ALT: 18 U/L (ref 6–29)
AST: 20 U/L (ref 10–35)
Albumin: 4.6 g/dL (ref 3.6–5.1)
Alkaline phosphatase (APISO): 70 U/L (ref 37–153)
BUN/Creatinine Ratio: 7 (calc) (ref 6–22)
BUN: 6 mg/dL — ABNORMAL LOW (ref 7–25)
CO2: 26 mmol/L (ref 20–32)
Calcium: 9.5 mg/dL (ref 8.6–10.4)
Chloride: 105 mmol/L (ref 98–110)
Creat: 0.86 mg/dL (ref 0.50–1.05)
Globulin: 2.8 g/dL (calc) (ref 1.9–3.7)
Glucose, Bld: 83 mg/dL (ref 65–99)
Potassium: 4 mmol/L (ref 3.5–5.3)
Sodium: 141 mmol/L (ref 135–146)
Total Bilirubin: 0.5 mg/dL (ref 0.2–1.2)
Total Protein: 7.4 g/dL (ref 6.1–8.1)

## 2019-10-08 LAB — CBC WITH DIFFERENTIAL/PLATELET
Absolute Monocytes: 446 cells/uL (ref 200–950)
Basophils Absolute: 58 cells/uL (ref 0–200)
Basophils Relative: 1.2 %
Eosinophils Absolute: 91 cells/uL (ref 15–500)
Eosinophils Relative: 1.9 %
HCT: 41.4 % (ref 35.0–45.0)
Hemoglobin: 13.6 g/dL (ref 11.7–15.5)
Lymphs Abs: 2491 cells/uL (ref 850–3900)
MCH: 29.4 pg (ref 27.0–33.0)
MCHC: 32.9 g/dL (ref 32.0–36.0)
MCV: 89.4 fL (ref 80.0–100.0)
MPV: 11.6 fL (ref 7.5–12.5)
Monocytes Relative: 9.3 %
Neutro Abs: 1714 cells/uL (ref 1500–7800)
Neutrophils Relative %: 35.7 %
Platelets: 258 10*3/uL (ref 140–400)
RBC: 4.63 10*6/uL (ref 3.80–5.10)
RDW: 13.1 % (ref 11.0–15.0)
Total Lymphocyte: 51.9 %
WBC: 4.8 10*3/uL (ref 3.8–10.8)

## 2019-10-08 LAB — VITAMIN D 25 HYDROXY (VIT D DEFICIENCY, FRACTURES): Vit D, 25-Hydroxy: 46 ng/mL (ref 30–100)

## 2019-10-08 LAB — TSH: TSH: 1.73 mIU/L (ref 0.40–4.50)

## 2019-12-19 ENCOUNTER — Ambulatory Visit: Payer: 59 | Attending: Internal Medicine

## 2019-12-19 DIAGNOSIS — Z23 Encounter for immunization: Secondary | ICD-10-CM | POA: Insufficient documentation

## 2019-12-19 NOTE — Progress Notes (Signed)
   Covid-19 Vaccination Clinic  Name:  Lataunya Ruud    MRN: [id number] DOB: Mar 23, 1961  12/19/2019  Ms. Lynn was observed post Covid-19 immunization for 15 minutes without incidence. She was provided with Vaccine Information Sheet and instruction to access the V-Safe system.   Ms. Decelles was instructed to call 911 with any severe reactions post vaccine: Marland Kitchen Difficulty breathing  . Swelling of your face and throat  . A fast heartbeat  . A bad rash all over your body  . Dizziness and weakness    Immunizations Administered    Name Date Dose VIS Date Route   Pfizer COVID-19 Vaccine 12/19/2019  2:21 PM 0.3 mL 10/10/2019 Intramuscular   Manufacturer: [employer], [employer]   Lot: [id number]   NDC: [address]-4

## 2020-01-13 ENCOUNTER — Ambulatory Visit: Payer: 59 | Attending: Internal Medicine

## 2020-01-13 DIAGNOSIS — Z23 Encounter for immunization: Secondary | ICD-10-CM

## 2020-01-13 NOTE — Progress Notes (Signed)
   Covid-19 Vaccination Clinic  Name:  Sabrina Gallagher    MRN: [id number] DOB: 09/01/1961  01/13/2020  Sabrina Gallagher was observed post Covid-19 immunization for 15 minutes without incident. She was provided with Vaccine Information Sheet and instruction to access the V-Safe system.   Sabrina Gallagher was instructed to call 911 with any severe reactions post vaccine: Marland Kitchen Difficulty breathing  . Swelling of face and throat  . A fast heartbeat  . A bad rash all over body  . Dizziness and weakness   Immunizations Administered    Name Date Dose VIS Date Route   Pfizer COVID-19 Vaccine 01/13/2020  8:33 AM 0.3 mL 10/10/2019 Intramuscular   Manufacturer: [employer], [employer]   Lot: [id number]   NDC: [address]-1

## 2020-02-08 ENCOUNTER — Emergency Department (HOSPITAL_COMMUNITY)
Admission: EM | Admit: 2020-02-08 | Discharge: 2020-02-08 | Disposition: A | Discharge status: Home / Self Care | Payer: 59 | Attending: Emergency Medicine | Admitting: Emergency Medicine

## 2020-02-08 ENCOUNTER — Other Ambulatory Visit: Payer: Self-pay

## 2020-02-08 ENCOUNTER — Encounter (HOSPITAL_COMMUNITY): Payer: Self-pay

## 2020-02-08 DIAGNOSIS — Z79899 Other long term (current) drug therapy: Secondary | ICD-10-CM | POA: Diagnosis not present

## 2020-02-08 DIAGNOSIS — R197 Diarrhea, unspecified: Secondary | ICD-10-CM | POA: Diagnosis not present

## 2020-02-08 DIAGNOSIS — R112 Nausea with vomiting, unspecified: Secondary | ICD-10-CM | POA: Diagnosis not present

## 2020-02-08 LAB — URINALYSIS, ROUTINE W REFLEX MICROSCOPIC
Bilirubin Urine: NEGATIVE
Glucose, UA: NEGATIVE mg/dL
Hgb urine dipstick: NEGATIVE
Ketones, ur: NEGATIVE mg/dL
Nitrite: NEGATIVE
Protein, ur: NEGATIVE mg/dL
Specific Gravity, Urine: 1.019 (ref 1.005–1.030)
pH: 5 (ref 5.0–8.0)

## 2020-02-08 LAB — COMPREHENSIVE METABOLIC PANEL
ALT: 22 U/L (ref 0–44)
AST: 20 U/L (ref 15–41)
Albumin: 4.4 g/dL (ref 3.5–5.0)
Alkaline Phosphatase: 58 U/L (ref 38–126)
Anion gap: 11 (ref 5–15)
BUN: 12 mg/dL (ref 6–20)
CO2: 24 mmol/L (ref 22–32)
Calcium: 9.5 mg/dL (ref 8.9–10.3)
Chloride: 103 mmol/L (ref 98–111)
Creatinine, Ser: 0.73 mg/dL (ref 0.44–1.00)
GFR calc Af Amer: >60 mL/min (ref >60)
GFR calc non Af Amer: >60 mL/min (ref >60)
Glucose, Bld: 95 mg/dL (ref 70–99)
Potassium: 3.9 mmol/L (ref 3.5–5.1)
Sodium: 138 mmol/L (ref 135–145)
Total Bilirubin: 0.6 mg/dL (ref 0.3–1.2)
Total Protein: 8.1 g/dL (ref 6.5–8.1)

## 2020-02-08 LAB — CBC
HCT: 42.9 % (ref 36.0–46.0)
Hemoglobin: 13.8 g/dL (ref 12.0–15.0)
MCH: 29.2 pg (ref 26.0–34.0)
MCHC: 32.2 g/dL (ref 30.0–36.0)
MCV: 90.9 fL (ref 80.0–100.0)
Platelets: 225 10*3/uL (ref 150–400)
RBC: 4.72 MIL/uL (ref 3.87–5.11)
RDW: 13.1 % (ref 11.5–15.5)
WBC: 7.3 10*3/uL (ref 4.0–10.5)
nRBC: 0 % (ref 0.0–0.2)

## 2020-02-08 LAB — LIPASE, BLOOD: Lipase: 21 U/L (ref 11–51)

## 2020-02-08 MED ORDER — LACTATED RINGERS IV BOLUS
1000.0000 mL | Freq: Once | INTRAVENOUS | Status: AC
  Administered 2020-02-08: 17:00:00 1000 mL via INTRAVENOUS

## 2020-02-08 MED ORDER — FAMOTIDINE IN NACL 20-0.9 MG/50ML-% IV SOLN
20.0000 mg | Freq: Once | INTRAVENOUS | Status: AC
  Administered 2020-02-08: 17:00:00 20 mg via INTRAVENOUS
  Filled 2020-02-08: qty 50

## 2020-02-08 MED ORDER — ONDANSETRON HCL 4 MG/2ML IJ SOLN
4.0000 mg | Freq: Once | INTRAMUSCULAR | Status: AC
  Administered 2020-02-08: 4 mg via INTRAVENOUS
  Filled 2020-02-08: qty 2

## 2020-02-08 MED ORDER — FAMOTIDINE 20 MG PO TABS: 20.0000 mg | ORAL_TABLET | Freq: Two times a day (BID) | ORAL | 0 refills | Status: DC

## 2020-02-08 MED ORDER — SODIUM CHLORIDE 0.9% FLUSH
3.0000 mL | Freq: Once | INTRAVENOUS | Status: AC
  Administered 2020-02-08: 3 mL via INTRAVENOUS

## 2020-02-08 NOTE — Discharge Instructions (Signed)
As discussed, today's evaluation has been generally reassuring. There is some suspicion for an acute food reaction contributing to your illness. Please monitor your condition carefully and do not hesitate to return here.  Otherwise, please stay well-hydrated, and follow-up with your physician.

## 2020-02-08 NOTE — ED Triage Notes (Signed)
Pt presents with c/o diarrhea, vomiting, and headache that started yesterday. Pt believes she ate some [place] food that did not agree with her. Pt reports that she went to UC and was told to come here.

## 2020-02-08 NOTE — ED Provider Notes (Signed)
[facility]-EMERGENCY DEPT Provider Note   CSN: [id number] Arrival date & time: 02/08/20  1445     History Chief Complaint  Patient presents with  . Emesis  . Diarrhea    Sabrina Gallagher is a 59 y.o. female who presents with nausea, vomiting, diarrhea.  Patient states that she ate some bad [place] yesterday and last night started to have nausea, vomiting, and diarrhea.  The nausea and vomiting was initially bilious but has become dry heaves.  The diarrhea was initially black but now has "turned colors" and is just runny.  She went to a walk-in clinic today at her primary care office and was told to come to the ED because she "did not look good" and they told her she may needs fluids.  She reports associated fever.  She denies chest pain, shortness of breath, cough, urinary symptoms.  She has some epigastric abdominal pain which is attributing to persistent vomiting.  She feels like her upper abdomen and esophagus are burning.  She denies prior abdominal surgeries.  HPI     Past Medical History:  Diagnosis Date  . Abnormal EKG   . Fatigue   . Osteoporosis 11/2017   T score -3.0  . Pap smear of cervix with ASCUS, cannot exclude HGSIL 08/2015   colposcopy biopsy 12:00 with LGSIL negative ECC  . Premature ovarian failure age 66  . SOB (shortness of breath)     Patient Active Problem List   Diagnosis Date Noted  . Paresthesias 04/24/2016  . Muscle pain 04/24/2016  . Small fiber neuropathy 01/31/2016  . B12 deficiency 01/31/2016  . Pre-diabetes 01/31/2016  . Reflux   . Premature ovarian failure   . OVARIAN FAILURE, PREMATURE 06/23/2010  . HYPERLIPIDEMIA-MIXED 06/23/2010  . WEIGHT GAIN 06/23/2010  . HEARTBURN 06/23/2010    Past Surgical History:  Procedure Laterality Date  . No surgical history       OB History    Gravida  3   Para  2   Term  2   Preterm      AB  1   Living  2     SAB      TAB      Ectopic      Multiple      Live  Births              Family History  Problem Relation Age of Onset  . Colon cancer Father   . Cancer Mother        Oral-tongue  . Neurofibromatosis Neg Hx     Social History   Tobacco Use  . Smoking status: Never Smoker  . Smokeless tobacco: Never Used  Substance Use Topics  . Alcohol use: Yes    Alcohol/week: 0.0 standard drinks    Comment: Rare  . Drug use: No    Home Medications Prior to Admission medications   Medication Sig Start Date End Date Taking? Authorizing Provider  cholecalciferol (VITAMIN D) 1000 units tablet Take 3,000 Units by mouth daily.   Yes [provider]  IRON, FERROUS GLUCONATE, PO Take 1 tablet by mouth daily.    Yes [provider]  vitamin B-12 (CYANOCOBALAMIN) 1000 MCG tablet Take 1,000 mcg by mouth daily.   Yes [provider]    Allergies    Patient has no known allergies.  Review of Systems   Review of Systems  Constitutional: Positive for fatigue and fever.  Gastrointestinal: Positive for abdominal pain, diarrhea, nausea  and vomiting.  Genitourinary: Negative for dysuria.  All other systems reviewed and are negative.   Physical Exam Updated Vital Signs BP 139/86 (BP Location: Left Arm)   Pulse 95   Temp 100.3 F (37.9 C) (Oral)   Resp 16   SpO2 97%   Physical Exam Vitals and nursing note reviewed.  Constitutional:      General: She is not in acute distress.    Appearance: She is well-developed. She is obese. She is not ill-appearing.     Comments: Calm, cooperative. Fatigued appearing  HENT:     Head: Normocephalic and atraumatic.  Eyes:     General: No scleral icterus.       Right eye: No discharge.        Left eye: No discharge.     Conjunctiva/sclera: Conjunctivae normal.     Pupils: Pupils are equal, round, and reactive to light.  Cardiovascular:     Rate and Rhythm: Normal rate and regular rhythm.  Pulmonary:     Effort: Pulmonary effort is normal. No respiratory distress.      Breath sounds: Normal breath sounds.  Abdominal:     General: There is no distension.     Palpations: Abdomen is soft.     Tenderness: There is no abdominal tenderness.  Musculoskeletal:     Cervical back: Normal range of motion.  Skin:    General: Skin is warm and dry.  Neurological:     Mental Status: She is alert and oriented to person, place, and time.  Psychiatric:        Behavior: Behavior normal. Behavior is cooperative.     ED Results / Procedures / Treatments   Labs (all labs ordered are listed, but only abnormal results are displayed) Labs Reviewed  URINALYSIS, ROUTINE W REFLEX MICROSCOPIC - Abnormal; Notable for the following components:      Result Value   Leukocytes,Ua TRACE (*)    Bacteria, UA RARE (*)    All other components within normal limits  LIPASE, BLOOD  COMPREHENSIVE METABOLIC PANEL  CBC    EKG None  Radiology No results found.  Procedures Procedures (including critical care time)  Medications Ordered in ED Medications  sodium chloride flush (NS) 0.9 % injection 3 mL (has no administration in time range)  lactated ringers bolus 1,000 mL (has no administration in time range)  ondansetron (ZOFRAN) injection 4 mg (has no administration in time range)  famotidine (PEPCID) IVPB 20 mg premix (has no administration in time range)    ED Course  I have reviewed the triage vital signs and the nursing notes.  Pertinent labs & imaging results that were available during my care of the patient were reviewed by me and considered in my medical decision making (see chart for details).  59 year old female presents with nausea, vomiting, diarrhea and epigastric abdominal pain after eating [place] food yesterday.  Her vital signs are reassuring other than mildly elevated temp.  Heart is regular rate and rhythm.  Lungs are clear to auscultation.  Abdomen is soft and nontender.  Will start fluids, Zofran, Pepcid.  Do not feel imaging is indicated at this time.   At shift change her labs are still pending.  Suspect patient will be able to go home after p.o. challenge.  Care signed out to Dr. Vanita Panda.  MDM Rules/Calculators/A&P                       Final Clinical Impression(s) / ED Diagnoses  Final diagnoses:  Nausea vomiting and diarrhea    Rx / DC Orders ED Discharge Orders    None       Bethel Born, PA-C 02/08/20 1752    Gerhard Munch, MD 02/08/20 [phone]

## 2020-03-02 ENCOUNTER — Other Ambulatory Visit: Payer: Self-pay | Admitting: Family Medicine

## 2020-03-02 DIAGNOSIS — Z1231 Encounter for screening mammogram for malignant neoplasm of breast: Secondary | ICD-10-CM

## 2020-03-17 ENCOUNTER — Ambulatory Visit
Admission: RE | Admit: 2020-03-17 | Discharge: 2020-03-17 | Disposition: A | Discharge status: Home / Self Care | Payer: 59 | Source: Ambulatory Visit | Attending: Family Medicine | Admitting: Family Medicine

## 2020-03-17 ENCOUNTER — Other Ambulatory Visit: Payer: Self-pay

## 2020-03-17 DIAGNOSIS — Z1231 Encounter for screening mammogram for malignant neoplasm of breast: Secondary | ICD-10-CM

## 2020-08-17 IMAGING — MG DIGITAL SCREENING BILAT W/ TOMO W/ CAD
6 of 10 series · 6 of 30 positions shown · non-contrast
Comparison: Previous exam(s).

CLINICAL DATA: Screening.

EXAM:
DIGITAL SCREENING BILATERAL MAMMOGRAM WITH TOMO AND CAD

[R MLO synth-2D (1 of 2)]
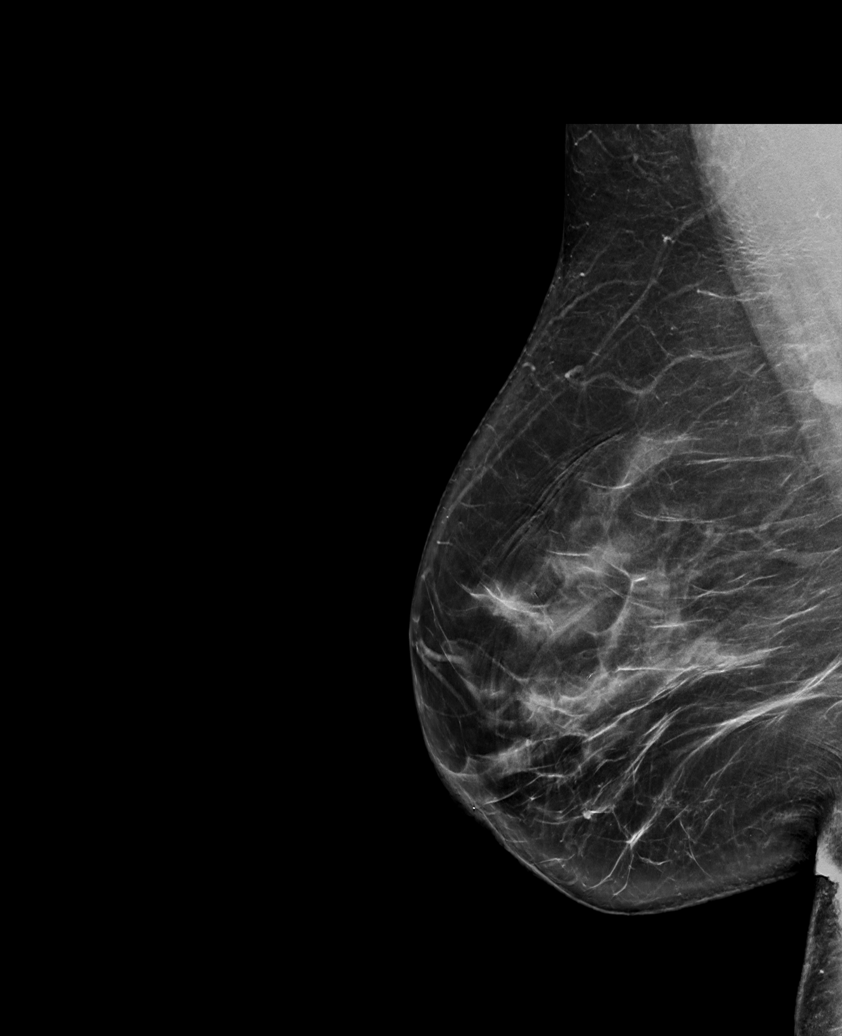

[L MLO synth-2D]
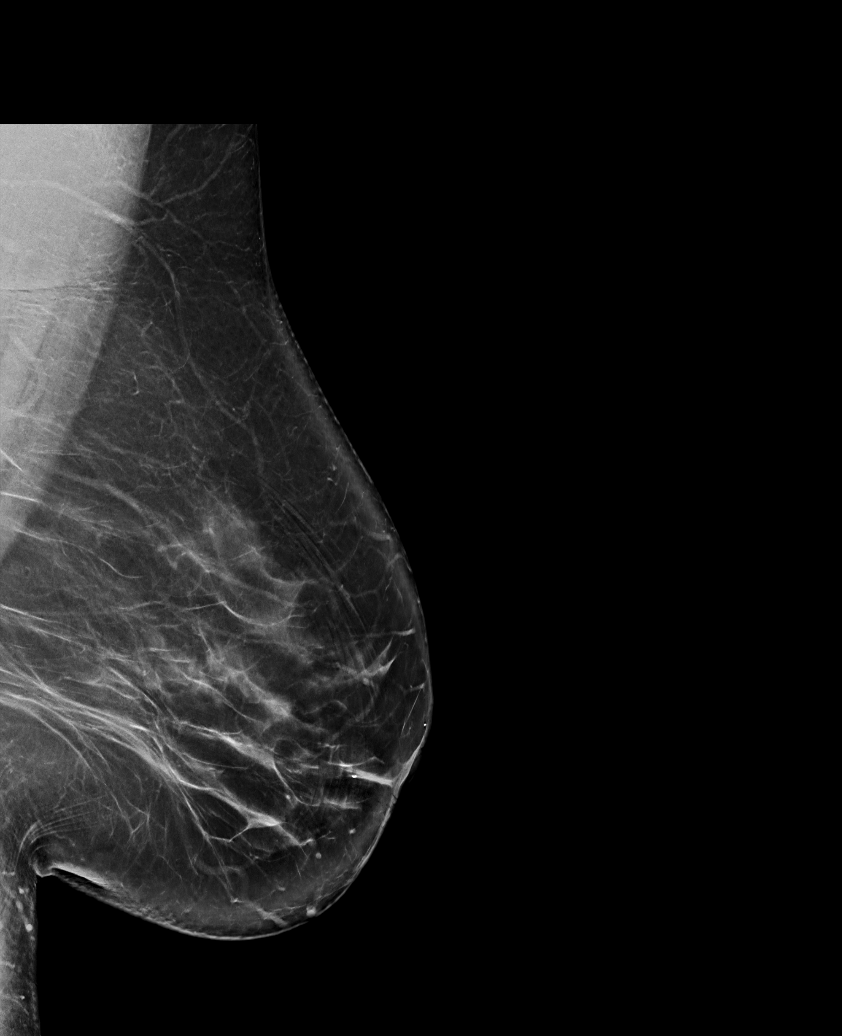

[L CC synth-2D]
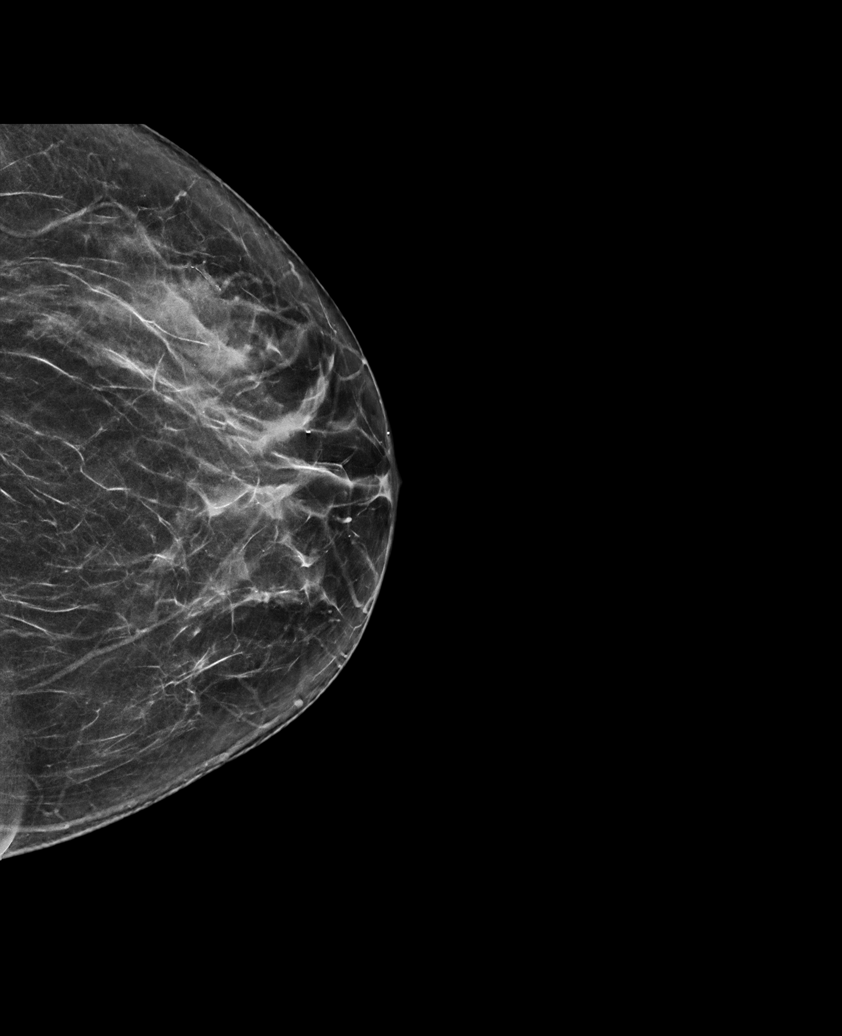

[R CC synth-2D]
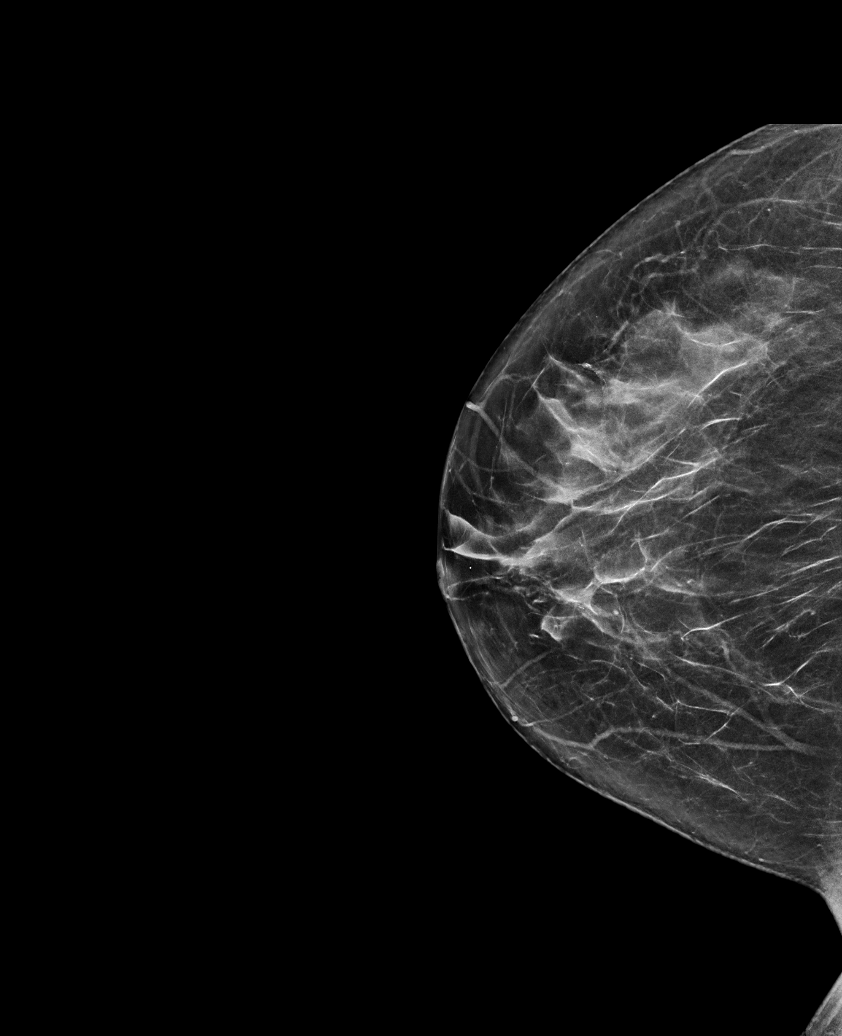

[R MLO synth-2D (2 of 2)]
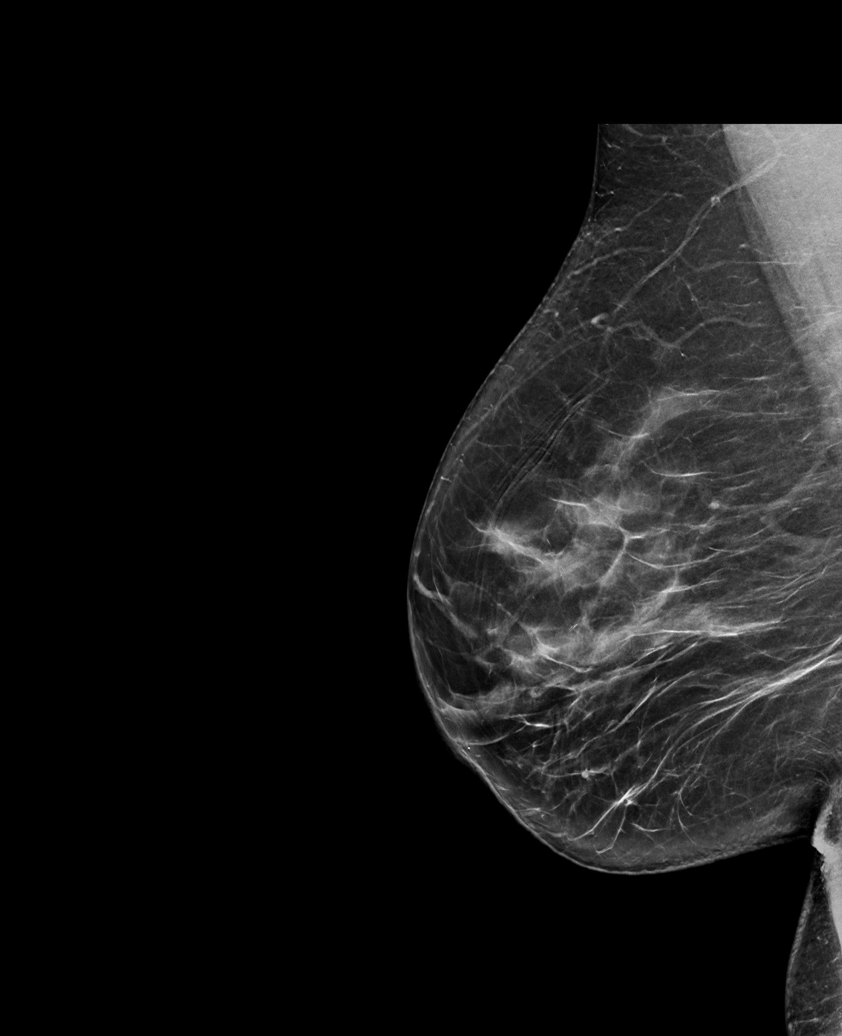

[L CC tomo · tomo slice 33/64.0]
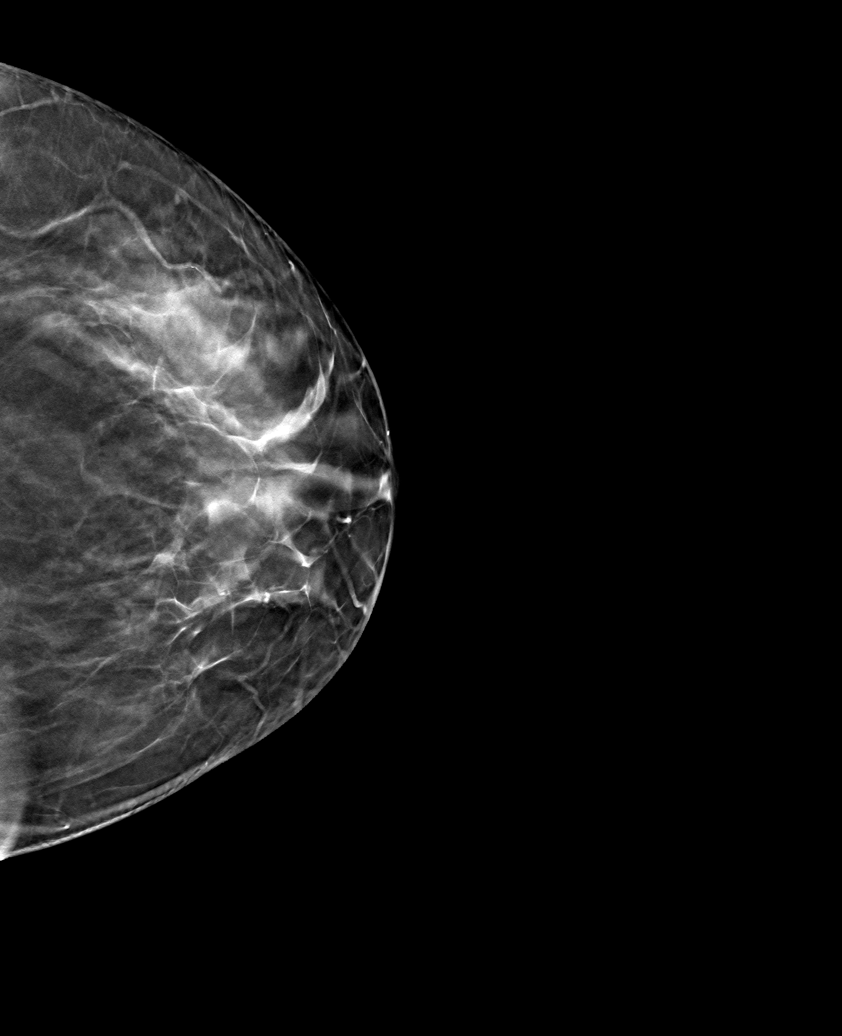

[6 of 30 positions shown; findings below may reference images not displayed]

ACR Breast Density Category b: There are scattered areas of
fibroglandular density.
FINDINGS: There are no findings suspicious for malignancy. Images were
processed with CAD.
IMPRESSION: No mammographic evidence of malignancy. A result letter of this
screening mammogram will be mailed directly to the patient.

RECOMMENDATION:
Screening mammogram in one year. (Code:[id number])

BI-RADS CATEGORY  1: Negative.

## 2020-09-06 ENCOUNTER — Emergency Department (HOSPITAL_COMMUNITY)
Admission: EM | Admit: 2020-09-06 | Discharge: 2020-09-06 | Disposition: A | Discharge status: Home / Self Care | Payer: 59 | Attending: Emergency Medicine | Admitting: Emergency Medicine

## 2020-09-06 ENCOUNTER — Encounter (HOSPITAL_COMMUNITY): Payer: Self-pay

## 2020-09-06 ENCOUNTER — Emergency Department (HOSPITAL_COMMUNITY): Payer: 59

## 2020-09-06 ENCOUNTER — Other Ambulatory Visit: Payer: Self-pay

## 2020-09-06 DIAGNOSIS — R109 Unspecified abdominal pain: Secondary | ICD-10-CM

## 2020-09-06 DIAGNOSIS — K219 Gastro-esophageal reflux disease without esophagitis: Secondary | ICD-10-CM | POA: Diagnosis not present

## 2020-09-06 DIAGNOSIS — K5900 Constipation, unspecified: Secondary | ICD-10-CM | POA: Diagnosis not present

## 2020-09-06 DIAGNOSIS — R197 Diarrhea, unspecified: Secondary | ICD-10-CM | POA: Diagnosis not present

## 2020-09-06 DIAGNOSIS — R1011 Right upper quadrant pain: Secondary | ICD-10-CM | POA: Diagnosis present

## 2020-09-06 DIAGNOSIS — Z79899 Other long term (current) drug therapy: Secondary | ICD-10-CM | POA: Insufficient documentation

## 2020-09-06 LAB — COMPREHENSIVE METABOLIC PANEL WITH GFR
ALT: 21 U/L (ref 0–44)
AST: 21 U/L (ref 15–41)
Albumin: 4.3 g/dL (ref 3.5–5.0)
Alkaline Phosphatase: 63 U/L (ref 38–126)
Anion gap: 13 (ref 5–15)
BUN: 12 mg/dL (ref 6–20)
CO2: 24 mmol/L (ref 22–32)
Calcium: 9.7 mg/dL (ref 8.9–10.3)
Chloride: 101 mmol/L (ref 98–111)
Creatinine, Ser: 0.79 mg/dL (ref 0.44–1.00)
GFR, Estimated: >60 mL/min
Glucose, Bld: 107 mg/dL — ABNORMAL HIGH (ref 70–99)
Potassium: 3.7 mmol/L (ref 3.5–5.1)
Sodium: 138 mmol/L (ref 135–145)
Total Bilirubin: 0.6 mg/dL (ref 0.3–1.2)
Total Protein: 7.4 g/dL (ref 6.5–8.1)

## 2020-09-06 LAB — CBC
HCT: 42.1 % (ref 36.0–46.0)
Hemoglobin: 13.6 g/dL (ref 12.0–15.0)
MCH: 29.8 pg (ref 26.0–34.0)
MCHC: 32.3 g/dL (ref 30.0–36.0)
MCV: 92.3 fL (ref 80.0–100.0)
Platelets: 240 K/uL (ref 150–400)
RBC: 4.56 MIL/uL (ref 3.87–5.11)
RDW: 13 % (ref 11.5–15.5)
WBC: 7.2 K/uL (ref 4.0–10.5)
nRBC: 0 % (ref 0.0–0.2)

## 2020-09-06 LAB — POC OCCULT BLOOD, ED: Fecal Occult Bld: NEGATIVE

## 2020-09-06 LAB — TYPE AND SCREEN
ABO/RH(D): O POS
Antibody Screen: NEGATIVE

## 2020-09-06 LAB — I-STAT BETA HCG BLOOD, ED (MC, WL, AP ONLY): I-stat hCG, quantitative: <5 m[IU]/mL (ref <5)

## 2020-09-06 NOTE — ED Triage Notes (Signed)
Pt reports that she has been having generalized abd pain for the past week with constipation, pt then began to have diarrhea and it was black that started today. Denies n/v

## 2020-09-06 NOTE — ED Provider Notes (Signed)
[facility] EMERGENCY DEPARTMENT Provider Note   CSN: [id number] Arrival date & time: 09/06/20  0147     History Chief Complaint  Patient presents with  . Constipation    Sabrina Gallagher is a 59 y.o. female.   Abdominal Pain Pain location:  Epigastric, RUQ and LUQ Pain quality: aching   Pain radiates to:  Does not radiate Pain severity:  Moderate Onset quality:  Gradual Timing:  Intermittent Progression:  Waxing and waning Chronicity:  New Context comment:  History of constipation Relieved by:  Nothing Worsened by:  Nothing Ineffective treatments:  None tried Associated symptoms: diarrhea (black and watery)   Associated symptoms: no chest pain, no chills, no cough, no dysuria, no fever, no nausea, no shortness of breath and no vomiting        Past Medical History:  Diagnosis Date  . Abnormal EKG   . Fatigue   . Osteoporosis 11/2017   T score -3.0  . Pap smear of cervix with ASCUS, cannot exclude HGSIL 08/2015   colposcopy biopsy 12:00 with LGSIL negative ECC  . Premature ovarian failure age 14  . SOB (shortness of breath)     Patient Active Problem List   Diagnosis Date Noted  . Paresthesias 04/24/2016  . Muscle pain 04/24/2016  . Small fiber neuropathy 01/31/2016  . B12 deficiency 01/31/2016  . Pre-diabetes 01/31/2016  . Reflux   . Premature ovarian failure   . OVARIAN FAILURE, PREMATURE 06/23/2010  . HYPERLIPIDEMIA-MIXED 06/23/2010  . WEIGHT GAIN 06/23/2010  . HEARTBURN 06/23/2010    Past Surgical History:  Procedure Laterality Date  . No surgical history       OB History    Gravida  3   Para  2   Term  2   Preterm      AB  1   Living  2     SAB      TAB      Ectopic      Multiple      Live Births              Family History  Problem Relation Age of Onset  . Colon cancer Father   . Cancer Mother        Oral-tongue  . Neurofibromatosis Neg Hx     Social History   Tobacco Use  . Smoking status:  Never Smoker  . Smokeless tobacco: Never Used  Vaping Use  . Vaping Use: Never used  Substance Use Topics  . Alcohol use: Yes    Alcohol/week: 0.0 standard drinks    Comment: Rare  . Drug use: No    Home Medications Prior to Admission medications   Medication Sig Start Date End Date Taking? Authorizing Provider  cholecalciferol (VITAMIN D) 1000 units tablet Take 3,000 Units by mouth daily.   Yes [provider]  ferrous sulfate 325 (65 FE) MG tablet Take 325 mg by mouth daily with breakfast.   Yes [provider]  vitamin B-12 (CYANOCOBALAMIN) 1000 MCG tablet Take 1,000 mcg by mouth daily.   Yes [provider]  dicyclomine (BENTYL) 20 MG tablet Take 20 mg by mouth 3 (three) times daily as needed for spasms.    [provider]  omeprazole (PRILOSEC) 20 MG capsule Take 20 mg by mouth daily.    [provider]  polyethylene glycol (MIRALAX / GLYCOLAX) 17 g packet Take 17 g by mouth daily as needed for moderate constipation.    [provider]    Allergies    Patient has no known allergies.  Review of Systems   Review of Systems  Constitutional: Negative for chills and fever.  HENT: Negative for congestion and rhinorrhea.   Respiratory: Negative for cough and shortness of breath.   Cardiovascular: Negative for chest pain and palpitations.  Gastrointestinal: Positive for abdominal pain and diarrhea (black and watery). Negative for nausea and vomiting.  Genitourinary: Negative for difficulty urinating and dysuria.  Musculoskeletal: Negative for arthralgias and back pain.  Skin: Negative for rash and wound.  Neurological: Negative for light-headedness and headaches.    Physical Exam Updated Vital Signs BP 132/84   Pulse 62   Temp 98.5 F (36.9 C) (Oral)   Resp 20   Ht 5\' 5"  (1.651 m)   Wt 85.7 kg   SpO2 98%   BMI 31.45 kg/m   Physical Exam Vitals and nursing note reviewed. Exam conducted with a chaperone present.    Constitutional:      General: She is not in acute distress.    Appearance: Normal appearance.  HENT:     Head: Normocephalic and atraumatic.     Nose: No rhinorrhea.  Eyes:     General:        Right eye: No discharge.        Left eye: No discharge.     Conjunctiva/sclera: Conjunctivae normal.  Cardiovascular:     Rate and Rhythm: Normal rate and regular rhythm.  Pulmonary:     Effort: Pulmonary effort is normal. No respiratory distress.     Breath sounds: No stridor.  Abdominal:     General: Abdomen is flat. There is no distension.     Palpations: Abdomen is soft.     Tenderness: There is abdominal tenderness (mild diffuse). There is no guarding or rebound.     Hernia: No hernia is present.  Genitourinary:    Comments: No melena no bright red blood per rectum and no impacted stool Musculoskeletal:        General: No tenderness or signs of injury.  Skin:    General: Skin is warm and dry.  Neurological:     General: No focal deficit present.     Mental Status: She is alert. Mental status is at baseline.     Motor: No weakness.  Psychiatric:        Mood and Affect: Mood normal.        Behavior: Behavior normal.     ED Results / Procedures / Treatments   Labs (all labs ordered are listed, but only abnormal results are displayed) Labs Reviewed  COMPREHENSIVE METABOLIC PANEL - Abnormal; Notable for the following components:      Result Value   Glucose, Bld 107 (*)    All other components within normal limits  CBC  POC OCCULT BLOOD, ED  I-STAT BETA HCG BLOOD, ED ([facility], [facility], [facility] ONLY)  TYPE AND SCREEN  ABO/RH    EKG None  Radiology DG Abdomen Acute W/Chest  Result Date: 09/06/2020 CLINICAL DATA:  Abdominal pain. EXAM: DG ABDOMEN ACUTE WITH 1 VIEW CHEST COMPARISON:  None. FINDINGS: The upright chest x-ray is normal. The heart is normal in size. No acute pulmonary findings, pleural effusion or pulmonary lesions. Scattered air-filled small bowel loops but no distension.  There is scattered air and stool in the colon. No significant stool burden. No findings for obstruction or perforation. The soft tissue shadows are maintained. No worrisome calcifications. The bony structures are intact. IMPRESSION:  1. No acute cardiopulmonary findings. 2. No plain film findings for an acute abdominal process. Electronically Signed   By: Rudie Meyer M.D.   On: 09/06/2020 06:00    Procedures Procedures (including critical care time)  Medications Ordered in ED Medications - No data to display  ED Course  I have reviewed the triage vital signs and the nursing notes.  Pertinent labs & imaging results that were available during my care of the patient were reviewed by me and considered in my medical decision making (see chart for details).    MDM Rules/Calculators/A&P                          Several days of worsening abdominal pain, history of constipation, trying to correct this with home remedy of black beans and dark leafy greens.  Has developed black watery stools.  Her primary care provider was worried about GI bleed.  She is hemodynamically stable.  She has normal hemoglobin.  Other labs after my review are unremarkable.  We will do a rectal exam to assess her risk of GI bleed and need for emergent intervention.  This may be diet related.  We also get acute abdominal series to assess for the stool burden.  Rectal exam is unremarkable.  X-ray reviewed by radiology myself shows no acute pathology, mild colonic stool burden, may have dietary changes affecting the color and consistency of her stool.  No signs of peritonitis no abnormal lab findings normal vital signs, she is safe for outpatient management of her undifferentiated abdominal pain return precautions provided  Final Clinical Impression(s) / ED Diagnoses Final diagnoses:  Undifferentiated abdominal pain  Diarrhea in adult patient    Rx / DC Orders ED Discharge Orders    None       Sabino Donovan,  MD 09/06/20 [phone]

## 2020-09-22 ENCOUNTER — Emergency Department (HOSPITAL_COMMUNITY): Payer: 59

## 2020-09-22 ENCOUNTER — Emergency Department (HOSPITAL_COMMUNITY)
Admission: EM | Admit: 2020-09-22 | Discharge: 2020-09-22 | Disposition: A | Discharge status: Home / Self Care | Payer: 59 | Attending: Emergency Medicine | Admitting: Emergency Medicine

## 2020-09-22 ENCOUNTER — Other Ambulatory Visit: Payer: Self-pay

## 2020-09-22 DIAGNOSIS — R1013 Epigastric pain: Secondary | ICD-10-CM | POA: Diagnosis not present

## 2020-09-22 DIAGNOSIS — R110 Nausea: Secondary | ICD-10-CM | POA: Insufficient documentation

## 2020-09-22 DIAGNOSIS — H81399 Other peripheral vertigo, unspecified ear: Secondary | ICD-10-CM | POA: Insufficient documentation

## 2020-09-22 DIAGNOSIS — R42 Dizziness and giddiness: Secondary | ICD-10-CM | POA: Diagnosis present

## 2020-09-22 DIAGNOSIS — K219 Gastro-esophageal reflux disease without esophagitis: Secondary | ICD-10-CM | POA: Diagnosis not present

## 2020-09-22 LAB — COMPREHENSIVE METABOLIC PANEL
ALT: 17 U/L (ref 0–44)
AST: 36 U/L (ref 15–41)
Albumin: 3.7 g/dL (ref 3.5–5.0)
Alkaline Phosphatase: 56 U/L (ref 38–126)
Anion gap: 12 (ref 5–15)
BUN: 10 mg/dL (ref 6–20)
CO2: 24 mmol/L (ref 22–32)
Calcium: 9 mg/dL (ref 8.9–10.3)
Chloride: 102 mmol/L (ref 98–111)
Creatinine, Ser: 0.79 mg/dL (ref 0.44–1.00)
GFR, Estimated: >60 mL/min (ref >60)
Glucose, Bld: 98 mg/dL (ref 70–99)
Potassium: 4.9 mmol/L (ref 3.5–5.1)
Sodium: 138 mmol/L (ref 135–145)
Total Bilirubin: 1 mg/dL (ref 0.3–1.2)
Total Protein: 6.6 g/dL (ref 6.5–8.1)

## 2020-09-22 LAB — CBC WITH DIFFERENTIAL/PLATELET
Abs Immature Granulocytes: 0.01 10*3/uL (ref 0.00–0.07)
Basophils Absolute: 0.1 10*3/uL (ref 0.0–0.1)
Basophils Relative: 1 %
Eosinophils Absolute: 0.1 10*3/uL (ref 0.0–0.5)
Eosinophils Relative: 2 %
HCT: 41 % (ref 36.0–46.0)
Hemoglobin: 12.9 g/dL (ref 12.0–15.0)
Immature Granulocytes: 0 %
Lymphocytes Relative: 45 %
Lymphs Abs: 2.6 10*3/uL (ref 0.7–4.0)
MCH: 29.3 pg (ref 26.0–34.0)
MCHC: 31.5 g/dL (ref 30.0–36.0)
MCV: 93 fL (ref 80.0–100.0)
Monocytes Absolute: 0.4 10*3/uL (ref 0.1–1.0)
Monocytes Relative: 8 %
Neutro Abs: 2.5 10*3/uL (ref 1.7–7.7)
Neutrophils Relative %: 44 %
Platelets: 248 10*3/uL (ref 150–400)
RBC: 4.41 MIL/uL (ref 3.87–5.11)
RDW: 12.8 % (ref 11.5–15.5)
WBC: 5.7 10*3/uL (ref 4.0–10.5)
nRBC: 0 % (ref 0.0–0.2)

## 2020-09-22 LAB — TROPONIN I (HIGH SENSITIVITY)
Troponin I (High Sensitivity): 3 ng/L (ref <18)
Troponin I (High Sensitivity): 3 ng/L (ref <18)

## 2020-09-22 LAB — LIPASE, BLOOD: Lipase: 27 U/L (ref 11–51)

## 2020-09-22 MED ORDER — PANTOPRAZOLE SODIUM 20 MG PO TBEC: 20.0000 mg | DELAYED_RELEASE_TABLET | Freq: Every day | ORAL | 0 refills | Status: DC

## 2020-09-22 MED ORDER — MECLIZINE HCL 25 MG PO TABS
25.0000 mg | ORAL_TABLET | Freq: Once | ORAL | Status: AC
  Administered 2020-09-22: 25 mg via ORAL
  Filled 2020-09-22: qty 1

## 2020-09-22 MED ORDER — MECLIZINE HCL 25 MG PO TABS: 25.0000 mg | ORAL_TABLET | Freq: Three times a day (TID) | ORAL | 0 refills | Status: DC | PRN

## 2020-09-22 MED ORDER — ACETAMINOPHEN 325 MG PO TABS
650.0000 mg | ORAL_TABLET | Freq: Once | ORAL | Status: AC
  Administered 2020-09-22: 650 mg via ORAL
  Filled 2020-09-22: qty 2

## 2020-09-22 MED ORDER — FAMOTIDINE 20 MG PO TABS
20.0000 mg | ORAL_TABLET | Freq: Once | ORAL | Status: AC
  Administered 2020-09-22: 20 mg via ORAL
  Filled 2020-09-22: qty 1

## 2020-09-22 NOTE — ED Notes (Signed)
Patient transported to CT 

## 2020-09-22 NOTE — ED Triage Notes (Signed)
Pt reports woke up with HA, CP, and dizziness.  Also reports continued abd pain from 11/8, for which she was seen here.  EMS report ischemia and ST elevation to EKG.

## 2020-09-22 NOTE — ED Provider Notes (Signed)
[facility] EMERGENCY DEPARTMENT Provider Note   CSN: [id number] Arrival date & time: 09/22/20  8341     History Chief Complaint  Patient presents with   Dizziness   Headache    Sabrina Gallagher is a 59 y.o. female.  HPI   59 y/o female with a h/o osteoporosis, who presents to the ED today for eval of dizziness.  Patient states around 4:50 AM this morning she woke up and experienced dizziness.  Describes this as a room spinning sensation.  The symptoms resolved at rest but are provoked by any movement.  Symptoms are also improved when her eyes are closed.  She later developed a headache to the frontal aspect of her head.  Describes as a throbbing sensation.  Her symptoms have improved somewhat since onset.  She denies any visual changes, speech problems, unilateral numbness/weakness.  Additionally, in route she reportedly had some EKG changes so was given aspirin.  She states she developed some nausea after receiving aspirin.  She denies any chest pain but does states she has had some epigastric abdominal pain for the last 2 weeks.  She was actually seen in the ED for this she is been taking MiraLAX.  States the pain is worse whenever she eats.  She has a history of GERD but is not taking any medication for this.  She denies any associated shortness of breath, diaphoresis.  Past Medical History:  Diagnosis Date   Abnormal EKG    Fatigue    Osteoporosis 11/2017   T score -3.0   Pap smear of cervix with ASCUS, cannot exclude HGSIL 08/2015   colposcopy biopsy 12:00 with LGSIL negative ECC   Premature ovarian failure age 38   SOB (shortness of breath)     Patient Active Problem List   Diagnosis Date Noted   Paresthesias 04/24/2016   Muscle pain 04/24/2016   Small fiber neuropathy 01/31/2016   B12 deficiency 01/31/2016   Pre-diabetes 01/31/2016   Reflux    Premature ovarian failure    OVARIAN FAILURE, PREMATURE 06/23/2010   HYPERLIPIDEMIA-MIXED  06/23/2010   WEIGHT GAIN 06/23/2010   HEARTBURN 06/23/2010    Past Surgical History:  Procedure Laterality Date   No surgical history       OB History    Gravida  3   Para  2   Term  2   Preterm      AB  1   Living  2     SAB      TAB      Ectopic      Multiple      Live Births              Family History  Problem Relation Age of Onset   Colon cancer Father    Cancer Mother        Oral-tongue   Neurofibromatosis Neg Hx     Social History   Tobacco Use   Smoking status: Never Smoker   Smokeless tobacco: Never Used  Vaping Use   Vaping Use: Never used  Substance Use Topics   Alcohol use: Yes    Alcohol/week: 0.0 standard drinks    Comment: Rare   Drug use: No    Home Medications Prior to Admission medications   Medication Sig Start Date End Date Taking? Authorizing Provider  cholecalciferol (VITAMIN D) 1000 units tablet Take 3,000 Units by mouth daily.   Yes [provider]  ferrous sulfate 325 (65 FE) MG tablet  Take 325 mg by mouth daily with breakfast.   Yes [provider]  polyethylene glycol (MIRALAX / GLYCOLAX) 17 g packet Take 17 g by mouth daily as needed for moderate constipation.   Yes [provider]  vitamin B-12 (CYANOCOBALAMIN) 1000 MCG tablet Take 1,000 mcg by mouth daily.   Yes [provider]  dicyclomine (BENTYL) 20 MG tablet Take 20 mg by mouth 3 (three) times daily as needed for spasms.    [provider]  meclizine (ANTIVERT) 25 MG tablet Take 1 tablet (25 mg total) by mouth 3 (three) times daily as needed for dizziness. 09/22/20   Casmira Cramer S, PA-C  pantoprazole (PROTONIX) 20 MG tablet Take 1 tablet (20 mg total) by mouth daily for 14 days. 09/22/20 10/06/20  Temesgen Weightman S, PA-C    Allergies    Patient has no known allergies.  Review of Systems   Review of Systems  Constitutional: Negative for fever.  HENT: Negative for ear pain and sore throat.   Eyes:  Negative for visual disturbance.  Respiratory: Negative for cough and shortness of breath.   Cardiovascular: Negative for chest pain.  Gastrointestinal: Positive for abdominal pain and nausea. Negative for constipation, diarrhea and vomiting.  Genitourinary: Negative for dysuria and hematuria.  Musculoskeletal: Negative for back pain and neck pain.  Skin: Negative for rash.  Neurological: Positive for dizziness and headaches. Negative for speech difficulty, weakness, light-headedness and numbness.  All other systems reviewed and are negative.   Physical Exam Updated Vital Signs BP 100/73 (BP Location: Right Arm)    Pulse (!) 56    Temp 98.3 F (36.8 C) (Oral)    Resp 13    Ht 5\' 5"  (1.651 m)    Wt 86 kg    SpO2 100%    BMI 31.55 kg/m   Physical Exam Vitals and nursing note reviewed.  Constitutional:      General: She is not in acute distress.    Appearance: She is well-developed.  HENT:     Head: Normocephalic and atraumatic.  Eyes:     Extraocular Movements: Extraocular movements intact.     Right eye: Normal extraocular motion and no nystagmus.     Left eye: Nystagmus present. Normal extraocular motion.     Conjunctiva/sclera: Conjunctivae normal.     Pupils: Pupils are equal, round, and reactive to light.  Cardiovascular:     Rate and Rhythm: Normal rate and regular rhythm.     Heart sounds: Normal heart sounds. No murmur heard.   Pulmonary:     Effort: Pulmonary effort is normal. No respiratory distress.     Breath sounds: Normal breath sounds. No wheezing, rhonchi or rales.  Abdominal:     General: Bowel sounds are normal.     Palpations: Abdomen is soft.     Tenderness: There is abdominal tenderness (epigastric).  Musculoskeletal:     Cervical back: Neck supple.  Skin:    General: Skin is warm and dry.  Neurological:     Mental Status: She is alert.     Comments: Mental Status:  Alert, thought content appropriate, able to give a coherent history. Speech fluent  without evidence of aphasia. Able to follow 2 step commands without difficulty.  Cranial Nerves:  II: pupils equal, round, reactive to light III,IV, VI: ptosis not present, extra-ocular motions intact bilaterally  V,VII: smile symmetric, facial light touch sensation equal VIII: hearing grossly normal to voice  X: uvula elevates symmetrically  XI: bilateral shoulder  shrug symmetric and strong XII: midline tongue extension without fassiculations Motor:  Normal tone. 5/5 strength of BUE and BLE major muscle groups including strong and equal grip strength and dorsiflexion/plantar flexion Sensory: light touch normal in all extremities. Cerebellar: normal finger-to-nose with bilateral upper extremities, normal heel to shin Gait: normal gait and balance.     ED Results / Procedures / Treatments   Labs (all labs ordered are listed, but only abnormal results are displayed) Labs Reviewed  CBC WITH DIFFERENTIAL/PLATELET  COMPREHENSIVE METABOLIC PANEL  LIPASE, BLOOD  TROPONIN I (HIGH SENSITIVITY)  TROPONIN I (HIGH SENSITIVITY)    EKG EKG Interpretation  Date/Time:  Wednesday September 22 2020 06:35:04 EST Ventricular Rate:  60 PR Interval:    QRS Duration: 86 QT Interval:  396 QTC Calculation: 396 R Axis:   53 Text Interpretation: Sinus rhythm Posterior infarct, old imrpoved inferior TW changes from prior. Confirmed by Drema Pry [phone]) on 09/22/2020 6:45:42 AM   Radiology CT Head Wo Contrast  Result Date: 09/22/2020 CLINICAL DATA:  Dizziness, nonspecific; dizziness, headache. EXAM: CT HEAD WITHOUT CONTRAST TECHNIQUE: Contiguous axial images were obtained from the base of the skull through the vertex without intravenous contrast. COMPARISON:  No pertinent prior exams available for comparison. FINDINGS: Brain: Cerebral volume is normal for age. There is no acute intracranial hemorrhage. No demarcated cortical infarct. No extra-axial fluid collection. No evidence of intracranial  mass. No midline shift. Vascular: No hyperdense vessel. Skull: Normal. Negative for fracture or focal lesion. Sinuses/Orbits: Visualized orbits show no acute finding. Mild ethmoid sinus mucosal thickening. 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 IMPRESSION: Unremarkable non-contrast CT appearance of the brain for age. No evidence of acute intracranial abnormality. Mild ethmoid sinus mucosal thickening. Electronically Signed   By: Jackey Loge DO   On: 09/22/2020 07:31   DG Chest Portable 1 View  Result Date: 09/22/2020 CLINICAL DATA:  Epigastric pain. EXAM: PORTABLE CHEST 1 VIEW COMPARISON:  June 01, 2017 FINDINGS: The heart size and mediastinal contours are within normal limits. Both lungs are clear. No visible pleural effusions or pneumothorax. The visualized skeletal structures are unremarkable. IMPRESSION: No acute cardiopulmonary disease. Electronically Signed   By: Feliberto Harts MD   On: 09/22/2020 08:14    Procedures Procedures (including critical care time)  Medications Ordered in ED Medications  famotidine (PEPCID) tablet 20 mg (20 mg Oral Given 09/22/20 0704)  meclizine (ANTIVERT) tablet 25 mg (25 mg Oral Given 09/22/20 0704)  acetaminophen (TYLENOL) tablet 650 mg (650 mg Oral Given 09/22/20 3016)    ED Course  I have reviewed the triage vital signs and the nursing notes.  Pertinent labs & imaging results that were available during my care of the patient were reviewed by me and considered in my medical decision making (see chart for details).    MDM Rules/Calculators/A&P                          59 year old female presenting the emergency department today for evaluation of dizziness onset this morning.  Symptoms provoked by movement and improved at rest.  In route reportedly had abnormal EKG so was given ASA.  On my eval denies chest pain but complaining of epigastric abdominal pain which she has had for 2 weeks and has been evaluated for  already.  Reviewed/interpreted labs CBC, CMP, lipase, troponins are all unremarkable  EKG Sinus rhythm Posterior infarct, old imrpoved inferior TW changes from prior  Reviewed/interpreted imaging CT head -  No evidence of acute intracranial abnormality. CXR - No acute cardiopulmonary disease.  Pt given tylenol for headache, meclizine for dizziness and pepcid for her epigastric pain. On reassessment her dizziness has resolved. She was able to ambulate without difficulty in the ED and without any ataxia. Pts dizziness seems peripheral in nature. It is intermittent, provoked by movement and improved at rest. She has a leftward beating nystagmus on exam. She has no other focal findings on her neuro exam and CT head neg. Will given ENT f/u and rx for meclizine. Additionally, for her epigastric discomfort, I gave rx for protonix. her pcp is in the process of organizing referral to gi. Advised on return precautions. She and her daughter on the phone voice understanding of the plan and reasons to return. All questions answered, pt stable for discharge.   Final Clinical Impression(s) / ED Diagnoses Final diagnoses:  Peripheral vertigo, unspecified laterality    Rx / DC Orders ED Discharge Orders         Ordered    meclizine (ANTIVERT) 25 MG tablet  3 times daily PRN        09/22/20 1034    pantoprazole (PROTONIX) 20 MG tablet  Daily        09/22/20 [address]1048           Zenovia Justman S, PA-C 09/22/20 1111    Derwood KaplanNanavati, Ankit, MD 09/23/20 [phone]

## 2020-09-22 NOTE — ED Notes (Signed)
PA at bedside.

## 2020-09-22 NOTE — Discharge Instructions (Signed)
Prescription given for Meclizine. Take medication as directed and do not operate machinery, drive a car, or work while taking this medication as it can make you drowsy.   Please follow up with the ENT doctor within 5-7 days for re-evaluation of your symptoms.   Please return to the emergency department for any new or worsening symptoms.

## 2020-09-22 NOTE — ED Notes (Signed)
Pt returned from CT °

## 2020-09-22 NOTE — ED Notes (Signed)
Pt ambulated with steady gait. Denies dizziness while walking. Pt unassisted and tolerated well.

## 2020-09-30 ENCOUNTER — Ambulatory Visit (INDEPENDENT_AMBULATORY_CARE_PROVIDER_SITE_OTHER): Payer: 59 | Admitting: Obstetrics and Gynecology

## 2020-09-30 ENCOUNTER — Encounter: Payer: Self-pay | Admitting: Obstetrics and Gynecology

## 2020-09-30 ENCOUNTER — Other Ambulatory Visit: Payer: Self-pay

## 2020-09-30 VITALS — BP 118/74 | Ht 65.0 in | Wt 189.0 lb

## 2020-09-30 DIAGNOSIS — Z13220 Encounter for screening for lipoid disorders: Secondary | ICD-10-CM

## 2020-09-30 DIAGNOSIS — M810 Age-related osteoporosis without current pathological fracture: Secondary | ICD-10-CM

## 2020-09-30 DIAGNOSIS — Z01419 Encounter for gynecological examination (general) (routine) without abnormal findings: Secondary | ICD-10-CM | POA: Diagnosis not present

## 2020-09-30 NOTE — Progress Notes (Signed)
   Sabrina Gallagher [birth year] [id number]  SUBJECTIVE:  59 y.o. [id number] female for annual routine gynecologic exam. She has no gynecologic concerns.  Current Outpatient Medications  Medication Sig Dispense Refill  . cholecalciferol (VITAMIN D) 1000 units tablet Take 3,000 Units by mouth daily.    . ferrous sulfate 325 (65 FE) MG tablet Take 325 mg by mouth daily with breakfast.    . polyethylene glycol (MIRALAX / GLYCOLAX) 17 g packet Take 17 g by mouth daily as needed for moderate constipation.    . vitamin B-12 (CYANOCOBALAMIN) 1000 MCG tablet Take 1,000 mcg by mouth daily.    Marland Kitchen dicyclomine (BENTYL) 20 MG tablet Take 20 mg by mouth 3 (three) times daily as needed for spasms. (Patient not taking: Reported on 09/30/2020)    . meclizine (ANTIVERT) 25 MG tablet Take 1 tablet (25 mg total) by mouth 3 (three) times daily as needed for dizziness. (Patient not taking: Reported on 09/30/2020) 12 tablet 0  . pantoprazole (PROTONIX) 20 MG tablet Take 1 tablet (20 mg total) by mouth daily for 14 days. (Patient not taking: Reported on 09/30/2020) 14 tablet 0   No current facility-administered medications for this visit.   Allergies: Patient has no known allergies.  No LMP recorded. Patient is postmenopausal.  Past medical history,surgical history, problem list, medications, allergies, family history and social history were all reviewed and documented as reviewed in the EPIC chart.  ROS: Pertinent positives and negatives as reviewed in HPI   OBJECTIVE:  BP 118/74   Ht 5\' 5"  (1.651 m)   Wt 189 lb (85.7 kg)   BMI 31.45 kg/m  The patient appears well, alert, oriented, in no distress. ENT normal.  Neck supple. No cervical or supraclavicular adenopathy or thyromegaly.  Lungs are clear, good air entry, no wheezes, rhonchi or rales. S1 and S2 normal, no murmurs, regular rate and rhythm.  Abdomen soft without tenderness, guarding, mass or organomegaly.  Neurological is normal, no focal findings.  BREAST  EXAM: breasts appear normal, no suspicious masses, no skin or nipple changes or axillary nodes  PELVIC EXAM: VULVA: normal appearing vulva with atrophic changes, no masses, tenderness or lesions, VAGINA: normal appearing vagina with atrophic changes, normal color and discharge, no lesions, CERVIX: normal appearing cervix without discharge or lesions, UTERUS: uterus is normal size, shape, consistency and nontender, ADNEXA: normal adnexa in size, nontender and no masses  Chaperone: present during the examination  ASSESSMENT:  59 y.o. 42 here for annual gynecologic exam  PLAN:   1. Postmenopausal. No significant hot flashes or night sweats. No vaginal bleeding. 2. Pap smear 09/2019.  ASCUS Pap cannot rule out high-grade lesion with negative HPV in 2016, colposcopy with biopsy showed LGSIL, negative ECC.  Negative annual Pap smear 2017, 2018, 2019.  Next Pap smear due 2023 following the current guidelines recommending the 3 year interval. 3. Mammogram 02/2020.  Normal breast exam today.  She is reminded to schedule an annual mammogram this next year when due. 4. Colonoscopy 2013.  Recommended that she follow up at the recommended interval.   5.  Osteoporosis.  DEXA 2019.  T score -3.0.  Treatment was recommended but she decided not to do it.  Strongly encouraged.  Next DEXA recommended serous so she will plan to schedule.. 6. Health maintenance.  Recently had CBC and CMP at work-up elsewhere.  Lipid panel and vitamin D level today.  Return annually or sooner, prn.  2020 MD 09/30/20

## 2020-10-01 LAB — LIPID PANEL
Cholesterol: 230 mg/dL — ABNORMAL HIGH (ref <200)
HDL: 66 mg/dL (ref >=50)
LDL Cholesterol (Calc): 142 mg/dL (calc) — ABNORMAL HIGH
Non-HDL Cholesterol (Calc): 164 mg/dL (calc) — ABNORMAL HIGH (ref <130)
Total CHOL/HDL Ratio: 3.5 (calc) (ref <5.0)
Triglycerides: 107 mg/dL (ref <150)

## 2020-10-01 LAB — VITAMIN D 25 HYDROXY (VIT D DEFICIENCY, FRACTURES): Vit D, 25-Hydroxy: 43 ng/mL (ref 30–100)

## 2020-12-03 ENCOUNTER — Other Ambulatory Visit: Payer: Self-pay | Admitting: Physician Assistant

## 2020-12-03 DIAGNOSIS — R1013 Epigastric pain: Secondary | ICD-10-CM

## 2020-12-16 ENCOUNTER — Ambulatory Visit
Admission: RE | Admit: 2020-12-16 | Discharge: 2020-12-16 | Disposition: A | Discharge status: Home / Self Care | Payer: 59 | Source: Ambulatory Visit | Attending: Physician Assistant | Admitting: Physician Assistant

## 2020-12-16 DIAGNOSIS — R1013 Epigastric pain: Secondary | ICD-10-CM

## 2021-03-09 ENCOUNTER — Other Ambulatory Visit: Payer: Self-pay | Admitting: Obstetrics and Gynecology

## 2021-03-10 ENCOUNTER — Encounter: Payer: Self-pay | Admitting: Nurse Practitioner

## 2021-03-10 ENCOUNTER — Other Ambulatory Visit: Payer: Self-pay

## 2021-03-10 ENCOUNTER — Ambulatory Visit: Payer: 59 | Admitting: Nurse Practitioner

## 2021-03-10 VITALS — BP 124/78

## 2021-03-10 DIAGNOSIS — N644 Mastodynia: Secondary | ICD-10-CM | POA: Diagnosis not present

## 2021-03-10 NOTE — Progress Notes (Signed)
GYNECOLOGY  VISIT  CC: Right breast pain  HPI: 60 y.o. G16P2012 Married [place] American female here for breast pain  Has tenderness under right breast Was diagnosed with fatty liver in February and has pain under breast. Some doctors say the pain is GI and another told her it was her breast. Pain comes and goes. Had ultrasound of Liver and gallbladder. Does not feel lumps Last mammogram 03/17/2020 Denies nipple discharge, denies problems wit breast cancer in family history  Last annual exam with Dr. Penni Bombard 09/2020    GYNECOLOGIC HISTORY: No LMP recorded. Patient is postmenopausal. Contraception:none Menopausal hormone therapy:none  Patient Active Problem List   Diagnosis Date Noted  . Paresthesias 04/24/2016  . Muscle pain 04/24/2016  . Small fiber neuropathy 01/31/2016  . B12 deficiency 01/31/2016  . Pre-diabetes 01/31/2016  . Reflux   . Premature ovarian failure   . OVARIAN FAILURE, PREMATURE 06/23/2010  . HYPERLIPIDEMIA-MIXED 06/23/2010  . WEIGHT GAIN 06/23/2010  . HEARTBURN 06/23/2010    Past Medical History:  Diagnosis Date  . Abnormal EKG   . Fatigue   . Osteoporosis 11/2017   T score -3.0  . Pap smear of cervix with ASCUS, cannot exclude HGSIL 08/2015   colposcopy biopsy 12:00 with LGSIL negative ECC  . Premature ovarian failure age 8  . SOB (shortness of breath)     Past Surgical History:  Procedure Laterality Date  . No surgical history      MEDS:   Current Outpatient Medications on File Prior to Visit  Medication Sig Dispense Refill  . cholecalciferol (VITAMIN D) 1000 units tablet Take 3,000 Units by mouth daily.    . ferrous sulfate 325 (65 FE) MG tablet Take 325 mg by mouth daily with breakfast.    . vitamin B-12 (CYANOCOBALAMIN) 1000 MCG tablet Take 1,000 mcg by mouth daily.     No current facility-administered medications on file prior to visit.    ALLERGIES: Patient has no known allergies.  Family History  Problem Relation  Age of Onset  . Colon cancer Father   . Cancer Mother        Oral-tongue  . Neurofibromatosis Neg Hx      Review of Systems  PHYSICAL EXAMINATION:    BP 124/78     General appearance: alert, cooperative, no acute distress  Breasts: Inspection negative, No nipple retraction or dimpling, No nipple discharge or bleeding, No axillary or supraclavicular adenopathy, tenderness of right breast is located in an area where underwire may hit near midaxillary line. No palpable mass. Left breast also has mild tenderness in symmetrical area (to a lesser degree). No palpable mass               Assessment: Breast pain  Plan: Suspect breast pain maybe from under wire bra Screening mammogram is appropriate

## 2021-03-10 NOTE — Patient Instructions (Signed)

## 2021-03-14 ENCOUNTER — Telehealth: Payer: Self-pay | Admitting: *Deleted

## 2021-03-14 NOTE — Telephone Encounter (Signed)
-----   Message from Clarita Crane, NP sent at 03/10/2021 11:46 AM EDT ----- This patient tried to schedule her screening mammogram at breast center and they asked her if she had any problems and she said breast pain. They would not let her schedule until she had an exam.  I did not feel any abnormality and think a screening mammogram is appropriate. Since they will not schedule it, can you schedule it for her?  She prefers May 25 afternoon, but I told her they are probably scheduling later. She said any Wednesday after noon is fine then.  I told her someone would call her to let her know.  She goes to the breast center  Thanks Tresa Endo

## 2021-03-14 NOTE — Telephone Encounter (Signed)
I called the breast center and was told no documentation that patient needs diag. Mammogram and she can call to schedule or schedule via my chart. I called patient and informed her with this information and she will call to schedule.

## 2021-04-04 ENCOUNTER — Other Ambulatory Visit: Payer: Self-pay | Admitting: Family Medicine

## 2021-04-04 DIAGNOSIS — Z1231 Encounter for screening mammogram for malignant neoplasm of breast: Secondary | ICD-10-CM

## 2021-04-06 ENCOUNTER — Ambulatory Visit
Admission: RE | Admit: 2021-04-06 | Discharge: 2021-04-06 | Disposition: A | Discharge status: Home / Self Care | Payer: 59 | Source: Ambulatory Visit | Attending: Obstetrics and Gynecology | Admitting: Obstetrics and Gynecology

## 2021-04-06 ENCOUNTER — Other Ambulatory Visit: Payer: Self-pay

## 2021-04-06 DIAGNOSIS — Z01419 Encounter for gynecological examination (general) (routine) without abnormal findings: Secondary | ICD-10-CM

## 2021-04-06 DIAGNOSIS — M810 Age-related osteoporosis without current pathological fracture: Secondary | ICD-10-CM

## 2021-04-25 ENCOUNTER — Other Ambulatory Visit (HOSPITAL_COMMUNITY): Payer: Self-pay | Admitting: Family Medicine

## 2021-04-25 DIAGNOSIS — R1013 Epigastric pain: Secondary | ICD-10-CM

## 2021-04-25 DIAGNOSIS — R1011 Right upper quadrant pain: Secondary | ICD-10-CM

## 2021-05-06 ENCOUNTER — Ambulatory Visit (HOSPITAL_COMMUNITY): Payer: 59

## 2021-05-23 ENCOUNTER — Other Ambulatory Visit: Payer: Self-pay

## 2021-05-23 ENCOUNTER — Ambulatory Visit (HOSPITAL_COMMUNITY)
Admission: RE | Admit: 2021-05-23 | Discharge: 2021-05-23 | Disposition: A | Discharge status: Home / Self Care | Payer: 59 | Source: Ambulatory Visit | Attending: Family Medicine | Admitting: Family Medicine

## 2021-05-23 DIAGNOSIS — R1013 Epigastric pain: Secondary | ICD-10-CM | POA: Insufficient documentation

## 2021-05-23 DIAGNOSIS — R1011 Right upper quadrant pain: Secondary | ICD-10-CM | POA: Diagnosis present

## 2021-05-23 MED ORDER — TECHNETIUM TC 99M MEBROFENIN IV KIT: 5.2000 | PACK | Freq: Once | INTRAVENOUS | Status: DC | PRN

## 2021-05-30 ENCOUNTER — Ambulatory Visit
Admission: RE | Admit: 2021-05-30 | Discharge: 2021-05-30 | Disposition: A | Discharge status: Home / Self Care | Payer: 59 | Source: Ambulatory Visit

## 2021-05-30 ENCOUNTER — Other Ambulatory Visit: Payer: Self-pay

## 2021-05-30 DIAGNOSIS — Z1231 Encounter for screening mammogram for malignant neoplasm of breast: Secondary | ICD-10-CM

## 2021-07-19 ENCOUNTER — Institutional Professional Consult (permissible substitution): Payer: 59 | Admitting: Obstetrics & Gynecology

## 2021-07-20 ENCOUNTER — Other Ambulatory Visit: Payer: Self-pay

## 2021-07-20 ENCOUNTER — Encounter: Payer: Self-pay | Admitting: Obstetrics & Gynecology

## 2021-07-20 ENCOUNTER — Ambulatory Visit: Payer: 59 | Admitting: Obstetrics & Gynecology

## 2021-07-20 VITALS — BP 128/84 | HR 76 | Resp 18

## 2021-07-20 DIAGNOSIS — M810 Age-related osteoporosis without current pathological fracture: Secondary | ICD-10-CM | POA: Diagnosis not present

## 2021-07-20 NOTE — Progress Notes (Signed)
    Sabrina Gallagher 03-21-1961 [id number]        59 y.o.  [id number]   RP: Counseling and management of Osteoporosis  HPI: Early menopause at age 67.  No HRT.  BD showing Osteoporosis in 2019 here and again in 03/2021 at the [facility].  No h/o fragility fracture.  No increased risk of fall.  Taking Vit D supplements until recently.  Good Ca++ intake.  Weight bearing fitness 6 times a week.   OB History  Gravida Para Term Preterm AB Living  3 2 2   1 2   SAB IAB Ectopic Multiple Live Births               # Outcome Date GA Lbr Len/2nd Weight Sex Delivery Anes PTL Lv  3 AB           2 Term           1 Term             Past medical history,surgical history, problem list, medications, allergies, family history and social history were all reviewed and documented in the EPIC chart.   Directed ROS with pertinent positives and negatives documented in the history of present illness/assessment and plan.  Exam:  Vitals:   07/20/21 1141  BP: 128/84  Pulse: 76  Resp: 18   General appearance:  Normal  Bone Density 03/2021:  Osteoporosis with T-Score of -2.8 at the [facility].   Assessment/Plan:  60 y.o. 46   1. Postmenopausal osteoporosis  Counseling on osteoporosis with risks of fracture and medical management.  After counseling, declines Bone medical treatment after counseling.  We will take vitamin D supplements, calcium intake of 1.5 g/day total and regular weightbearing physical activities.  We will repeat a bone density June 2024.  July 2024 MD, 12:03 PM 07/20/2021

## 2021-07-27 ENCOUNTER — Encounter: Payer: Self-pay | Admitting: Obstetrics & Gynecology

## 2022-01-06 ENCOUNTER — Other Ambulatory Visit (HOSPITAL_COMMUNITY)
Admission: RE | Admit: 2022-01-06 | Discharge: 2022-01-06 | Disposition: A | Discharge status: Home / Self Care | Payer: 59 | Source: Ambulatory Visit | Attending: Obstetrics & Gynecology | Admitting: Obstetrics & Gynecology

## 2022-01-06 ENCOUNTER — Other Ambulatory Visit: Payer: Self-pay

## 2022-01-06 ENCOUNTER — Encounter: Payer: Self-pay | Admitting: Obstetrics & Gynecology

## 2022-01-06 ENCOUNTER — Ambulatory Visit (INDEPENDENT_AMBULATORY_CARE_PROVIDER_SITE_OTHER): Payer: 59 | Admitting: Obstetrics & Gynecology

## 2022-01-06 VITALS — BP 120/84 | HR 68 | Resp 16 | Ht 64.5 in | Wt 199.0 lb

## 2022-01-06 DIAGNOSIS — E66811 Obesity, class 1: Secondary | ICD-10-CM

## 2022-01-06 DIAGNOSIS — Z01419 Encounter for gynecological examination (general) (routine) without abnormal findings: Secondary | ICD-10-CM | POA: Insufficient documentation

## 2022-01-06 DIAGNOSIS — M810 Age-related osteoporosis without current pathological fracture: Secondary | ICD-10-CM

## 2022-01-06 DIAGNOSIS — Z6833 Body mass index (BMI) 33.0-33.9, adult: Secondary | ICD-10-CM

## 2022-01-06 DIAGNOSIS — R87612 Low grade squamous intraepithelial lesion on cytologic smear of cervix (LGSIL): Secondary | ICD-10-CM | POA: Diagnosis present

## 2022-01-06 DIAGNOSIS — Z780 Asymptomatic menopausal state: Secondary | ICD-10-CM | POA: Diagnosis not present

## 2022-01-06 DIAGNOSIS — E6609 Other obesity due to excess calories: Secondary | ICD-10-CM

## 2022-01-06 NOTE — Progress Notes (Signed)
? ? ?Sabrina Gallagher 05/01/1961 [id number] ? ? ?History:    61 y.o. G22P2A1L2 Married ? ?RP:  Established patient presenting for annual gyn exam  ? ?HPI: Postmenopausal, well on no HRT.  No PMB.  No pelvic pain.  No pain with IC. Pap smear 09/2019.  ASCUS Pap cannot rule out high-grade lesion with negative HPV in 2016, colposcopy with biopsy showed LGSIL, negative ECC.  Negative annual Pap smear 2017, 2018, 2019.  Pap smear today.  Breasts normal. Mammo Neg 05/2021. Colonoscopy 2013, will repeat this year.  Bone Density 03/2021:  Osteoporosis with T-Score of -2.8 at the [facility]. Declined Bone medication.  Taking Vit D supplement, Ca++ 1.5 g/d total and regular weight bearing activities.  Enjoys instrumental music and meditation.  BMI 33.63.  Health labs with Fam MD. ?  ?Past medical history,surgical history, family history and social history were all reviewed and documented in the EPIC chart. ? ?Gynecologic History ?No LMP recorded. Patient is postmenopausal. ? ?Obstetric History ?OB History  ?Gravida Para Term Preterm AB Living  ?3 2 2   1 2   ?SAB IAB Ectopic Multiple Live Births  ?           ?  ?# Outcome Date GA Lbr Len/2nd Weight Sex Delivery Anes PTL Lv  ?3 AB           ?2 Term           ?1 Term           ? ? ? ?ROS: A ROS was performed and pertinent positives and negatives are included in the history. ? GENERAL: No fevers or chills. HEENT: No change in vision, no earache, sore throat or sinus congestion. NECK: No pain or stiffness. CARDIOVASCULAR: No chest pain or pressure. No palpitations. PULMONARY: No shortness of breath, cough or wheeze. GASTROINTESTINAL: No abdominal pain, nausea, vomiting or diarrhea, melena or bright red blood per rectum. GENITOURINARY: No urinary frequency, urgency, hesitancy or dysuria. MUSCULOSKELETAL: No joint or muscle pain, no back pain, no recent trauma. DERMATOLOGIC: No rash, no itching, no lesions. ENDOCRINE: No polyuria, polydipsia, no heat or cold intolerance. No recent change in  weight. HEMATOLOGICAL: No anemia or easy bruising or bleeding. NEUROLOGIC: No headache, seizures, numbness, tingling or weakness. PSYCHIATRIC: No depression, no loss of interest in normal activity or change in sleep pattern.  ?  ? ?Exam: ? ? ?BP 120/84   Pulse 68   Resp 16   Ht 5' 4.5" (1.638 m)   Wt 199 lb (90.3 kg)   BMI 33.63 kg/m?  ? ?Body mass index is 33.63 kg/m?. ? ?General appearance : Well developed well nourished female. No acute distress ?HEENT: Eyes: no retinal hemorrhage or exudates,  Neck supple, trachea midline, no carotid bruits, no thyroidmegaly ?Lungs: Clear to auscultation, no rhonchi or wheezes, or rib retractions  ?Heart: Regular rate and rhythm, no murmurs or gallops ?Breast:Examined in sitting and supine position were symmetrical in appearance, no palpable masses or tenderness,  no skin retraction, no nipple inversion, no nipple discharge, no skin discoloration, no axillary or supraclavicular lymphadenopathy ?Abdomen: no palpable masses or tenderness, no rebound or guarding ?Extremities: no edema or skin discoloration or tenderness ? ?Pelvic: Vulva: Normal ?            Vagina: No gross lesions or discharge ? Cervix: No gross lesions or discharge.  Pap reflex done. ? Uterus  AV, normal size, shape and consistency, non-tender and mobile ? Adnexa  Without masses or tenderness ?  Anus: Normal ? ? ?Assessment/Plan:  61 y.o. female for annual exam  ? ?1. Encounter for routine gynecological examination with Papanicolaou smear of cervix ?Postmenopausal, well on no HRT.  No PMB.  No pelvic pain.  No pain with IC. Pap smear 09/2019.  ASCUS Pap cannot rule out high-grade lesion with negative HPV in 2016, colposcopy with biopsy showed LGSIL, negative ECC.  Negative annual Pap smear 2017, 2018, 2019.  Pap smear today.  Breasts normal. Mammo Neg 05/2021. Colonoscopy 2013, will repeat this year.  Bone Density 03/2021:  Osteoporosis with T-Score of -2.8 at the [facility]. Declined Bone medication.  Taking Vit  D supplement, Ca++ 1.5 g/d total and regular weight bearing activities.  Enjoys instrumental music and meditation.  BMI 33.63.  Health labs with Fam MD. ?- Cytology - PAP( [place]) ? ?2. LGSIL on Pap smear of cervix ?- Cytology - PAP( [place]) ? ?3. Postmenopause ?Postmenopausal, well on no HRT.  No PMB.  No pelvic pain.  No pain with IC. ? ?4. Postmenopausal osteoporosis ?Bone Density 03/2021:  Osteoporosis with T-Score of -2.8 at the [facility]. Declined Bone medication.  Taking Vit D supplement, Ca++ 1.5 g/d total and regular weight bearing activities. Repeat BD in 03/2023. ? ?5. Class 1 obesity due to excess calories without serious comorbidity with body mass index (BMI) of 33.0 to 33.9 in adult ?Lower calorie/carb diet.  Continue with fitness activities. ? ?Other orders ?- B Complex Vitamins (B COMPLEX PO); 1 tab ?- Ferrous Sulfate (IRON PO); 1 tab ?- VITAMIN D PO; 3,000 Int'l Units.  ? ?Genia Del MD, 4:02 PM 01/06/2022 ? ?  ?

## 2022-01-10 LAB — CYTOLOGY - PAP: Diagnosis: NEGATIVE

## 2022-05-10 ENCOUNTER — Other Ambulatory Visit: Payer: Self-pay | Admitting: Obstetrics & Gynecology

## 2022-05-10 DIAGNOSIS — Z1231 Encounter for screening mammogram for malignant neoplasm of breast: Secondary | ICD-10-CM

## 2022-06-16 ENCOUNTER — Ambulatory Visit
Admission: RE | Admit: 2022-06-16 | Discharge: 2022-06-16 | Disposition: A | Discharge status: Home / Self Care | Payer: 59 | Source: Ambulatory Visit | Attending: Obstetrics & Gynecology | Admitting: Obstetrics & Gynecology

## 2022-06-16 DIAGNOSIS — Z1231 Encounter for screening mammogram for malignant neoplasm of breast: Secondary | ICD-10-CM

## 2022-06-17 ENCOUNTER — Other Ambulatory Visit: Payer: Self-pay

## 2022-06-17 ENCOUNTER — Emergency Department (HOSPITAL_COMMUNITY): Payer: 59

## 2022-06-17 ENCOUNTER — Encounter (HOSPITAL_COMMUNITY): Payer: Self-pay | Admitting: Emergency Medicine

## 2022-06-17 ENCOUNTER — Emergency Department (HOSPITAL_COMMUNITY)
Admission: EM | Admit: 2022-06-17 | Discharge: 2022-06-17 | Disposition: A | Discharge status: Home / Self Care | Payer: 59 | Attending: Student | Admitting: Student

## 2022-06-17 DIAGNOSIS — Z20822 Contact with and (suspected) exposure to covid-19: Secondary | ICD-10-CM | POA: Diagnosis not present

## 2022-06-17 DIAGNOSIS — G43909 Migraine, unspecified, not intractable, without status migrainosus: Secondary | ICD-10-CM | POA: Diagnosis not present

## 2022-06-17 DIAGNOSIS — R29810 Facial weakness: Secondary | ICD-10-CM | POA: Insufficient documentation

## 2022-06-17 DIAGNOSIS — F419 Anxiety disorder, unspecified: Secondary | ICD-10-CM | POA: Insufficient documentation

## 2022-06-17 DIAGNOSIS — R519 Headache, unspecified: Secondary | ICD-10-CM | POA: Diagnosis not present

## 2022-06-17 DIAGNOSIS — R42 Dizziness and giddiness: Secondary | ICD-10-CM | POA: Insufficient documentation

## 2022-06-17 LAB — DIFFERENTIAL
Abs Immature Granulocytes: 0.01 10*3/uL (ref 0.00–0.07)
Basophils Absolute: 0.1 10*3/uL (ref 0.0–0.1)
Basophils Relative: 1 %
Eosinophils Absolute: 0.1 10*3/uL (ref 0.0–0.5)
Eosinophils Relative: 2 %
Immature Granulocytes: 0 %
Lymphocytes Relative: 47 %
Lymphs Abs: 3.3 10*3/uL (ref 0.7–4.0)
Monocytes Absolute: 0.5 10*3/uL (ref 0.1–1.0)
Monocytes Relative: 8 %
Neutro Abs: 2.9 10*3/uL (ref 1.7–7.7)
Neutrophils Relative %: 42 %

## 2022-06-17 LAB — I-STAT CHEM 8, ED
BUN: 16 mg/dL (ref 6–20)
Calcium, Ion: 1.15 mmol/L (ref 1.15–1.40)
Chloride: 102 mmol/L (ref 98–111)
Creatinine, Ser: 0.8 mg/dL (ref 0.44–1.00)
Glucose, Bld: 98 mg/dL (ref 70–99)
HCT: 40 % (ref 36.0–46.0)
Hemoglobin: 13.6 g/dL (ref 12.0–15.0)
Potassium: 4 mmol/L (ref 3.5–5.1)
Sodium: 140 mmol/L (ref 135–145)
TCO2: 25 mmol/L (ref 22–32)

## 2022-06-17 LAB — CBC
HCT: 40.1 % (ref 36.0–46.0)
Hemoglobin: 13.2 g/dL (ref 12.0–15.0)
MCH: 29.9 pg (ref 26.0–34.0)
MCHC: 32.9 g/dL (ref 30.0–36.0)
MCV: 90.9 fL (ref 80.0–100.0)
Platelets: 238 10*3/uL (ref 150–400)
RBC: 4.41 MIL/uL (ref 3.87–5.11)
RDW: 13.2 % (ref 11.5–15.5)
WBC: 6.9 10*3/uL (ref 4.0–10.5)
nRBC: 0 % (ref 0.0–0.2)

## 2022-06-17 LAB — COMPREHENSIVE METABOLIC PANEL
ALT: 19 U/L (ref 0–44)
AST: 21 U/L (ref 15–41)
Albumin: 4.1 g/dL (ref 3.5–5.0)
Alkaline Phosphatase: 61 U/L (ref 38–126)
Anion gap: 9 (ref 5–15)
BUN: 12 mg/dL (ref 6–20)
CO2: 26 mmol/L (ref 22–32)
Calcium: 9.6 mg/dL (ref 8.9–10.3)
Chloride: 104 mmol/L (ref 98–111)
Creatinine, Ser: 0.89 mg/dL (ref 0.44–1.00)
GFR, Estimated: >60 mL/min (ref >60)
Glucose, Bld: 104 mg/dL — ABNORMAL HIGH (ref 70–99)
Potassium: 4.1 mmol/L (ref 3.5–5.1)
Sodium: 139 mmol/L (ref 135–145)
Total Bilirubin: 0.5 mg/dL (ref 0.3–1.2)
Total Protein: 7.3 g/dL (ref 6.5–8.1)

## 2022-06-17 LAB — ETHANOL: Alcohol, Ethyl (B): <10 mg/dL (ref <10)

## 2022-06-17 LAB — APTT: aPTT: 30 seconds (ref 24–36)

## 2022-06-17 LAB — PROTIME-INR
INR: 1 (ref 0.8–1.2)
Prothrombin Time: 12.9 seconds (ref 11.4–15.2)

## 2022-06-17 LAB — I-STAT BETA HCG BLOOD, ED (MC, WL, AP ONLY): I-stat hCG, quantitative: <5 m[IU]/mL (ref <5)

## 2022-06-17 MED ORDER — DIPHENHYDRAMINE HCL 50 MG/ML IJ SOLN
25.0000 mg | Freq: Once | INTRAMUSCULAR | Status: DC
  Filled 2022-06-17: qty 1

## 2022-06-17 MED ORDER — LACTATED RINGERS IV BOLUS
1000.0000 mL | Freq: Once | INTRAVENOUS | Status: AC
  Administered 2022-06-17: 1000 mL via INTRAVENOUS

## 2022-06-17 MED ORDER — MAGNESIUM SULFATE 2 GM/50ML IV SOLN
2.0000 g | Freq: Once | INTRAVENOUS | Status: AC
Start: 2022-06-17 — End: 2022-06-17
  Administered 2022-06-17: 2 g via INTRAVENOUS
  Filled 2022-06-17: qty 50

## 2022-06-17 MED ORDER — PROCHLORPERAZINE EDISYLATE 10 MG/2ML IJ SOLN
10.0000 mg | Freq: Once | INTRAMUSCULAR | Status: DC
  Filled 2022-06-17: qty 2

## 2022-06-17 MED ORDER — MECLIZINE HCL 25 MG PO TABS: 25.0000 mg | ORAL_TABLET | Freq: Three times a day (TID) | ORAL | 0 refills | Status: DC | PRN

## 2022-06-17 NOTE — ED Notes (Signed)
Pt in CT 1 w husband, this RN, EDP. Pt expressing intense anxiety r/t scan. Advised by all staff that CT was necessary for care, especially if r/t stroke & neuro deficits. Pt tearful, requesting "someone to hold my hand." Pt's husband given lead apron, to remain w pt. Neuro & Rapid RN at bedside to assist w NIH assessment.

## 2022-06-17 NOTE — Consult Note (Signed)
Neurology Consultation Reason for Consult: Dizziness  requesting Physician: Glendora Score   CC: Headache and dizziness  History is obtained from: Patient and chart review as well as husband at bedside  HPI: Sabrina Gallagher is a 61 y.o. female with a past medical history significant for anxiety, premature ovarian failure, hyperlipidemia, obesity (BMI to be calculated), fatty liver disease, small fiber neuropathy, chronic constipation, prediabetes  She reports that she has been having some right hamstring pain for the last 3 weeks in the setting of starting a new job.  She was taking a long hot shower massaging the hamstring to try to relieve this pain.  Subsequently when she exited the shower she lost her balance and had some black spots in her vision that have been coming and going.  In triage there was some concern of possible right facial droop and left-sided weakness for which code stroke was activated  She does endorse that she infrequently have headaches, she had a similar event in 2021 but no other events subsequently.  Her headache is associated with some mild nausea, light sensitivity but no sound sensitivity, improves with rest and worsens with activity  LKW: 7:20 PM tPA given?: No, too mild to treat Premorbid modified rankin scale:      0 - No symptoms.  ROS: All other review of systems was negative except as noted in the HPI.   Past Medical History:  Diagnosis Date   Abnormal EKG    Fatigue    Osteoporosis 11/2017   T score -3.0   Pap smear of cervix with ASCUS, cannot exclude HGSIL 08/2015   colposcopy biopsy 12:00 with LGSIL negative ECC   Premature ovarian failure age 75   SOB (shortness of breath)    Past Surgical History:  Procedure Laterality Date   No surgical history     Current Outpatient Medications  Medication Instructions   B Complex Vitamins (B COMPLEX PO) 1 tab   Ferrous Sulfate (IRON PO) 1 tab   VITAMIN D PO 3,000 Int'l Units    Family History   Problem Relation Age of Onset   Colon cancer Father    Cancer Mother        Oral-tongue   Neurofibromatosis Neg Hx     Social History:  reports that she has never smoked. She has never used smokeless tobacco. She reports that she does not currently use alcohol. She reports that she does not use drugs.   Exam: Current vital signs: BP (!) 152/88   Pulse 64   Temp 98.1 F (36.7 C) (Oral)   Resp 14   SpO2 100%  Vital signs in last 24 hours: Temp:  [98.1 F (36.7 C)] 98.1 F (36.7 C) (08/19 2048) Pulse Rate:  [64-67] 64 (08/19 2204) Resp:  [14-17] 14 (08/19 2204) BP: (152-189)/(88-96) 152/88 (08/19 2204) SpO2:  [98 %-100 %] 100 % (08/19 2204)   Physical Exam  Constitutional: Appears well-developed and well-nourished.  Psych: Affect highly anxious but cooperative and redirectable Eyes: No scleral injection HENT: No oropharyngeal obstruction.  MSK: no joint deformities.  Cardiovascular: Normal rate and regular rhythm. Perfusing extremities well Respiratory: Effort normal, non-labored breathing GI: Soft.  No distension. There is no tenderness.  Skin: Warm dry and intact visible skin  Neuro: Mental Status: Patient is awake, alert, oriented to person, place, month, year, and situation. Patient is able to give a clear and coherent history. No signs of aphasia or neglect Cranial Nerves: II: Visual Fields are full. Pupils are equal,  round, and reactive to light.   III,IV, VI: EOMI without ptosis or diploplia.  No skew deviation, no nystagmus V: Facial sensation is symmetric to light touch VII: Facial movement is symmetric.  VIII: hearing is intact to voice X: Uvula elevates symmetrically XI: Shoulder shrug is symmetric. XII: tongue is midline without atrophy or fasciculations.  Motor: Tone is normal. Bulk is normal. 5/5 strength was present in all four extremities, other than the right leg which was pain limited for hip flexion and knee flexion Sensory: Sensation is  symmetric to light touch in the arms and legs. Cerebellar: FNF and HKS are intact bilaterally Gait:  Reports some dizziness but ambulates steadily with a slightly wide-based tenuous gait from scanner to chair  NIHSS total 0 Performed at 21:20 PM   I have reviewed labs in epic and the results pertinent to this consultation are:  Basic Metabolic Panel: Recent Labs  Lab 06/17/22 2057 06/17/22 28-Feb-2103  NA 139 140  K 4.1 4.0  CL 104 102  CO2 26  --   GLUCOSE 104* 98  BUN 12 16  CREATININE 0.89 0.80  CALCIUM 9.6  --     CBC: Recent Labs  Lab 06/17/22 2056-02-28 06/17/22 02-28-03  WBC 6.9  --   NEUTROABS 2.9  --   HGB 13.2 13.6  HCT 40.1 40.0  MCV 90.9  --   PLT 238  --     Coagulation Studies: Recent Labs    06/17/22 28-Feb-2056  LABPROT 12.9  INR 1.0      I have reviewed the images obtained:  Head CT personally reviewed, agree with radiology no acute intracranial process   Impression: History and examination not consistent with a stroke; HINTS exam notably did not show either skew deviation or nystagmus, and is more sensitive for small brainstem stroke than MRI especially in the first few hours of symptoms.  Patient reports she does not like to take medications and declines Compazine and Benadryl but is agreeable to IV fluids and magnesium -Migraine -Possible orthostatic hypotension or POTS  Recommendations: -1 L IV fluids -2 g magnesium IV -No other acute neurological work-up is indicated unless patient has new symptoms or complaints --if this occurs please reach out to neurology again -Medical clearance for discharge per ED  Brooke Dare MD-PhD [facility] [phone]

## 2022-06-17 NOTE — ED Provider Triage Note (Signed)
Emergency Medicine Provider Triage Evaluation Note  Sabrina Gallagher , a 61 y.o. female  was evaluated in triage.  Pt complains of dizziness. Patient has right sided facial droop and left arm numbness and weakness.  Last known well was approximately 7:30 PM today.  Review of Systems  Positive: As above Negative: As above  Physical Exam  BP (!) 189/96 (BP Location: Right Arm)   Pulse 67   Temp 98.1 F (36.7 C) (Oral)   Resp 17   SpO2 98%  Gen:   Awake, altered Resp:  Normal effort  MSK:   Moves extremities without difficulty  Other:    Medical Decision Making  Medically screening exam initiated at 8:51 PM.  Appropriate orders placed.  Sabrina Gallagher was informed that the remainder of the evaluation will be completed by another provider, this initial triage assessment does not replace that evaluation, and the importance of remaining in the ED until their evaluation is complete.  Within tPA window.  Code stroke activated.   Gareth Eagle, PA-C 06/17/22 2052

## 2022-06-17 NOTE — Code Documentation (Signed)
Responded to Code Stroke called on pt in triage for dizziness, and L arm numbness/tingling. Code Stroke called at 2058 for R facial droop and L grip weakness, LSN-1900. CBG-104, NIH-0, CT head-negative for acute changes. TNK not given-NIH-0. Plan to tx for complex migraine.

## 2022-06-17 NOTE — ED Triage Notes (Addendum)
Patient reports vertigo " room spinning" with dizziness onset this evening and left arm tingling . She adds knots at right leg veins. Patient presents with right facial droop / left grip weakness . Evaluated by PA at arrival , code stroke initiated. LSN 7:30 pm .

## 2022-06-18 LAB — RESP PANEL BY RT-PCR (FLU A&B, COVID) ARPGX2
Influenza A by PCR: NEGATIVE
Influenza B by PCR: NEGATIVE
SARS Coronavirus 2 by RT PCR: NEGATIVE

## 2022-06-18 NOTE — ED Provider Notes (Signed)
[facility] EMERGENCY DEPARTMENT Provider Note  CSN: [id number] Arrival date & time: 06/17/22 2034  Chief Complaint(s) Code Stroke  HPI Sabrina Gallagher is a 61 y.o. female who presents emergency department for evaluation of dizziness.  Patient arrives with an apparent facial droop in triage and a code stroke was activated.  Patient states that she was taking a hot shower and upon exiting the shower, she lost her balance, felt like she was going to pass out with associated vertigo.  She endorses an associated headache and arrives with significant anxiety around this event.  Denies chest pain, shortness of breath, fever, abdominal pain or other systemic symptoms.   Past Medical History Past Medical History:  Diagnosis Date   Abnormal EKG    Fatigue    Osteoporosis 11/2017   T score -3.0   Pap smear of cervix with ASCUS, cannot exclude HGSIL 08/2015   colposcopy biopsy 12:00 with LGSIL negative ECC   Premature ovarian failure age 41   SOB (shortness of breath)    Patient Active Problem List   Diagnosis Date Noted   Paresthesias 04/24/2016   Muscle pain 04/24/2016   Small fiber neuropathy 01/31/2016   B12 deficiency 01/31/2016   Pre-diabetes 01/31/2016   Reflux    Premature ovarian failure    OVARIAN FAILURE, PREMATURE 06/23/2010   HYPERLIPIDEMIA-MIXED 06/23/2010   WEIGHT GAIN 06/23/2010   HEARTBURN 06/23/2010   Home Medication(s) Prior to Admission medications   Medication Sig Start Date End Date Taking? Authorizing Provider  meclizine (ANTIVERT) 25 MG tablet Take 1 tablet (25 mg total) by mouth 3 (three) times daily as needed for dizziness. 06/17/22  Yes Nicolas Banh, MD  B Complex Vitamins (B COMPLEX PO) 1 tab    [provider]  Ferrous Sulfate (IRON PO) 1 tab    [provider]  VITAMIN D PO 3,000 Int'l Units.    [provider]                                                                                                                                     Past Surgical History Past Surgical History:  Procedure Laterality Date   No surgical history     Family History Family History  Problem Relation Age of Onset   Colon cancer Father    Cancer Mother        Oral-tongue   Neurofibromatosis Neg Hx     Social History Social History   Tobacco Use   Smoking status: Never   Smokeless tobacco: Never  Vaping Use   Vaping Use: Never used  Substance Use Topics   Alcohol use: Not Currently   Drug use: No   Allergies Patient has no known allergies.  Review of Systems Review of Systems  Neurological:  Positive for dizziness and headaches.    Physical Exam Vital Signs  I have reviewed the triage vital signs BP (!) 146/90  Pulse 67   Temp 98.1 F (36.7 C)   Resp (!) 21   SpO2 100%   Physical Exam Vitals and nursing note reviewed.  Constitutional:      General: She is not in acute distress.    Appearance: She is well-developed.  HENT:     Head: Normocephalic and atraumatic.  Eyes:     Conjunctiva/sclera: Conjunctivae normal.  Cardiovascular:     Rate and Rhythm: Normal rate and regular rhythm.     Heart sounds: No murmur heard. Pulmonary:     Effort: Pulmonary effort is normal. No respiratory distress.     Breath sounds: Normal breath sounds.  Abdominal:     Palpations: Abdomen is soft.     Tenderness: There is no abdominal tenderness.  Musculoskeletal:        General: No swelling.     Cervical back: Neck supple.  Skin:    General: Skin is warm and dry.     Capillary Refill: Capillary refill takes less than 2 seconds.  Neurological:     Mental Status: She is alert.     Cranial Nerves: No cranial nerve deficit.     Sensory: No sensory deficit.     Motor: No weakness.  Psychiatric:        Mood and Affect: Mood normal.     ED Results and Treatments Labs (all labs ordered are listed, but only abnormal results are displayed) Labs Reviewed  COMPREHENSIVE METABOLIC PANEL -  Abnormal; Notable for the following components:      Result Value   Glucose, Bld 104 (*)    All other components within normal limits  RESP PANEL BY RT-PCR (FLU A&B, COVID) ARPGX2  ETHANOL  PROTIME-INR  APTT  CBC  DIFFERENTIAL  I-STAT CHEM 8, ED  I-STAT BETA HCG BLOOD, ED ([facility], [facility], [facility] ONLY)                                                                                                                          Radiology CT HEAD CODE STROKE WO CONTRAST  Result Date: 06/17/2022 CLINICAL DATA:  Code stroke. Right facial droop, left grip weakness dizziness EXAM: CT HEAD WITHOUT CONTRAST TECHNIQUE: Contiguous axial images were obtained from the base of the skull through the vertex without intravenous contrast. RADIATION DOSE REDUCTION: This exam was performed according to the departmental dose-optimization program which includes automated exposure control, adjustment of the mA and/or kV according to patient size and/or use of iterative reconstruction technique. COMPARISON:  09/22/2020 FINDINGS: Brain: No evidence of acute infarction, hemorrhage, cerebral edema, mass, mass effect, or midline shift. No hydrocephalus or extra-axial collection. Vascular: No hyperdense vessel. Skull: Negative for fracture or focal lesion. Sinuses/Orbits: No acute finding. Other: The mastoid air cells are well aerated. ASPECTS [facility] Stroke Program Early CT Score) - Ganglionic level infarction (caudate, lentiform nuclei, internal capsule, insula, M1-M3 cortex): 7 - Supraganglionic infarction (M4-M6 cortex): 3 Total score (0-10 with 10 being normal): 10 IMPRESSION: 1.  No acute intracranial process. 2. ASPECTS is 10 Code stroke imaging results were communicated on 06/17/2022 at 9:20 pm to provider BHAGAT via secure text paging. Electronically Signed   By: Wiliam Ke M.D.   On: 06/17/2022 21:20    Pertinent labs & imaging results that were available during my care of the patient were reviewed by me and considered in my  medical decision making (see MDM for details).  Medications Ordered in ED Medications  lactated ringers bolus 1,000 mL (0 mLs Intravenous Stopped 06/17/22 2348)  magnesium sulfate IVPB 2 g 50 mL (0 g Intravenous Stopped 06/17/22 2348)                                                                                                                                     Procedures .Critical Care  Performed by: Glendora Score, MD Authorized by: Glendora Score, MD   Critical care provider statement:    Critical care time (minutes):  30   Critical care was necessary to treat or prevent imminent or life-threatening deterioration of the following conditions: Stroke alert with consideration of TNK.   Critical care was time spent personally by me on the following activities:  Development of treatment plan with patient or surrogate, discussions with consultants, evaluation of patient's response to treatment, examination of patient, ordering and review of laboratory studies, ordering and review of radiographic studies, ordering and performing treatments and interventions, pulse oximetry, re-evaluation of patient's condition and review of old charts   (including critical care time)  Medical Decision Making / ED Course   This patient presents to the ED for concern of dizziness, this involves an extensive number of treatment options, and is a complaint that carries with it a high risk of complications and morbidity.  The differential diagnosis includes BPPV, orthostatic presyncope, vasovagal presyncope, CVA, Bell's palsy  MDM: Seen emergency room for evaluation of dizziness, vertigo.  There is a possible mild facial droop on the right but suspect this is the patient's normal smile pattern and this was confirmed by patient's husband.  Stroke alert was activated by triage personnel and initial CT head was negative.  Neurology evaluated the patient at bedside and stroke alert was canceled.  Laboratory  evaluation unremarkable.  Patient was fluid resuscitated and on reevaluation her symptoms have resolved.  Suspect a multifactorial cause of her dizziness today but given patient described positional vertigo sideration's include BPPV versus orthostatic presyncope.  She was given instructions for Epley maneuvers and discharged with outpatient follow-up with strict return precautions which she voiced understanding.   Additional history obtained: -Additional history obtained from husband -External records from outside source obtained and reviewed including: Chart review including previous notes, labs, imaging, consultation notes   Lab Tests: -I ordered, reviewed, and interpreted labs.   The pertinent results include:   Labs Reviewed  COMPREHENSIVE METABOLIC PANEL - Abnormal; Notable for the following components:  Result Value   Glucose, Bld 104 (*)    All other components within normal limits  RESP PANEL BY RT-PCR (FLU A&B, COVID) ARPGX2  ETHANOL  PROTIME-INR  APTT  CBC  DIFFERENTIAL  I-STAT CHEM 8, ED  I-STAT BETA HCG BLOOD, ED ([facility], [facility], [facility] ONLY)      EKG   EKG Interpretation  Date/Time:  Saturday June 17 2022 21:34:10 EDT Ventricular Rate:  64 PR Interval:  158 QRS Duration: 80 QT Interval:  383 QTC Calculation: 396 R Axis:   63 Text Interpretation: Sinus rhythm Borderline T abnormalities, diffuse leads Confirmed by Jamyla Ard ([id number]) on 06/18/2022 6:48:48 PM         Imaging Studies ordered: I ordered imaging studies including CT head I independently visualized and interpreted imaging. I agree with the radiologist interpretation   Medicines ordered and prescription drug management: Meds ordered this encounter  Medications   DISCONTD: prochlorperazine (COMPAZINE) injection 10 mg   DISCONTD: diphenhydrAMINE (BENADRYL) injection 25 mg   lactated ringers bolus 1,000 mL   magnesium sulfate IVPB 2 g 50 mL   meclizine (ANTIVERT) 25 MG tablet    Sig: Take 1  tablet (25 mg total) by mouth 3 (three) times daily as needed for dizziness.    Dispense:  30 tablet    Refill:  0    -I have reviewed the patients home medicines and have made adjustments as needed  Critical interventions Stroke alert, consideration of TNK  Consultations Obtained: I requested consultation with the stroke neurologist,  and discussed lab and imaging findings as well as pertinent plan - they recommend: Cancellation of stroke alert, symptomatic management   Cardiac Monitoring: The patient was maintained on a cardiac monitor.  I personally viewed and interpreted the cardiac monitored which showed an underlying rhythm of: NSR  Social Determinants of Health:  Factors impacting patients care include: none   Reevaluation: After the interventions noted above, I reevaluated the patient and found that they have :improved  Co morbidities that complicate the patient evaluation  Past Medical History:  Diagnosis Date   Abnormal EKG    Fatigue    Osteoporosis 11/2017   T score -3.0   Pap smear of cervix with ASCUS, cannot exclude HGSIL 08/2015   colposcopy biopsy 12:00 with LGSIL negative ECC   Premature ovarian failure age 63   SOB (shortness of breath)       Dispostion: I considered admission for this patient, but with symptoms improved, she does not meet inpatient criteria for admission she is safe for discharge with outpatient follow-up     Final Clinical Impression(s) / ED Diagnoses Final diagnoses:  Dizziness     @PCDICTATION @    , MD 06/21/22 [phone]

## 2022-06-20 ENCOUNTER — Other Ambulatory Visit: Payer: Self-pay | Admitting: Obstetrics & Gynecology

## 2022-06-20 DIAGNOSIS — R928 Other abnormal and inconclusive findings on diagnostic imaging of breast: Secondary | ICD-10-CM

## 2022-07-17 ENCOUNTER — Ambulatory Visit
Admission: RE | Admit: 2022-07-17 | Discharge: 2022-07-17 | Disposition: A | Discharge status: Home / Self Care | Payer: 59 | Source: Ambulatory Visit | Attending: Obstetrics & Gynecology | Admitting: Obstetrics & Gynecology

## 2022-07-17 DIAGNOSIS — R928 Other abnormal and inconclusive findings on diagnostic imaging of breast: Secondary | ICD-10-CM

## 2023-01-29 ENCOUNTER — Ambulatory Visit: Admitting: Neurology

## 2023-01-29 ENCOUNTER — Encounter: Payer: Self-pay | Admitting: Neurology

## 2023-01-29 VITALS — BP 122/78 | HR 60 | Ht 66.0 in | Wt 174.0 lb

## 2023-01-29 DIAGNOSIS — M542 Cervicalgia: Secondary | ICD-10-CM

## 2023-01-29 DIAGNOSIS — R293 Abnormal posture: Secondary | ICD-10-CM

## 2023-01-29 DIAGNOSIS — M7918 Myalgia, other site: Secondary | ICD-10-CM | POA: Diagnosis not present

## 2023-01-29 DIAGNOSIS — G5603 Carpal tunnel syndrome, bilateral upper limbs: Secondary | ICD-10-CM | POA: Diagnosis not present

## 2023-01-29 NOTE — Progress Notes (Signed)
[facility]    Provider:  Dr Jaynee Eagles Referring Provider: Marda Stalker, PA-C Primary Care Physician:  Marda Stalker, PA-C  CC:  Tingling  01/29/2023:  Haven't seen in 7 years, saw for same issue in the past, still symptomatic. Mainly in the hands/palms/tingling, no inciting events, positionally worse, no triggers, better on its own. We discussed CTS at [facility], showed images online, talked about testing, conservative meaures, +mcphalen's maneuver, no weakness, still numb and tingling in the palms, mostly positional, No other focal neurologic deficits, associated symptoms, inciting events or modifiable factors. Feet resolved. Here with husband who provides much information.    COMPARISON:  No pertinent prior exams available for comparison.   FINDINGS: reviewed images and agree Brain:   Cerebral volume is normal for age.   There is no acute intracranial hemorrhage.   No demarcated cortical infarct.   No extra-axial fluid collection.   No evidence of intracranial mass.   No midline shift.   Vascular: No hyperdense vessel.   Skull: Normal. Negative for fracture or focal lesion.   Sinuses/Orbits: Visualized orbits show no acute finding. Mild ethmoid sinus mucosal thickening. 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   IMPRESSION: Unremarkable non-contrast CT appearance of the brain for age. No evidence of acute intracranial abnormality.   Mild ethmoid sinus mucosal thickening.     Electronically Signed   By: Kellie Simmering DO   On: 09/22/2020 07:31     Latest Ref Rng & Units 06/17/2022    9:04 PM 06/17/2022    8:57 PM 09/22/2020    6:53 AM  CMP  Glucose 70 - 99 mg/dL 98  104  98   BUN 6 - 20 mg/dL 16  12  10    Creatinine 0.44 - 1.00 mg/dL 0.80  0.89  0.79   Sodium 135 - 145 mmol/L 140  139  138   Potassium 3.5 - 5.1 mmol/L 4.0  4.1  4.9   Chloride 98 - 111 mmol/L 102  104  102   CO2 22 - 32 mmol/L  26  24   Calcium 8.9 -  10.3 mg/dL  9.6  9.0   Total Protein 6.5 - 8.1 g/dL  7.3  6.6   Total Bilirubin 0.3 - 1.2 mg/dL  0.5  1.0   Alkaline Phos 38 - 126 U/L  61  56   AST 15 - 41 U/L  21  36   ALT 0 - 44 U/L  19  17       Latest Ref Rng & Units 06/17/2022    9:04 PM 06/17/2022    8:57 PM 09/22/2020    6:53 AM  CBC  WBC 4.0 - 10.5 K/uL  6.9  5.7   Hemoglobin 12.0 - 15.0 g/dL 13.6  13.2  12.9   Hematocrit 36.0 - 46.0 % 40.0  40.1  41.0   Platelets 150 - 400 K/uL  238  248      Patient complains of symptoms per HPI as well as the following symptoms: none . Pertinent negatives and positives per HPI. All others negative   04/24/2016:  This is a normal study. No electrophysiologic evidence for Ulnar or Median neuropathy, radiculopathy, or neuromuscular disorder. However a small fiber neuropathy is likely responsible for patient's chronic paresthesias and can evade detection by this study. Since last seen the symptoms have gone away in the feet she started taking vitamin D. She still has paresthesias in her hands. 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 Hand gets numb, weakness in arms. Not in the face but in the shoulders, neck, arms, hands. More tingling.   Reviewed notes, labs and imaging from outside physicians, which showed:   HPI 01/31/2016:  Sabrina Gallagher is a 62 y.o. female here as a referral from Dr. Rolland Porter for tingling in the arms and the legs. Past medical history of B12 deficiency, prediabetes, premature ovarian failure and osteoporosis, hyperlipidemia, obesity. Symptoms Started in November with tingling in the legs and arms. She went to the ED and to the primary. It was  painful, accompanied by SOB and she felt like she was going to pass out. Thought it was cardiac in nature. She was evaluated by cardiology and everything was fine. Symptoms were precipitated by stress in her life,  she got a promotion in November, then everything spiraled out of control, she was trying to do too much and was eating horribly and not sleeping well.  Tingling was in the top of the feet and in the calfs. Early on it was in the fingers then that resolved and just left the feet. No burning or numbness just tingling. Heat exacerbated her symptoms. Worse with over exertion and walking and exercising also positional in nature. She denied any other associated, No weakness, no headache, no dysarthria, no vision changes, no dysphagia. B12 was found to be low normal and now taking a B complex vitamin. That is helping. She is not as fatigued now. She feels better.She is exercising more. . Never had any neurologic issues in her life previous to this. She has been reasonable healthy. Recently she has been more conscious of what she has been eating.  She has difficulty with things on her feet still. Her hgba1c was recently 6.1. She will be following up in gastroenterology as well as chest pain was thought to be GI related per cardiology.   Reviewed notes, labs and imaging from outside physicians, which showed: Patient was seen in the emergency room in November 2016 reporting tingling in the legs arms and hands with onset November. She reported the symptoms gradually worsening with onset day before. She also reported associated generalized weakness, denies shortness of breath, chest pain, fevers or chills. She also described dizziness, lightheadedness, chest pain, numbness and shortness of breath for the past several weeks. Neurologic exam in the emergency room was nonfocal. EKG showed normal sinus rhythm with nonspecific T-wave abnormality. QTc was 396 (personally reviewed tracing and findings are nonspecific).  She was discharge with follow-up with cardiology. She was evaluated by cardiology in December 2016 who thought that her symptoms were GI origin or related to increased stress at work EKG nonspecific. They recommended an echocardiogram and stress reduction.  Notes from [place] show that hemoglobin A1c was 6.1, B12 306, folate 14, CBC unremarkable, TSH 1.57. Vitamin D 25 OH 37.  Chest x-ray 09/01/2015,: No radiographic evidence of acute cardiopulmonary disease.     Review of Systems: Patient complains of symptoms per HPI as well as the following symptoms: decreased energy, change in appetite, snorng, numbness, feeling hot, SOB, weight lossm, fatigue. Pertinent negatives per HPI. All others negative.   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  Tobacco Use   Smoking status: Never   Smokeless tobacco: Never  Vaping Use   Vaping Use: Never used  Substance and Sexual Activity   Alcohol use: Not Currently   Drug use: No   Sexual activity: Yes    Birth control/protection: Post-menopausal    Comment: 1st intercourse 62 yo-Fewer than 5 partners  Other Topics Concern   Not on file  Social History Narrative   Right handed   Caffeine use: rare    Social Determinants of Health   Financial Resource Strain: Not on file  Food Insecurity: Not on file  Transportation Needs: Not on file  Physical Activity: Not on file  Stress: Not on file  Social Connections: Not on file  Intimate Partner Violence: Not on file    Family History  Problem Relation Age of Onset   Colon cancer Father    Cancer Mother        Oral-tongue   Neurofibromatosis Neg Hx     Past Medical History:  Diagnosis Date   Abnormal EKG    Fatigue    Osteoporosis 11/2017   T score -3.0   Pap smear of cervix  with ASCUS, cannot exclude HGSIL 08/2015   colposcopy biopsy 12:00 with LGSIL negative ECC   Premature ovarian failure age 75   SOB (shortness of breath)     Past Surgical History:  Procedure Laterality Date   No surgical history      Current Outpatient Medications  Medication Sig Dispense Refill   B Complex Vitamins (B COMPLEX PO) daily.     Ferrous Sulfate (IRON PO) daily.     VITAMIN D PO Take 5,000 Int'l Units by mouth daily.     No current facility-administered medications for this visit.    Allergies as of 01/29/2023   (No Known Allergies)    Vitals: BP 122/78 (BP Location: Right Arm, Patient Position: Sitting) Comment: recheck  Pulse 60   Ht 5\' 6"  (1.676 m)   Wt 174 lb (78.9 kg)   BMI 28.08 kg/m  Last Weight:  Wt Readings from Last 1 Encounters:  01/29/23 174 lb (78.9 kg)   Last Height:   Ht Readings from Last 1 Encounters:  01/29/23 5\' 6"  (1.676 m)   Physical exam: Exam: Gen: NAD, conversant, well nourised, obese, well groomed                     CV: RRR, no MRG. No Carotid Bruits. No peripheral edema, warm, nontender Eyes: Conjunctivae clear without exudates or hemorrhage  Neuro: Detailed Neurologic Exam  Speech:    Speech is normal; fluent and spontaneous with normal comprehension.  Cognition:    The patient is oriented to person, place, and time;     recent and remote memory intact;     language fluent;     normal attention, concentration,     fund of knowledge Cranial Nerves:    The pupils are equal, round, and reactive to light. The fundi are normal and spontaneous venous pulsations are present. Visual fields are full to finger confrontation. Extraocular movements are intact. Trigeminal sensation is intact and the muscles of mastication are normal. The face is symmetric. The palate elevates in the midline. Hearing  intact. Voice is normal. Shoulder shrug is normal. The tongue has normal motion without fasciculations.    Coordination: nml  Gait: nml  Motor Observation:    No asymmetry, no atrophy, and no involuntary movements noted. Tone:    Normal muscle tone.    Posture:    Posture is normal. normal erect    Strength:    Strength is V/V in the upper and lower limbs.      Sensation: intact to LT     Reflex Exam:  DTR's:    Deep tendon reflexes in the upper and lower extremities are normal bilaterally.   Toes:    The toes are downgoing bilaterally.   Clonus:    Clonus is absent.    Assessment/Plan:   62 y.o. female here as a referral from Dr. Rolland Porter for tingling in the hands likely CTS (+Mcphalen's maneuver). Past medical history of B12 deficiency, prediabetes, premature ovarian failure and osteoporosis, hyperlipidemia, obesity.   CTS: will set up an emg/ncs but in th meantime wear wrist splints  MRI cervical spine: if PT doesn't help will order Physical therapy:  Physical Therapy: Cervical myofascial pain, forward posture contributing to neck pain.  Please evaluate and treat including dry needling, stretching, strengthening, manual therapy/massage, heating, TENS unit, exercising for scapular stabilization, pectoral stretching and rhomboid strengthening as clinically warranted as well as any other modality as recommended by evaluation. Also please show her some exercises for Carpal Tunnel Syndrome as an aside.   For fitting wrist splints Address: [address], [place], [place] [address] Hours:  [place] Born soon ? 9?AM Phone: [phone]  Orders Placed This Encounter  Procedures   Ambulatory referral to Physical Therapy   NCV with EMG(electromyography)     CC: Marda Stalker, PA-C  Sarina Ill, MD  [facility] [address] [place], [place] [address]  Phone [phone] Fax [phone]

## 2023-01-29 NOTE — Patient Instructions (Addendum)
CTS: will set up an emg/ncs but in th meantime wear wrist splints  MRI cervical spine: if PT doesn't help will order Physical therapy:  Physical Therapy: Cervical myofascial pain, forward posture contributing to neck pain.  Please evaluate and treat including dry needling, stretching, strengthening, manual therapy/massage, heating, TENS unit, exercising for scapular stabilization, pectoral stretching and rhomboid strengthening as clinically warranted as well as any other modality as recommended by evaluation. Also please show her some exercises for Carpal Tunnel Syndrome as an aside.   For fitting wrist splints Address: [address], [place], [place] [address] Hours:  [place] Born soon ? 9?AM Phone: [phone]  Carpal Tunnel Syndrome  Carpal tunnel syndrome is a condition that causes pain, numbness, and weakness in your hand and fingers. The carpal tunnel is a narrow area located on the palm side of your wrist. Repeated wrist motion or certain diseases may cause swelling within the tunnel. This swelling pinches the main nerve in the wrist. The main nerve in the wrist is called the median nerve. What are the causes? This condition may be caused by: Repeated and forceful wrist and hand motions. Wrist injuries. Arthritis. A cyst or tumor in the carpal tunnel. Fluid buildup during pregnancy. Use of tools that vibrate. Sometimes the cause of this condition is not known. What increases the risk? The following factors may make you more likely to develop this condition: Having a job that requires you to repeatedly or forcefully move your wrist or hand or requires you to use tools that vibrate. This may include jobs that involve using computers, working on an [employer], or working with [employer] such as Pension scheme manager. Being a woman. Having certain conditions, such as: Diabetes. Obesity. An underactive thyroid (hypothyroidism). Kidney failure. Rheumatoid arthritis. What are the  signs or symptoms? Symptoms of this condition include: A tingling feeling in your fingers, especially in your thumb, index, and middle fingers. Tingling or numbness in your hand. An aching feeling in your entire arm, especially when your wrist and elbow are bent for a long time. Wrist pain that goes up your arm to your shoulder. Pain that goes down into your palm or fingers. A weak feeling in your hands. You may have trouble grabbing and holding items. Your symptoms may feel worse during the night. How is this diagnosed? This condition is diagnosed with a medical history and physical exam. You may also have tests, including: Electromyogram (EMG). This test measures electrical signals sent by your nerves into the muscles. Nerve conduction study. This test measures how well electrical signals pass through your nerves. Imaging tests, such as X-rays, ultrasound, and MRI. These tests check for possible causes of your condition. How is this treated? This condition may be treated with: Lifestyle changes. It is important to stop or change the activity that caused your condition. Doing exercise and activities to strengthen and stretch your muscles and tendons (physical therapy). Making lifestyle changes to help with your condition and learning how to do your daily activities safely (occupational therapy). Medicines for pain and inflammation. This may include medicine that is injected into your wrist. A wrist splint or brace. Surgery. Follow these instructions at home: If you have a splint or brace: Wear the splint or brace as told by your health care provider. Remove it only as told by your health care provider. Loosen the splint or brace if your fingers tingle, become numb, or turn cold and blue. Keep the splint or brace clean. If the  splint or brace is not waterproof: Do not let it get wet. Cover it with a watertight covering when you take a bath or shower. Managing pain, stiffness, and  swelling If directed, put ice on the painful area. To do this: If you have a removeable splint or brace, remove it as told by your health care provider. Put ice in a plastic bag. Place a towel between your skin and the bag or between the splint or brace and the bag. Leave the ice on for 20 minutes, 2-3 times a day. Do not fall asleep with the cold pack on your skin. Remove the ice if your skin turns bright red. This is very important. If you cannot feel pain, heat, or cold, you have a greater risk of damage to the area. Move your fingers often to reduce stiffness and swelling. General instructions Take over-the-counter and prescription medicines only as told by your health care provider. Rest your wrist and hand from any activity that may be causing your pain. If your condition is work related, talk with your employer about changes that can be made, such as getting a wrist pad to use while typing. Do any exercises as told by your health care provider, physical therapist, or occupational therapist. Keep all follow-up visits. This is important. Contact a health care provider if: You have new symptoms. Your pain is not controlled with medicines. Your symptoms get worse. Get help right away if: You have severe numbness or tingling in your wrist or hand. Summary Carpal tunnel syndrome is a condition that causes pain, numbness, and weakness in your hand and fingers. It is usually caused by repeated wrist motions. Lifestyle changes and medicines are used to treat carpal tunnel syndrome. Surgery may be recommended. Follow your health care provider's instructions about wearing a splint, resting from activity, keeping follow-up visits, and calling for help. This information is not intended to replace advice given to you by your health care provider. Make sure you discuss any questions you have with your health care provider. Document Revised: 02/26/2020 Document Reviewed: 02/26/2020 Elsevier Patient  Education  [place].   Cervical Radiculopathy  Cervical radiculopathy means that a nerve in the neck (a cervical nerve) is pinched or bruised. This can happen because of an injury to the cervical spine (vertebrae) in the neck, or as a normal part of getting older. This condition can cause pain or loss of feeling (numbness) that runs from your neck all the way down to your arm and fingers. Often, this condition gets better with rest. Treatment may be needed if the condition does not get better. What are the causes? A neck injury. A bulging disk in your spine. Sudden muscle tightening (muscle spasms). Tight muscles in your neck due to overuse. Arthritis. Breakdown in the bones and joints of the spine (spondylosis) due to getting older. Bone spurs that form near the nerves in the neck. What are the signs or symptoms? Pain. The pain may: Run from the neck to the arm and hand. Be very bad or irritating. Get worse when you move your neck. Loss of feeling or tingling in your arm or hand. Weakness in your arm or hand, in very bad cases. How is this treated? In many cases, treatment is not needed for this condition. With rest, the condition often gets better over time. If treatment is needed, options may include: Wearing a soft neck collar (cervical collar) for short periods of time. Doing exercises (physical therapy) to strengthen your  neck muscles. Taking medicines. Having shots (injections) in your spine, in very bad cases. Having surgery. This may be needed if other treatments do not help. The type of surgery that is used will depend on the cause of your condition. Follow these instructions at home: If you have a soft neck collar: Wear it as told by your doctor. Take it off only as told by your doctor. Ask your doctor if you can take the collar off for cleaning and bathing. If you are allowed to take the collar off for cleaning or bathing: Follow instructions from your doctor  about how to take off the collar safely. Clean the collar by wiping it with mild soap and water and drying it completely. Take out any removable pads in the collar every 1-2 days. Wash them by hand with soap and water. Let them air-dry completely before you put them back in the collar. Check your skin under the collar for redness or sores. If you see any, tell your doctor. Managing pain     Take over-the-counter and prescription medicines only as told by your doctor. If told, put ice on the painful area. To do this: If you have a soft neck collar, take if off as told by your doctor. Put ice in a plastic bag. Place a towel between your skin and the bag. Leave the ice on for 20 minutes, 2-3 times a day. Take off the ice if your skin turns bright red. This is very important. If you cannot feel pain, heat, or cold, you have a greater risk of damage to the area. If using ice does not help, you can try using heat. Use the heat source that your doctor recommends, such as a moist heat pack or a heating pad. Place a towel between your skin and the heat source. Leave the heat on for 20-30 minutes. Take off the heat if your skin turns bright red. This is very important. If you cannot feel pain, heat, or cold, you have a greater risk of getting burned. You may try a gentle neck and shoulder rub (massage). Activity Rest as needed. Return to your normal activities when your doctor says that it is safe. Do exercises as told by your doctor or physical therapist. You may have to avoid lifting. Ask your doctor how much you can safely lift. General instructions Use a flat pillow when you sleep. Do not drive while wearing a soft neck collar. If you do not have a soft neck collar, ask your doctor if it is safe to drive while your neck heals. Ask your doctor if you should avoid driving or using machines while you are taking your medicine. Do not smoke or use any products that contain nicotine or tobacco. If  you need help quitting, ask your doctor. Keep all follow-up visits. Contact a doctor if: Your condition does not get better with treatment. Get help right away if: Your pain gets worse and medicine does not help. You lose feeling or feel weak in your hand, arm, face, or leg. You have a high fever. Your neck is stiff. You cannot control when you poop or pee (have incontinence). You have trouble with walking, balance, or talking. Summary Cervical radiculopathy means that a nerve in the neck is pinched or bruised. A nerve can get pinched from a bulging disk, arthritis, an injury to the neck, or other causes. Symptoms include pain, tingling, or loss of feeling that goes from the neck to the arm or hand.  Weakness in your arm or hand can happen in very bad cases. Treatment may include resting, wearing a soft neck collar, and doing exercises. You might need to take medicines for pain. In very bad cases, shots or surgery may be needed. This information is not intended to replace advice given to you by your health care provider. Make sure you discuss any questions you have with your health care provider. Document Revised: 04/21/2021 Document Reviewed: 04/21/2021 Elsevier Patient Education  [place].

## 2023-01-30 ENCOUNTER — Encounter (HOSPITAL_COMMUNITY): Payer: Self-pay | Admitting: Emergency Medicine

## 2023-01-30 ENCOUNTER — Ambulatory Visit (HOSPITAL_COMMUNITY): Admission: EM | Admit: 2023-01-30 | Discharge: 2023-01-30 | Disposition: A | Discharge status: Home / Self Care

## 2023-01-30 DIAGNOSIS — R09A2 Foreign body sensation, throat: Secondary | ICD-10-CM | POA: Diagnosis not present

## 2023-01-30 NOTE — ED Triage Notes (Signed)
Pt was eating beans and rice bowl around 615p and felt like choking and entire neck feels swollen and like a lump in throat.

## 2023-01-31 NOTE — ED Provider Notes (Signed)
[facility]    CSN: [id number] Arrival date & time: 01/30/23  1944      History   Chief Complaint Chief Complaint  Patient presents with   Choking    HPI Sabrina Gallagher is a 62 y.o. female.   Patient presents to urgent care for evaluation of foreign body sensation to the throat that started this evening approximately 3 hours ago when she was eating beans and a rice bowl for dinner. She states she has felt "something stuck in her throat" ever since eating dinner. Describes feeling as a "lump in the throat" and reports sensation of neck swelling. Reports discomfort/pain with swallowing. Denies cough, itching to the throat, sensation of throat closure, shortness of breath, heart palpitations, chest pain, and history of problems swallowing. She has been drinking water since the incident happened without problem. No nausea, vomiting, or abdominal pain. No acid reflux or GERD symptoms. Denies changes in voice sounds, allergies to foods, or recent intake of foods outside of normal diet. No rash.     Past Medical History:  Diagnosis Date   Abnormal EKG    Fatigue    Osteoporosis 11/2017   T score -3.0   Pap smear of cervix with ASCUS, cannot exclude HGSIL 08/2015   colposcopy biopsy 12:00 with LGSIL negative ECC   Premature ovarian failure age 54   SOB (shortness of breath)     Patient Active Problem List   Diagnosis Date Noted   Cervical myofascial pain syndrome 01/29/2023   Paresthesias 04/24/2016   Muscle pain 04/24/2016   Small fiber neuropathy 01/31/2016   B12 deficiency 01/31/2016   Pre-diabetes 01/31/2016   Reflux    Premature ovarian failure    OVARIAN FAILURE, PREMATURE 06/23/2010   HYPERLIPIDEMIA-MIXED 06/23/2010   WEIGHT GAIN 06/23/2010   HEARTBURN 06/23/2010    Past Surgical History:  Procedure Laterality Date   No surgical history      OB History     Gravida  3   Para  2   Term  2   Preterm      AB  1   Living  2      SAB       IAB      Ectopic      Multiple      Live Births               Home Medications    Prior to Admission medications   Medication Sig Start Date End Date Taking? Authorizing Provider  B Complex Vitamins (B COMPLEX PO) daily.    [provider]  Ferrous Sulfate (IRON PO) daily.    [provider]  VITAMIN D PO Take 5,000 Int'l Units by mouth daily.    [provider]    Family History Family History  Problem Relation Age of Onset   Colon cancer Father    Cancer Mother        Oral-tongue   Neurofibromatosis Neg Hx     Social History Social History   Tobacco Use   Smoking status: Never   Smokeless tobacco: Never  Vaping Use   Vaping Use: Never used  Substance Use Topics   Alcohol use: Not Currently   Drug use: No     Allergies   Patient has no known allergies.   Review of Systems Review of Systems Per HPI  Physical Exam Triage Vital Signs ED Triage Vitals  Enc Vitals Group     BP 01/30/23 2112 Marland Kitchen)  145/92     Pulse Rate 01/30/23 2112 61     Resp 01/30/23 2112 16     Temp 01/30/23 2112 98.1 F (36.7 C)     Temp Source 01/30/23 2112 Oral     SpO2 01/30/23 2112 97 %     Weight --      Height --      Head Circumference --      Peak Flow --      Pain Score 01/30/23 2110 5     Pain Loc --      Pain Edu? --      Excl. in [place]? --    No data found.  Updated Vital Signs BP (!) 145/92 (BP Location: Left Arm)   Pulse 61   Temp 98.1 F (36.7 C) (Oral)   Resp 16   SpO2 97%   Visual Acuity Right Eye Distance:   Left Eye Distance:   Bilateral Distance:    Right Eye Near:   Left Eye Near:    Bilateral Near:     Physical Exam Vitals and nursing note reviewed.  Constitutional:      Appearance: She is not ill-appearing or toxic-appearing.  HENT:     Head: Normocephalic and atraumatic.     Right Ear: Hearing and external ear normal.     Left Ear: Hearing and external ear normal.     Nose: Nose normal.     Mouth/Throat:      Lips: Pink.     Mouth: Mucous membranes are moist. No injury.     Tongue: No lesions. Tongue does not deviate from midline.     Palate: No mass and lesions.     Pharynx: Oropharynx is clear. Uvula midline. No pharyngeal swelling, oropharyngeal exudate, posterior oropharyngeal erythema or uvula swelling.     Tonsils: No tonsillar exudate or tonsillar abscesses.  Eyes:     General: Lids are normal. Vision grossly intact. Gaze aligned appropriately.     Extraocular Movements: Extraocular movements intact.     Conjunctiva/sclera: Conjunctivae normal.  Neck:     Trachea: Trachea and phonation normal.  Cardiovascular:     Rate and Rhythm: Normal rate and regular rhythm.     Heart sounds: Normal heart sounds, S1 normal and S2 normal.  Pulmonary:     Effort: Pulmonary effort is normal. No respiratory distress.     Breath sounds: Normal breath sounds and air entry. No wheezing, rhonchi or rales.  Musculoskeletal:     Cervical back: Neck supple. No edema, erythema, signs of trauma, rigidity, torticollis, tenderness or crepitus. No pain with movement, spinous process tenderness or muscular tenderness. Normal range of motion.  Lymphadenopathy:     Cervical: No cervical adenopathy.  Skin:    General: Skin is warm and dry.     Capillary Refill: Capillary refill takes less than 2 seconds.     Findings: No rash.  Neurological:     General: No focal deficit present.     Mental Status: She is alert and oriented to person, place, and time. Mental status is at baseline.     Cranial Nerves: No dysarthria or facial asymmetry.  Psychiatric:        Mood and Affect: Mood normal.        Speech: Speech normal.        Behavior: Behavior normal.        Thought Content: Thought content normal.        Judgment: Judgment normal.  UC Treatments / Results  Labs (all labs ordered are listed, but only abnormal results are displayed) Labs Reviewed - No data to display  EKG   Radiology No results  found.  Procedures Procedures (including critical care time)  Medications Ordered in UC Medications - No data to display  Initial Impression / Assessment and Plan / UC Course  I have reviewed the triage vital signs and the nursing notes.  Pertinent labs & imaging results that were available during my care of the patient were reviewed by me and considered in my medical decision making (see chart for details).   1. Foreign body sensation to throat Patient able to swallow and maintain secretions without difficulty. No rash, low suspicion for allergic reaction. Airway clear, no erythema or swelling to the posterior oropharynx. Lungs clear, therefore deferred imaging. No acute distress. May use ibuprofen as needed for pain and inflammation as needed. No indication for referral to ED for further workup. May follow-up with PCP.  Discussed physical exam and available lab work findings in clinic with patient.  Counseled patient regarding appropriate use of medications and potential side effects for all medications recommended or prescribed today. Discussed red flag signs and symptoms of worsening condition,when to call the PCP office, return to urgent care, and when to seek higher level of care in the emergency department. Patient verbalizes understanding and agreement with plan. All questions answered. Patient discharged in stable condition.    Final Clinical Impressions(s) / UC Diagnoses   Final diagnoses:  Foreign body sensation, throat   Discharge Instructions   None    ED Prescriptions   None    PDMP not reviewed this encounter.   Joella Prince [place], [place] 01/31/23 [phone]

## 2023-02-22 ENCOUNTER — Ambulatory Visit: Payer: 59 | Admitting: Obstetrics & Gynecology

## 2023-02-22 LAB — HM COLONOSCOPY

## 2023-02-28 ENCOUNTER — Other Ambulatory Visit: Payer: Self-pay

## 2023-02-28 ENCOUNTER — Ambulatory Visit (HOSPITAL_BASED_OUTPATIENT_CLINIC_OR_DEPARTMENT_OTHER): Attending: Neurology | Admitting: Physical Therapy

## 2023-02-28 ENCOUNTER — Encounter (HOSPITAL_BASED_OUTPATIENT_CLINIC_OR_DEPARTMENT_OTHER): Payer: Self-pay | Admitting: Physical Therapy

## 2023-02-28 DIAGNOSIS — G5603 Carpal tunnel syndrome, bilateral upper limbs: Secondary | ICD-10-CM | POA: Insufficient documentation

## 2023-02-28 DIAGNOSIS — R293 Abnormal posture: Secondary | ICD-10-CM | POA: Diagnosis not present

## 2023-02-28 DIAGNOSIS — M542 Cervicalgia: Secondary | ICD-10-CM | POA: Diagnosis present

## 2023-02-28 DIAGNOSIS — R202 Paresthesia of skin: Secondary | ICD-10-CM

## 2023-02-28 DIAGNOSIS — M7918 Myalgia, other site: Secondary | ICD-10-CM | POA: Insufficient documentation

## 2023-02-28 NOTE — Therapy (Addendum)
 OUTPATIENT PHYSICAL THERAPY CERVICAL EVALUATION   Patient Name: Sabrina Gallagher MRN: [id number] DOB:[birth year], 62 y.o., female Today's Date: 03/01/2023  END OF SESSION:    PT End of Session - 02/28/23       Visit Number 1     Number of Visits 12     Date for PT Re-Evaluation 04/11/23     Authorization Type [employer]     PT Start Time 4098     PT Stop Time 1029     PT Time Calculation (min) 52 min     Activity Tolerance Patient tolerated treatment well     Behavior During Therapy [facility] for tasks assessed/performed        Past Medical History:  Diagnosis Date   Abnormal EKG    Fatigue    Osteoporosis 11/2017   T score -3.0   Pap smear of cervix with ASCUS, cannot exclude HGSIL 08/2015   colposcopy biopsy 12:00 with LGSIL negative ECC   Premature ovarian failure age 52   SOB (shortness of breath)    Past Surgical History:  Procedure Laterality Date   No surgical history     Patient Active Problem List   Diagnosis Date Noted   Cervical myofascial pain syndrome 01/29/2023   Paresthesias 04/24/2016   Muscle pain 04/24/2016   Small fiber neuropathy 01/31/2016   B12 deficiency 01/31/2016   Pre-diabetes 01/31/2016   Reflux    Premature ovarian failure    OVARIAN FAILURE, PREMATURE 06/23/2010   HYPERLIPIDEMIA-MIXED 06/23/2010   WEIGHT GAIN 06/23/2010   HEARTBURN 06/23/2010     REFERRING PROVIDER: Anson Fret, MD  REFERRING DIAG:  M79.18 (ICD-10-CM) - Cervical myofascial pain syndrome  M54.2 (ICD-10-CM) - Neck pain  M54.2 (ICD-10-CM) - Cervical muscle pain  R29.3 (ICD-10-CM) - Poor posture  G56.03 (ICD-10-CM) - Bilateral carpal tunnel syndrome    THERAPY DIAG:  Cervicalgia  Paresthesia of skin  Rationale for Evaluation and Treatment: Rehabilitation  ONSET DATE: Nov/Dec 2023  SUBJECTIVE:                                                                                                                                                                                                          SUBJECTIVE STATEMENT: Pt reports she had some tingling in numbness in bilat hands and feet in 2017 and MD thought it was small fiber neuropathy.  Pt states she began feeling better in 2 months and her sx's resolved on their own.   Pt began having tingling in bilat hands and feet and cervical pain in Nov/Dec 2023 without  any specific MOI.  Pt was informed she had a vitamin D deficiency and began taking vitamin D.  Pt states that resolved the sx's in her feet.  Pt reports she feels better having reduced sx's when she is active.  Pt has increased tingling in sx's with sitting activities including using the computer, watching TV, and driving.  Pt has difficulty sleeping due to cervical pain and tingling in bilat hands.  Pt not sure if her sleeping position increases cervical pain.  Pt has a tempur-pedic bed which she can adjust which helps.  Pt has used ointments and heat which do help.   Pt saw MD and states MD thinks her pain due a lot to her posture.  She has been working on her posture.  Pt states MD thought it is improving including becoming more localized.  PT order indicated:  Physical Therapy: Cervical myofascial pain, forward posture contributing to neck pain. Please evaluate and treat including dry needling, stretching, strengthening, manual therapy/massage, heating, TENS unit, exercising for scapular stabilization, pectoral stretching and rhomboid strengthening as clinically warranted as well as any other modality as recommended by evaluation. Also please show her some exercises for Carpal Tunnel Syndrome as an aside. Especially dry needling.   Hand dominance: Right  PERTINENT HISTORY:  Osteoporosis  PAIN:  Are you having pain? Yes Pt states she always has cervical discomfort. NPRS:  4.5/10 current, 6.5-7/10 worst, 0/10 best Location:  L > R sided cervical and UT  Pt has tingling in bilat palms of hands including all fingers and thumbs.  PRECAUTIONS: Other:  osteoporosis  WEIGHT BEARING RESTRICTIONS: No  FALLS:  Has patient fallen in last 6 months? No   OCCUPATION: Pt is retired  PLOF: Independent.  Pt able to drive and perform seated activities   PATIENT GOALS: improve pain, mobility, and strength   NEXT MD VISIT: follow up in June   OBJECTIVE:   DIAGNOSTIC FINDINGS:  Pt denies any cervical x rays or MRI  PATIENT SURVEYS:  FOTO will give next visit  COGNITION: Overall cognitive status: Within functional limits for tasks assessed   POSTURE: mildly slumped posture.  PALPATION: TTP to bilat cervical paraspinals and bilat UT.  Pt has moderate soft tissue tightness in bilat UE.    CERVICAL ROM:   Active ROM A/PROM (deg) eval  Flexion WFL  Extension 46 deg  Right lateral flexion 48 deg  Left lateral flexion 48 deg  Right rotation WNL  Left rotation WNL   (Blank rows = not tested)  UPPER EXTREMITY ROM:  Active ROM Right eval Left eval  Shoulder flexion [facility]  Shoulder extension    Shoulder abduction [facility]  Shoulder adduction    Shoulder extension    Shoulder internal rotation    Shoulder external rotation    Elbow flexion    Elbow extension    Wrist flexion    Wrist extension    Wrist ulnar deviation    Wrist radial deviation    Wrist pronation    Wrist supination     (Blank rows = not tested)  UPPER EXTREMITY MMT:  MMT Right eval Left eval  Shoulder flexion 5/5 5/5  Shoulder extension    Shoulder abduction 5/5 5/5  Shoulder adduction    Shoulder extension    Shoulder internal rotation    Shoulder external rotation    Middle trapezius    Lower trapezius    Elbow flexion 5/5 5/5  Elbow extension    Wrist flexion 5/5  5/5  Wrist extension 5/5 5/5  Wrist ulnar deviation    Wrist radial deviation    Wrist pronation    Wrist supination    Grip strength WFL and symmetrical WFL and symmetrical   (Blank rows = not tested)  CERVICAL SPECIAL TESTS:  Phalen's Test:  positive  FUNCTIONAL  TESTS:    TODAY'S TREATMENT:                                                                                                                                Educated pt concerning correct sitting posture and using a lumbar towel roll to promote postural alignment.  PT educated pt on the importance of correct posture.  PT gave pt handouts consisting of ergonomically correct position at workstation, correct seated posture, and using lumbar towel roll.  PATIENT EDUCATION:  Education details: dx, POC, rationale of interventions/exercise, posture, proper ergonomic workstation, using a lumbar roll, objective findings, and relevant anatomy.   Person educated: Patient Education method: Chief Technology Officer Education comprehension: verbalized understanding and needs further education  HOME EXERCISE PROGRAM: Pt given handouts for posture and proper ergonomic work station  ASSESSMENT:  CLINICAL IMPRESSION: Patient is a 62 y.o. female with dx's of cervical myofascial pain syndrome, neck pain, cervical muscle pain, poor posture, and bilat carpal tunnel syndrome.  Pt has good cervical ROM and good strength t/o bilat UE's.  Pt feels better when she is active, and has increased tingling and sx's with sitting activities including using the computer, watching TV, and driving.  Pt has difficulty sleeping due to cervical pain and tingling in bilat hands.  Pt should benefit from skilled PT services to address impairments and improve overall function.    OBJECTIVE IMPAIRMENTS: decreased activity tolerance, decreased strength, increased muscle spasms, impaired UE functional use, postural dysfunction, and pain.   ACTIVITY LIMITATIONS: sitting and sleeping  PARTICIPATION LIMITATIONS: driving and using computer  PERSONAL FACTORS: 1 comorbidity: osteoporosis  are also affecting patient's functional outcome.   REHAB POTENTIAL: Good  CLINICAL DECISION MAKING: Stable/uncomplicated  EVALUATION COMPLEXITY:  Low   GOALS:   SHORT TERM GOALS: Target date: 03/21/2023   Pt will be independent and compliant with HEP for improved pain, posture, and function.   Baseline:  Goal status: INITIAL  2.  Pt will report at least a 25% improvement in pain and sx's overall.   Baseline:  Goal status: INITIAL  3.  Pt will demonstrate improved seated posture and report improved postural awareness.  Baseline:  Goal status: INITIAL   LONG TERM GOALS: Target date: 04/11/2023  Pt will be able to use her computer without significantly increased sx's.  Baseline:  Goal status: INITIAL  2.  Pt will report at least a 70% improvement in sleeping.  Baseline:  Goal status: INITIAL  3.  Pt will be able to drive without increased sx's.  Baseline:  Goal status: INITIAL  4.  Pt will demo improved soft tissue  tightness in bilat UT for reduced cervical tension and improved pain with daily activities.  Baseline:  Goal status: INITIAL     PLAN:  PT FREQUENCY: 2x/week  PT DURATION: 6 weeks  PLANNED INTERVENTIONS: Therapeutic exercises, Therapeutic activity, Neuromuscular re-education, Balance training, Gait training, Patient/Family education, Self Care, Aquatic Therapy, Dry Needling, Electrical stimulation, Cryotherapy, Moist heat, Taping, Ultrasound, Manual therapy, and Re-evaluation  PLAN FOR NEXT SESSION: Give FOTO next visit.  Postural strengthening and stability and STW to UT and cervical paraspinals.  Scap retractions, T band rows with retraction, T band ext with retraction, doorway pec stretch, wrist flexion and extension stretches, finger tendon gliding exercises.  Audie Clear III PT, DPT 03/01/23 6:47 PM     PHYSICAL THERAPY DISCHARGE SUMMARY  Visits from Start of Care: 1  Current functional level related to goals / functional outcomes: Unable to assess due to pt not being present at discharge.    Remaining deficits: Unable to assess due to pt not being present at discharge.     Education / Equipment: See above   Patient was initially evaluated on 02/28/2023.  She first cancelled her appt's due to being out of town.  She then cancelled her remaining appt's stating she was feeling better and believed she did not need to participate in treatment at this time.  Pt will be discharged from skilled PT due to self discharge.    Audie Clear III PT, DPT 12/20/23 8:39 AM

## 2023-03-20 ENCOUNTER — Encounter (HOSPITAL_BASED_OUTPATIENT_CLINIC_OR_DEPARTMENT_OTHER): Admitting: Physical Therapy

## 2023-03-22 ENCOUNTER — Encounter (HOSPITAL_BASED_OUTPATIENT_CLINIC_OR_DEPARTMENT_OTHER): Admitting: Physical Therapy

## 2023-03-28 ENCOUNTER — Ambulatory Visit (HOSPITAL_BASED_OUTPATIENT_CLINIC_OR_DEPARTMENT_OTHER): Admitting: Physical Therapy

## 2023-04-04 ENCOUNTER — Ambulatory Visit (HOSPITAL_BASED_OUTPATIENT_CLINIC_OR_DEPARTMENT_OTHER): Admitting: Physical Therapy

## 2023-04-06 ENCOUNTER — Encounter (HOSPITAL_BASED_OUTPATIENT_CLINIC_OR_DEPARTMENT_OTHER): Payer: Self-pay

## 2023-04-06 ENCOUNTER — Encounter (HOSPITAL_BASED_OUTPATIENT_CLINIC_OR_DEPARTMENT_OTHER): Admitting: Physical Therapy

## 2023-04-10 ENCOUNTER — Encounter (HOSPITAL_BASED_OUTPATIENT_CLINIC_OR_DEPARTMENT_OTHER): Admitting: Physical Therapy

## 2023-04-12 ENCOUNTER — Encounter

## 2023-04-12 ENCOUNTER — Encounter: Admitting: Neurology

## 2023-04-13 ENCOUNTER — Encounter (HOSPITAL_BASED_OUTPATIENT_CLINIC_OR_DEPARTMENT_OTHER): Admitting: Physical Therapy

## 2023-04-17 ENCOUNTER — Encounter (HOSPITAL_BASED_OUTPATIENT_CLINIC_OR_DEPARTMENT_OTHER)

## 2023-04-19 ENCOUNTER — Encounter (HOSPITAL_BASED_OUTPATIENT_CLINIC_OR_DEPARTMENT_OTHER)

## 2023-05-09 ENCOUNTER — Ambulatory Visit: Payer: 59 | Admitting: Obstetrics & Gynecology

## 2023-06-08 ENCOUNTER — Other Ambulatory Visit: Payer: Self-pay | Admitting: Obstetrics and Gynecology

## 2023-06-08 DIAGNOSIS — Z1231 Encounter for screening mammogram for malignant neoplasm of breast: Secondary | ICD-10-CM

## 2023-06-24 ENCOUNTER — Ambulatory Visit
Admission: EM | Admit: 2023-06-24 | Discharge: 2023-06-24 | Disposition: A | Discharge status: Home / Self Care | Attending: Internal Medicine | Admitting: Internal Medicine

## 2023-06-24 DIAGNOSIS — H6992 Unspecified Eustachian tube disorder, left ear: Secondary | ICD-10-CM

## 2023-06-24 MED ORDER — FLUTICASONE PROPIONATE 50 MCG/ACT NA SUSP
1.0000 | Freq: Every day | NASAL | 0 refills | Status: DC
Start: 2023-06-24 — End: 2023-07-24

## 2023-06-24 NOTE — ED Provider Notes (Signed)
[facility]    CSN: [id number] Arrival date & time: 06/24/23  1542      History   Chief Complaint Chief Complaint  Patient presents with   Otalgia    HPI Sabrina Gallagher is a 62 y.o. female presents for ear pain.  Patient reports 3 days of an intermittent left ear pain.  Denies any drainage from the ear, hearing changes, cough or cold symptoms.  No fevers or chills.  She has been taking Tylenol OTC with minimal improvement.  No other concerns at this time.   Otalgia   Past Medical History:  Diagnosis Date   Abnormal EKG    Fatigue    Osteoporosis 11/2017   T score -3.0   Pap smear of cervix with ASCUS, cannot exclude HGSIL 08/2015   colposcopy biopsy 12:00 with LGSIL negative ECC   Premature ovarian failure age 71   SOB (shortness of breath)     Patient Active Problem List   Diagnosis Date Noted   Cervical myofascial pain syndrome 01/29/2023   Paresthesias 04/24/2016   Muscle pain 04/24/2016   Small fiber neuropathy 01/31/2016   B12 deficiency 01/31/2016   Pre-diabetes 01/31/2016   Reflux    Premature ovarian failure    OVARIAN FAILURE, PREMATURE 06/23/2010   HYPERLIPIDEMIA-MIXED 06/23/2010   WEIGHT GAIN 06/23/2010   HEARTBURN 06/23/2010    Past Surgical History:  Procedure Laterality Date   No surgical history      OB History     Gravida  3   Para  2   Term  2   Preterm      AB  1   Living  2      SAB      IAB      Ectopic      Multiple      Live Births               Home Medications    Prior to Admission medications   Medication Sig Start Date End Date Taking? Authorizing Provider  fluticasone (FLONASE) 50 MCG/ACT nasal spray Place 1 spray into both nostrils daily. 06/24/23  Yes Radford Pax, NP  B Complex Vitamins (B COMPLEX PO) daily.    [provider]  Ferrous Sulfate (IRON PO) daily.    [provider]  VITAMIN D PO Take 5,000 Int'l Units by mouth daily.    [provider]     Family History Family History  Problem Relation Age of Onset   Colon cancer Father    Cancer Mother        Oral-tongue   Neurofibromatosis Neg Hx     Social History Social History   Tobacco Use   Smoking status: Never   Smokeless tobacco: Never  Vaping Use   Vaping status: Never Used  Substance Use Topics   Alcohol use: Not Currently   Drug use: No     Allergies   Patient has no known allergies.   Review of Systems Review of Systems  HENT:  Positive for ear pain.      Physical Exam Triage Vital Signs ED Triage Vitals  Encounter Vitals Group     BP 06/24/23 1551 (!) 152/91     Systolic BP Percentile --      Diastolic BP Percentile --      Pulse Rate 06/24/23 1551 76     Resp 06/24/23 1551 16     Temp 06/24/23 1551 98.3 F (36.8 C)  Temp Source 06/24/23 1551 Oral     SpO2 06/24/23 1551 98 %     Weight --      Height --      Head Circumference --      Peak Flow --      Pain Score 06/24/23 1549 3     Pain Loc --      Pain Education --      Exclude from Growth Chart --    No data found.  Updated Vital Signs BP (!) 152/91 (BP Location: Left Arm)   Pulse 76   Temp 98.3 F (36.8 C) (Oral)   Resp 16   SpO2 98%   Visual Acuity Right Eye Distance:   Left Eye Distance:   Bilateral Distance:    Right Eye Near:   Left Eye Near:    Bilateral Near:     Physical Exam Vitals and nursing note reviewed.  Constitutional:      General: She is not in acute distress.    Appearance: She is well-developed. She is not ill-appearing.  HENT:     Head: Normocephalic and atraumatic.     Right Ear: Tympanic membrane and ear canal normal.     Left Ear: Ear canal normal. A middle ear effusion is present. There is no impacted cerumen. No mastoid tenderness. Tympanic membrane is not erythematous.     Nose: No congestion.     Mouth/Throat:     Mouth: Mucous membranes are moist.     Pharynx: Oropharynx is clear. Uvula midline. No posterior oropharyngeal  erythema.     Tonsils: No tonsillar exudate or tonsillar abscesses.  Eyes:     Conjunctiva/sclera: Conjunctivae normal.     Pupils: Pupils are equal, round, and reactive to light.  Cardiovascular:     Rate and Rhythm: Normal rate and regular rhythm.     Heart sounds: Normal heart sounds.  Pulmonary:     Effort: Pulmonary effort is normal.     Breath sounds: Normal breath sounds.  Musculoskeletal:     Cervical back: Normal range of motion and neck supple.  Lymphadenopathy:     Cervical: No cervical adenopathy.  Skin:    General: Skin is warm and dry.  Neurological:     General: No focal deficit present.     Mental Status: She is alert and oriented to person, place, and time.  Psychiatric:        Mood and Affect: Mood normal.        Behavior: Behavior normal.      UC Treatments / Results  Labs (all labs ordered are listed, but only abnormal results are displayed) Labs Reviewed - No data to display  EKG   Radiology No results found.  Procedures Procedures (including critical care time)  Medications Ordered in UC Medications - No data to display  Initial Impression / Assessment and Plan / UC Course  I have reviewed the triage vital signs and the nursing notes.  Pertinent labs & imaging results that were available during my care of the patient were reviewed by me and considered in my medical decision making (see chart for details).     Reviewed exam and symptoms with patient.  No red flags.  Discussed eustachian tube dysfunction.  Trial of Flonase and over-the-counter Sudafed.  PCP follow-up if symptoms do not improve.  ER precautions reviewed and patient verbalized understanding. Final Clinical Impressions(s) / UC Diagnoses   Final diagnoses:  Eustachian tube dysfunction, left  Discharge Instructions      Start Flonase daily.  Can also start over-the-counter Sudafed to help dry up the fluid behind your ear.  Please follow-up with your PCP if your symptoms  do not improve.  Please go to the ER for any worsening symptoms.  I hope you feel better soon!    ED Prescriptions     Medication Sig Dispense Auth. Provider   fluticasone (FLONASE) 50 MCG/ACT nasal spray Place 1 spray into both nostrils daily. 15.8 mL Radford Pax, NP      PDMP not reviewed this encounter.   Radford Pax, NP 06/24/23 [phone]

## 2023-06-24 NOTE — Discharge Instructions (Signed)
Start Flonase daily.  Can also start over-the-counter Sudafed to help dry up the fluid behind your ear.  Please follow-up with your PCP if your symptoms do not improve.  Please go to the ER for any worsening symptoms.  I hope you feel better soon!

## 2023-06-24 NOTE — ED Triage Notes (Signed)
Pt presents to UC w/ c/o left ear pain x3 days. Pt has taken tylenol without relief.

## 2023-07-04 ENCOUNTER — Ambulatory Visit
Admission: RE | Admit: 2023-07-04 | Discharge: 2023-07-04 | Disposition: A | Discharge status: Home / Self Care | Source: Ambulatory Visit | Attending: Obstetrics and Gynecology | Admitting: Obstetrics and Gynecology

## 2023-07-04 DIAGNOSIS — Z1231 Encounter for screening mammogram for malignant neoplasm of breast: Secondary | ICD-10-CM

## 2023-07-19 ENCOUNTER — Other Ambulatory Visit: Payer: Self-pay | Admitting: Gastroenterology

## 2023-07-19 DIAGNOSIS — R1310 Dysphagia, unspecified: Secondary | ICD-10-CM

## 2023-07-24 ENCOUNTER — Ambulatory Visit (INDEPENDENT_AMBULATORY_CARE_PROVIDER_SITE_OTHER): Admitting: Obstetrics and Gynecology

## 2023-07-24 ENCOUNTER — Encounter: Payer: Self-pay | Admitting: Obstetrics and Gynecology

## 2023-07-24 VITALS — BP 124/82 | HR 65 | Ht 64.75 in | Wt 168.0 lb

## 2023-07-24 DIAGNOSIS — Z01419 Encounter for gynecological examination (general) (routine) without abnormal findings: Secondary | ICD-10-CM

## 2023-07-24 MED ORDER — ESTRADIOL 0.1 MG/GM VA CREA: 1.0000 | TOPICAL_CREAM | Freq: Every day | VAGINAL | 12 refills | Status: DC

## 2023-07-24 NOTE — Progress Notes (Unsigned)
62 y.o. y.o. female here for annual exam. She denies any PM bleeding.   No LMP recorded. Patient is postmenopausal.    Pelvic discharge: denies Pelvic pain: denies No h/o HRT use Last mammogram: 9/24 Last colonoscopy: UTD  Blood pressure 124/82, pulse 65, height 5' 4.75" (1.645 m), weight 168 lb (76.2 kg), SpO2 99%.     Component Value Date/Time   DIAGPAP  01/06/2022 1622    - Negative for intraepithelial lesion or malignancy (NILM)   ADEQPAP  01/06/2022 1622    Satisfactory for evaluation; transformation zone component PRESENT.   Denies any abnormal pap smears GYN HISTORY:    Component Value Date/Time   DIAGPAP  01/06/2022 1622    - Negative for intraepithelial lesion or malignancy (NILM)   ADEQPAP  01/06/2022 1622    Satisfactory for evaluation; transformation zone component PRESENT.    OB History  Gravida Para Term Preterm AB Living  3 2 2   1 2   SAB IAB Ectopic Multiple Live Births               # Outcome Date GA Lbr Len/2nd Weight Sex Type Anes PTL Lv  3 AB           2 Term           1 Term             Past Medical History:  Diagnosis Date   Abnormal EKG    Fatigue    Osteoporosis 11/2017   T score -3.0   Pap smear of cervix with ASCUS, cannot exclude HGSIL 08/2015   colposcopy biopsy 12:00 with LGSIL negative ECC   Premature ovarian failure age 29   SOB (shortness of breath)     Past Surgical History:  Procedure Laterality Date   No surgical history      Current Outpatient Medications on File Prior to Visit  Medication Sig Dispense Refill   B Complex Vitamins (B COMPLEX PO) daily.     Ferrous Sulfate (IRON PO) daily.     VITAMIN D PO Take 5,000 Int'l Units by mouth daily.     No current facility-administered medications on file prior to visit.    Social History   Socioeconomic History   Marital status: Married    Spouse name: Not on file   Number of children: 2   Years of education: 16   Highest education level: Not on file   Occupational History   Occupation: [employer] [place]  Tobacco Use   Smoking status: Never   Smokeless tobacco: Never  Vaping Use   Vaping status: Never Used  Substance and Sexual Activity   Alcohol use: Not Currently   Drug use: No   Sexual activity: Yes    Partners: Male    Birth control/protection: Post-menopausal    Comment: 1st intercourse 62 yo-Fewer than 5 partners  Other Topics Concern   Not on file  Social History Narrative   Right handed   Caffeine use: rare    Social Determinants of Health   Financial Resource Strain: Not on file  Food Insecurity: Not on file  Transportation Needs: Not on file  Physical Activity: Not on file  Stress: Not on file  Social Connections: Not on file  Intimate Partner Violence: Not on file    Family History  Problem Relation Age of Onset   Colon cancer Father    Cancer Mother        Oral-tongue   Neurofibromatosis  Neg Hx      No Known Allergies    Patient's last menstrual period was No LMP recorded. Patient is postmenopausal..          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     PE General appearance: alert, cooperative and appears stated age Head: Normocephalic, without obvious abnormality, atraumatic Neck: no adenopathy, supple, symmetrical, trachea midline and thyroid normal to inspection and palpation Lungs: clear to auscultation bilaterally Breasts: normal appearance, no masses or tenderness Heart: regular rate and rhythm Abdomen: soft, non-tender; bowel sounds normal; no masses,  no organomegaly Extremities: extremities normal, atraumatic, no cyanosis or edema Skin: Skin color, texture, turgor normal. No rashes or lesions Lymph nodes: Cervical, supraclavicular, and  axillary nodes normal. No abnormal inguinal nodes palpated Neurologic: Grossly normal     Pelvic: External genitalia:  no lesions              Urethra:  normal appearing urethra with no masses, tenderness or lesions              Bartholins and Skenes: normal                 Vagina: normal appearing vagina with normal color and discharge, no lesions.               Cervix: no lesions, no cervical motion tenderness               Bimanual Exam:  Uterus:  normal size, contour, position, consistency, mobility, non-tender              Adnexa: no mass, fullness, tenderness          Chaperone was present for exam.   A:         Well Woman GYN exam                             P:        Pap smear collected             Encouraged annual mammogram screening              Labs and immunizations with her primary             Discussed breast self exams             Enouraged healthy lifestyle practices with diet and exercise  Earley Favor

## 2023-10-19 ENCOUNTER — Encounter: Payer: Self-pay | Admitting: Obstetrics and Gynecology

## 2023-11-16 ENCOUNTER — Ambulatory Visit: Admitting: Obstetrics and Gynecology

## 2023-11-16 ENCOUNTER — Encounter: Payer: Self-pay | Admitting: Obstetrics and Gynecology

## 2023-11-16 VITALS — BP 120/78 | HR 68

## 2023-11-16 DIAGNOSIS — M8000XA Age-related osteoporosis with current pathological fracture, unspecified site, initial encounter for fracture: Secondary | ICD-10-CM

## 2023-11-16 DIAGNOSIS — N3000 Acute cystitis without hematuria: Secondary | ICD-10-CM | POA: Diagnosis not present

## 2023-11-16 DIAGNOSIS — M810 Age-related osteoporosis without current pathological fracture: Secondary | ICD-10-CM

## 2023-11-16 DIAGNOSIS — R102 Pelvic and perineal pain: Secondary | ICD-10-CM

## 2023-11-16 DIAGNOSIS — R350 Frequency of micturition: Secondary | ICD-10-CM | POA: Diagnosis not present

## 2023-11-16 DIAGNOSIS — Z1231 Encounter for screening mammogram for malignant neoplasm of breast: Secondary | ICD-10-CM

## 2023-11-16 LAB — URINALYSIS, COMPLETE W/RFL CULTURE
Bacteria, UA: NONE SEEN /[HPF]
Bilirubin Urine: NEGATIVE
Glucose, UA: NEGATIVE
Hgb urine dipstick: NEGATIVE
Hyaline Cast: NONE SEEN /[LPF]
Ketones, ur: NEGATIVE
Leukocyte Esterase: NEGATIVE
Nitrites, Initial: NEGATIVE
Protein, ur: NEGATIVE
RBC / HPF: NONE SEEN /[HPF] (ref 0–2)
Specific Gravity, Urine: 1.015 (ref 1.001–1.035)
pH: 5.5 (ref 5.0–8.0)

## 2023-11-16 MED ORDER — CIPROFLOXACIN HCL 500 MG PO TABS: 500.0000 mg | ORAL_TABLET | Freq: Two times a day (BID) | ORAL | 0 refills | Status: AC

## 2023-11-16 MED ORDER — FLUCONAZOLE 150 MG PO TABS: 150.0000 mg | ORAL_TABLET | Freq: Once | ORAL | 0 refills | Status: AC

## 2023-11-16 NOTE — Progress Notes (Signed)
   Acute Office Visit  Subjective:    Patient ID: Sabrina Gallagher, female    DOB: 03-27-1961, 63 y.o.   MRN: [id number]   HPI 63 y.o. presents today for Urinary Tract Infection (Uti//jj/Pt c/o urinary frequency & left side pelvic pain) . December had UTI and took cipro and felt better UA was neg but culture did show bacteria Symptoms of OAB returned UA with 0-5 wbc otherwise neg. She reports she is using estrogen cream  No LMP recorded. Patient is postmenopausal.    Review of Systems     Objective:    OBGyn Exam  BP 120/78   Pulse 68   SpO2 99%  Wt Readings from Last 3 Encounters:  07/24/23 168 lb (76.2 kg)  01/29/23 174 lb (78.9 kg)  01/06/22 199 lb (90.3 kg)      sveL stage 1-2 cystocele, no lesions, atrophic vaginitis  Patient informed chaperone available to be present for breast and/or pelvic exam. Patient has requested no chaperone to be present. Patient has been advised what will be completed during breast and pelvic exam.   Assessment & Plan:  OAB, recurrent UTI: referral to urogyn  Encouraged to continue estrogen Referral for dxa placed. She is not on any medication. UC pending. To begin cipro and take diflucan for yeast prevention after finishing cipro. RTC with any persistent or worsening s/s  Earley Favor

## 2023-11-17 LAB — TIQ- AMBIGUOUS ORDER

## 2023-11-21 LAB — URINE CULTURE
MICRO NUMBER:: 15982965
Result:: NO GROWTH
SPECIMEN QUALITY:: ADEQUATE

## 2023-11-21 LAB — TEST AUTHORIZATION

## 2023-11-28 ENCOUNTER — Ambulatory Visit (INDEPENDENT_AMBULATORY_CARE_PROVIDER_SITE_OTHER): Admitting: Obstetrics

## 2023-11-28 ENCOUNTER — Encounter: Payer: Self-pay | Admitting: Obstetrics

## 2023-11-28 VITALS — BP 136/89 | HR 65 | Ht 64.0 in | Wt 166.0 lb

## 2023-11-28 DIAGNOSIS — N3946 Mixed incontinence: Secondary | ICD-10-CM | POA: Insufficient documentation

## 2023-11-28 DIAGNOSIS — N8110 Cystocele, unspecified: Secondary | ICD-10-CM | POA: Diagnosis not present

## 2023-11-28 DIAGNOSIS — N952 Postmenopausal atrophic vaginitis: Secondary | ICD-10-CM | POA: Diagnosis not present

## 2023-11-28 DIAGNOSIS — K5900 Constipation, unspecified: Secondary | ICD-10-CM | POA: Insufficient documentation

## 2023-11-28 DIAGNOSIS — Z87440 Personal history of urinary (tract) infections: Secondary | ICD-10-CM | POA: Insufficient documentation

## 2023-11-28 LAB — POCT URINALYSIS DIPSTICK
Bilirubin, UA: NEGATIVE
Blood, UA: NEGATIVE
Glucose, UA: NEGATIVE
Ketones, UA: NEGATIVE
Leukocytes, UA: NEGATIVE
Nitrite, UA: NEGATIVE
Protein, UA: NEGATIVE
Spec Grav, UA: 1.02 (ref 1.010–1.025)
Urobilinogen, UA: 0.2 U/dL
pH, UA: 5.5 (ref 5.0–8.0)

## 2023-11-28 MED ORDER — GEMTESA 75 MG PO TABS: 75.0000 mg | ORAL_TABLET | Freq: Every day | ORAL | Status: DC

## 2023-11-28 MED ORDER — GEMTESA 75 MG PO TABS: 75.0000 mg | ORAL_TABLET | Freq: Every day | ORAL | 2 refills | Status: DC

## 2023-11-28 MED ORDER — PHENAZOPYRIDINE HCL 200 MG PO TABS: 200.0000 mg | ORAL_TABLET | Freq: Three times a day (TID) | ORAL | 0 refills | Status: DC | PRN

## 2023-11-28 NOTE — Assessment & Plan Note (Signed)
-   Rx cipro for presumed UTI with negative urine culture, reports improvement of symptoms - UA and urine culture 11/16/23 negative - advised to avoid antibiotic use in the absence of positive urine culture - Rx pyridium and use ibuprofen to assess symptoms to avoid antibiotic exposure due to disruption of micro biome and resistant uropathogens.

## 2023-11-28 NOTE — Assessment & Plan Note (Addendum)
-   For symptomatic vaginal atrophy options include lubrication with a water-based lubricant, personal hygiene measures and barrier protection against wetness, and estrogen replacement in the form of vaginal cream, vaginal tablets, or a time-released vaginal ring.   - resume vaginal estrogen 1g twice a week

## 2023-11-28 NOTE — Assessment & Plan Note (Signed)
-   For treatment of pelvic organ prolapse, we discussed options for management including expectant management, conservative management, and surgical management, such as Kegels, a pessary, pelvic floor physical therapy, and specific surgical procedures. - encouraged kegel exercises

## 2023-11-28 NOTE — Assessment & Plan Note (Signed)
-   POCT UA negative, bladder scan 8mL - urgency > stress, started when she started traveling to [place] to care for her mother - We discussed the symptoms of overactive bladder (OAB), which include urinary urgency, urinary frequency, nocturia, with or without urge incontinence.  While we do not know the exact etiology of OAB, several treatment options exist. We discussed management including behavioral therapy (decreasing bladder irritants, urge suppression strategies, timed voids, bladder retraining), physical therapy, medication; for refractory cases posterior tibial nerve stimulation, sacral neuromodulation, and intravesical botulinum toxin injection.  For anticholinergic medications, we discussed the potential side effects of anticholinergics including dry eyes, dry mouth, constipation, cognitive impairment and urinary retention. For Beta-3 agonist medication, we discussed the potential side effect of elevated blood pressure which is more likely to occur in individuals with uncontrolled hypertension. - encouraged fluid management, recommended around 64oz of fluid/day - start Kegel exercises with handout and instructions provided - monitor for dietary triggers and stress management with handout provided  - continue vaginal estrogen - trial of gemtesa with samples and Rx provided if no relief  - For treatment of stress urinary incontinence,  non-surgical options include expectant management, weight loss, physical therapy, as well as a pessary.  Surgical options include a midurethral sling, Burch urethropexy, and transurethral injection of a bulking agent. - encouraged to resume exercises

## 2023-11-28 NOTE — Assessment & Plan Note (Addendum)
-   For constipation, we reviewed the importance of a better bowel regimen.  We also discussed the importance of avoiding chronic straining, as it can exacerbate her pelvic floor symptoms; we discussed treating constipation and straining prior to surgery, as postoperative straining can lead to damage to the repair and recurrence of symptoms. We discussed initiating therapy with increasing fluid intake, fiber supplementation, stool softeners, and laxatives such as miralax.  - encouraged fiber supplementation to optimize stool consistency due to dietary fluctuations with travels - discussed association with urinary symptoms and pelvic floor dysfunction.

## 2023-11-28 NOTE — Progress Notes (Signed)
New Patient Evaluation and Consultation  Referring Provider: Earley Favor, MD PCP: Jarrett Soho, PA-C Date of Service: 11/28/2023  SUBJECTIVE Chief Complaint: Urinary Frequency Sabrina Gallagher is a 63 y.o. female here today for urinary frequency.)  History of Present Illness: Sabrina Gallagher is a 63 y.o. Black or African-American female seen in consultation at the request of Dr Karma Greaser for evaluation of urinary urgency and stage II pelvic organ prolapse.    Reports urinary symptoms since 05/2023 when she took on more caregiving duties for her mother in [place] and trying to move her to [facility]. Husband pending retirement 03/2024, currently lives in [place] Denies new medications, trauma, surgeries. Lost 30lb since 01/2023 and was plant based diet. However when she was traveling to [place], unable to control diet and increased stress.  Retired 10/2022. Urinary urgency and nocturia  Self directed pelvic floor exercises, vaginal estrogen cream Rx cipro for presumed UTI with negative urine culture, reports improvement of symptoms UA and urine culture 11/16/23 negative Stopped using vaginal estrogen for 2 months when she was in [place], symptoms worsened.  Reports constipation with dietary changes and lack of exercises when she was in [place]  Review of records significant for: Premature ovarian failure, Pre-DM, underwent PT for small fiber neuropathy and parethesias  Urinary Symptoms: Leaks urine with cough/ sneeze, with a full bladder, and with urgency Leaks 0-2 time(s) per days.  Denies pad use Patient is bothered by UI symptoms.  Day time voids 5-6.  Nocturia: 1 times per night to void. Voiding dysfunction:  does not empty bladder well.  Patient does not use a catheter to empty bladder.  When urinating, patient feels the need to urinate multiple times in a row Drinks: 16oz x 8 water per day starting 11/2022 when she retired, 16oz herbal tea 12oz sparkling water worsens bladder discomfort  UTIs: 1 UTI's  in the last year with negative urine culture. Symptoms of frequency urination day and night, minimal amount of SUI leakage Denies history of blood in urine, kidney or bladder stones, pyelonephritis, bladder cancer, and kidney cancer No results found for the last 90 days.   Pelvic Organ Prolapse Symptoms:                  Patient Denies a feeling of a bulge the vaginal area.  Bowel Symptom: Bowel movements: 2 time(s) per day Stool consistency: soft  Straining: no.  Splinting: no.  Incomplete evacuation: no.  Patient Denies accidental bowel leakage / fecal incontinence Bowel regimen: diet, fiber, and miralax PRN Last colonoscopy: Date 02/22/23 per patient 2013 per chart review, Results not available for review [facility] Colonoscopy          Current Care Gaps     Colonoscopy (Every 10 Years) Never done   No completion history exists for this topic.                 Sexual Function Sexually active: yes.  Sexual orientation: Straight Pain with sex: Yes, at the vaginal opening, has discomfort due to dryness  Pelvic Pain Denies pelvic pain  Past Medical History:  Past Medical History:  Diagnosis Date   Abnormal EKG    Fatigue    Osteoporosis 11/2017   T score -3.0   Pap smear of cervix with ASCUS, cannot exclude HGSIL 08/2015   colposcopy biopsy 12:00 with LGSIL negative ECC   Premature ovarian failure age 60   SOB (shortness of breath)      Past Surgical History:   Past  Surgical History:  Procedure Laterality Date   No surgical history       Past OB/GYN History: OB History  Gravida Para Term Preterm AB Living  3 2 2  1 2   SAB IAB Ectopic Multiple Live Births  1    2    # Outcome Date GA Lbr Len/2nd Weight Sex Type Anes PTL Lv  3 SAB           2 Term     F Vag-Spont   LIV  1 Term     M Vag-Spont   LIV    Vaginal deliveries: 2, largest infant 9lb Forceps/ Vacuum deliveries: 0, Cesarean section: 0 Menopausal: Yes, at age 55s, Denies vaginal bleeding since  menopause Contraception: postmenopausal. Last pap smear.  Any history of abnormal pap smears: no.    Component Value Date/Time   DIAGPAP  01/06/2022 1622    - Negative for intraepithelial lesion or malignancy (NILM)   ADEQPAP  01/06/2022 1622    Satisfactory for evaluation; transformation zone component PRESENT.    Medications: Patient has a current medication list which includes the following prescription(s): b complex vitamins, estradiol, ferrous sulfate, phenazopyridine, gemtesa, gemtesa, and vitamin d.   Allergies: Patient has no known allergies.   Social History:  Social History   Tobacco Use   Smoking status: Never   Smokeless tobacco: Never  Vaping Use   Vaping status: Never Used  Substance Use Topics   Alcohol use: Never   Drug use: No    Relationship status: married Patient lives with her husband.   Patient is not employed. Regular exercise: Yes: walking, strength training, cardio History of abuse: No  Family History:   Family History  Problem Relation Age of Onset   Cancer Mother        Oral-tongue   Colon cancer Father    Neurofibromatosis Neg Hx    Bladder Cancer Neg Hx    Uterine cancer Neg Hx      Review of Systems: Review of Systems  Constitutional:  Negative for fever, malaise/fatigue and weight loss.  Respiratory:  Negative for cough, shortness of breath and wheezing.   Cardiovascular:  Negative for chest pain, palpitations and leg swelling.  Gastrointestinal:  Positive for constipation. Negative for abdominal pain and blood in stool.  Genitourinary:  Positive for urgency. Negative for dysuria, frequency and hematuria.  Skin:  Negative for rash.  Neurological:  Negative for dizziness, weakness and headaches.  Endo/Heme/Allergies:  Does not bruise/bleed easily.  Psychiatric/Behavioral:  Negative for depression. The patient is not nervous/anxious.      OBJECTIVE Physical Exam: Vitals:   11/28/23 1346 11/28/23 1531  BP: (!) 153/93 136/89   Pulse: 71 65  Weight: 166 lb (75.3 kg)   Height: 5\' 4"  (1.626 m)     Physical Exam Constitutional:      General: She is not in acute distress.    Appearance: Normal appearance.  Genitourinary:     Bladder and urethral meatus normal.     No lesions in the vagina.     Right Labia: No rash, tenderness, lesions, skin changes or Bartholin's cyst.    Left Labia: No tenderness, lesions, skin changes, Bartholin's cyst or rash.    No vaginal discharge, erythema, tenderness, bleeding, ulceration or granulation tissue.     Anterior vaginal prolapse present.    Mild vaginal atrophy present.     Right Adnexa: not tender, not full and no mass present.    Left Adnexa: not tender,  not full and no mass present.    No cervical motion tenderness, discharge, friability, lesion, polyp or nabothian cyst.     Uterus is not enlarged, fixed, tender, irregular or prolapsed.     No uterine mass detected.    Urethral meatus caruncle not present.    No urethral prolapse, tenderness, mass, hypermobility, discharge or stress urinary incontinence with cough stress test present.     Bladder is not tender, urgency on palpation not present and masses not present.      Pelvic Floor: Levator muscle strength is 4/5.    Levator ani not tender, obturator internus not tender, no asymmetrical contractions present and no pelvic spasms present.    Anal wink present and BC reflex present. Cardiovascular:     Rate and Rhythm: Normal rate.  Pulmonary:     Effort: Pulmonary effort is normal. No respiratory distress.  Abdominal:     General: There is no distension.     Palpations: Abdomen is soft. There is no mass.     Tenderness: There is no abdominal tenderness.     Hernia: No hernia is present.  Neurological:     Mental Status: She is alert.  Vitals reviewed. Exam conducted with a chaperone present.     POP-Q:   POP-Q  -2                                            Aa   -2                                            Ba  -6                                              C   2                                            Gh  2                                            Pb  7                                            tvl   -2                                            Ap  -2                                            Bp  -7  D    Post-Void Residual (PVR) by Bladder Scan: In order to evaluate bladder emptying, we discussed obtaining a postvoid residual and patient agreed to this procedure.  Procedure: The ultrasound unit was placed on the patient's abdomen in the suprapubic region after the patient had voided.    Post Void Residual - 11/28/23 1356       Post Void Residual   Post Void Residual 8 mL              Laboratory Results: Lab Results  Component Value Date   COLORU yellow 11/28/2023   CLARITYU Clear 11/28/2023   GLUCOSEUR Negative 11/28/2023   BILIRUBINUR Negative 11/28/2023   KETONESU Negative 11/28/2023   SPECGRAV 1.020 11/28/2023   RBCUR Negative 11/28/2023   PHUR 5.5 11/28/2023   PROTEINUR Negative 11/28/2023   UROBILINOGEN 0.2 11/28/2023   LEUKOCYTESUR Negative 11/28/2023    Lab Results  Component Value Date   CREATININE 0.80 06/17/2022   CREATININE 0.89 06/17/2022   CREATININE 0.79 09/22/2020    No results found for: "HGBA1C"  Lab Results  Component Value Date   HGB 13.6 06/17/2022     ASSESSMENT AND PLAN Ms. Hitchman is a 63 y.o. with:  1. Urinary incontinence, mixed   2. Vaginal atrophy   3. Constipation, unspecified constipation type   4. Pelvic organ prolapse quantification stage 1 cystocele   5. History of UTI     Urinary incontinence, mixed Assessment & Plan: - POCT UA negative, bladder scan 8mL - urgency > stress, started when she started traveling to [place] to care for her mother - We discussed the symptoms of overactive bladder (OAB), which include urinary urgency, urinary frequency,  nocturia, with or without urge incontinence.  While we do not know the exact etiology of OAB, several treatment options exist. We discussed management including behavioral therapy (decreasing bladder irritants, urge suppression strategies, timed voids, bladder retraining), physical therapy, medication; for refractory cases posterior tibial nerve stimulation, sacral neuromodulation, and intravesical botulinum toxin injection.  For anticholinergic medications, we discussed the potential side effects of anticholinergics including dry eyes, dry mouth, constipation, cognitive impairment and urinary retention. For Beta-3 agonist medication, we discussed the potential side effect of elevated blood pressure which is more likely to occur in individuals with uncontrolled hypertension. - encouraged fluid management, recommended around 64oz of fluid/day - start Kegel exercises with handout and instructions provided - monitor for dietary triggers and stress management with handout provided  - continue vaginal estrogen - trial of gemtesa with samples and Rx provided if no relief  - For treatment of stress urinary incontinence,  non-surgical options include expectant management, weight loss, physical therapy, as well as a pessary.  Surgical options include a midurethral sling, Burch urethropexy, and transurethral injection of a bulking agent. - encouraged to resume exercises  Orders: -     Phenazopyridine HCl; Take 1 tablet (200 mg total) by mouth 3 (three) times daily as needed for pain.  Dispense: 10 tablet; Refill: 0 -     Gemtesa; Take 1 tablet (75 mg total) by mouth daily. Leslye Peer; Take 1 tablet (75 mg total) by mouth daily.  Dispense: 30 tablet; Refill: 2 -     POCT urinalysis dipstick  Vaginal atrophy Assessment & Plan: - For symptomatic vaginal atrophy options include lubrication with a water-based lubricant, personal hygiene measures and barrier protection against wetness, and estrogen replacement  in the form of vaginal cream, vaginal tablets, or a time-released vaginal ring.   -  resume vaginal estrogen 1g twice a week  Orders: -     Phenazopyridine HCl; Take 1 tablet (200 mg total) by mouth 3 (three) times daily as needed for pain.  Dispense: 10 tablet; Refill: 0  Constipation, unspecified constipation type Assessment & Plan: - For constipation, we reviewed the importance of a better bowel regimen.  We also discussed the importance of avoiding chronic straining, as it can exacerbate her pelvic floor symptoms; we discussed treating constipation and straining prior to surgery, as postoperative straining can lead to damage to the repair and recurrence of symptoms. We discussed initiating therapy with increasing fluid intake, fiber supplementation, stool softeners, and laxatives such as miralax.  - encouraged fiber supplementation to optimize stool consistency due to dietary fluctuations with travels - discussed association with urinary symptoms and pelvic floor dysfunction.   Pelvic organ prolapse quantification stage 1 cystocele Assessment & Plan: - For treatment of pelvic organ prolapse, we discussed options for management including expectant management, conservative management, and surgical management, such as Kegels, a pessary, pelvic floor physical therapy, and specific surgical procedures. - encouraged kegel exercises   History of UTI Assessment & Plan: - Rx cipro for presumed UTI with negative urine culture, reports improvement of symptoms - UA and urine culture 11/16/23 negative - advised to avoid antibiotic use in the absence of positive urine culture - Rx pyridium and use ibuprofen to assess symptoms to avoid antibiotic exposure due to disruption of micro biome and resistant uropathogens.  Orders: -     POCT urinalysis dipstick  Time spent: I spent 72 minutes dedicated to the care of this patient on the date of this encounter to include pre-visit review of records,  face-to-face time with the patient discussing mixed urinary incontinence, history of UTI, vaginal atrophy, stage I pelvic organ prolapse, and post visit documentation and ordering medication/ testing.    Loleta Chance, MD

## 2023-11-28 NOTE — Patient Instructions (Addendum)
We discussed the symptoms of overactive bladder (OAB), which include urinary urgency, urinary frequency, night-time urination, with or without urge incontinence.  We discussed management including behavioral therapy (decreasing bladder irritants by following a bladder diet, urge suppression strategies, timed voids, bladder retraining), physical therapy, medication; and for refractory cases posterior tibial nerve stimulation, sacral neuromodulation, and intravesical botulinum toxin injection.   For Beta-3 agonist medication, we discussed the potential side effect of elevated blood pressure which is more likely to occur in individuals with uncontrolled hypertension. You were given samples for Gemtesa 75 mg.  It can take a month to start working so give it time, but if you have bothersome side effects call sooner and we can try a different medication.  Call us if you have trouble filling the prescription or if it's not covered by your insurance.  You have a stage 1 (out of 4) prolapse.  We discussed the fact that it is not life threatening but there are several treatment options. For treatment of pelvic organ prolapse, we discussed options for management including expectant management, conservative management, and surgical management, such as Kegels, a pessary, pelvic floor physical therapy, and specific surgical procedures.     Women should try to eat at least 21 to 25 grams of fiber a day, while men should aim for 30 to 38 grams a day. You can add fiber to your diet with food or a fiber supplement such as psyllium (metamucil), benefiber, or fibercon.   Here's a look at how much dietary fiber is found in some common foods. When buying packaged foods, check the Nutrition Facts label for fiber content. It can vary among brands.  Fruits Serving size Total fiber (grams)*  Raspberries 1 cup 8.0  Pear 1 medium 5.5  Apple, with skin 1 medium 4.5  Banana 1 medium 3.0  Orange 1 medium 3.0  Strawberries 1 cup  3.0   Vegetables Serving size Total fiber (grams)*  Green peas, boiled 1 cup 9.0  Broccoli, boiled 1 cup chopped 5.0  Turnip greens, boiled 1 cup 5.0  Brussels sprouts, boiled 1 cup 4.0  Potato, with skin, baked 1 medium 4.0  Sweet corn, boiled 1 cup 3.5  Cauliflower, raw 1 cup chopped 2.0  Carrot, raw 1 medium 1.5   Grains Serving size Total fiber (grams)*  Spaghetti, whole-wheat, cooked 1 cup 6.0  Barley, pearled, cooked 1 cup 6.0  Bran flakes 3/4 cup 5.5  Quinoa, cooked 1 cup 5.0  Oat bran muffin 1 medium 5.0  Oatmeal, instant, cooked 1 cup 5.0  Popcorn, air-popped 3 cups 3.5  Brown rice, cooked 1 cup 3.5  Bread, whole-wheat 1 slice 2.0  Bread, rye 1 slice 2.0   Legumes, nuts and seeds Serving size Total fiber (grams)*  Split peas, boiled 1 cup 16.0  Lentils, boiled 1 cup 15.5  Black beans, boiled 1 cup 15.0  Baked beans, canned 1 cup 10.0  Chia seeds 1 ounce 10.0  Almonds 1 ounce (23 nuts) 3.5  Pistachios 1 ounce (49 nuts) 3.0  Sunflower kernels 1 ounce 3.0  *Rounded to nearest 0.5 gram. Source: [employer] for [employer], Legacy Release    For symptomatic vaginal atrophy options include lubrication with a water-based lubricant, personal hygiene measures and barrier protection against wetness, and estrogen replacement in the form of vaginal cream, vaginal tablets, or a time-released vaginal ring.     For vaginal atrophy (thinning of the vaginal tissue that can cause dryness and burning) and UTI  prevention we discussed estrogen replacement in the form of vaginal cream.   Start vaginal estrogen therapy nightly for two weeks then 2 times weekly at night. This can be placed with your finger or an applicator inside the vagina and around the urethra.  Please let us know if the prescription is too expensive and we can look for alternative options.   Is vaginal estrogen therapy safe for me? Vaginal estrogen preparations act on the vaginal skin, and  only a very tiny amount is absorbed into the bloodstream (0.01%).  They work in a similar way to hand or face cream.  There is minimal absorption and they are therefore perfectly safe. If you have had breast cancer and have persistent troublesome symptoms which aren't settling with vaginal moisturisers and lubricants, local estrogen treatment may be a possibility, but consultation with your oncologist should take place first.

## 2023-12-03 ENCOUNTER — Telehealth: Payer: Self-pay

## 2023-12-03 DIAGNOSIS — N3946 Mixed incontinence: Secondary | ICD-10-CM

## 2023-12-03 NOTE — Telephone Encounter (Signed)
PA for Sabrina Gallagher is not approved due to no trial and failure of the following medications. Please see communication from [employer].         Has the patient had a 12-week trial of ONE of the following medications AND experienced therapeutic failure: tolterodine extended-release (Detrol LA), oxybutynin IR, oxybutynin ER, trospium (Sanctura), solifenacin (Vesicare), darifenacin (Enablex) or fesoterodine (Toviaz)?*  Would you like to try an alternative treatment?

## 2023-12-04 MED ORDER — TROSPIUM CHLORIDE ER 60 MG PO CP24: 1.0000 | ORAL_CAPSULE | Freq: Every day | ORAL | 2 refills | Status: DC

## 2023-12-04 NOTE — Telephone Encounter (Signed)
Patient has been notified and aware. She will keep this medication on file if she decides to use it.

## 2024-01-09 ENCOUNTER — Ambulatory Visit: Admitting: Obstetrics

## 2024-01-22 ENCOUNTER — Encounter: Payer: Self-pay | Admitting: Neurology

## 2024-01-23 ENCOUNTER — Ambulatory Visit
Admission: RE | Admit: 2024-01-23 | Discharge: 2024-01-23 | Disposition: A | Discharge status: Home / Self Care | Source: Ambulatory Visit | Attending: Family Medicine | Admitting: Family Medicine

## 2024-01-23 ENCOUNTER — Other Ambulatory Visit: Payer: Self-pay

## 2024-01-23 VITALS — BP 136/89 | HR 65 | Temp 98.0°F | Resp 17 | Ht 64.0 in | Wt 163.0 lb

## 2024-01-23 DIAGNOSIS — T148XXA Other injury of unspecified body region, initial encounter: Secondary | ICD-10-CM

## 2024-01-23 NOTE — Discharge Instructions (Signed)
 Drink plenty of water, take vitamin C supplements, over-the-counter anti-inflammatory such as Tylenol and Motrin as the package for pain, stay active, stretching may help.  Follow-up as needed

## 2024-01-23 NOTE — ED Notes (Signed)
 EKG completed and handed to [facility].

## 2024-01-23 NOTE — ED Triage Notes (Addendum)
 Pt presents with complaints of bilateral arm pain, shoulder pain, neck pain, and discomfort in the chest. Pt states the pain in chest feels similar to heartburn. Pt reports her pain was a 10/10 last night. Took shower + applied heat. Pt states she did a workout class in the AM 3/25, mainly focused on the upper extremities.

## 2024-01-23 NOTE — ED Provider Notes (Signed)
 [facility]    CSN: [id number] Arrival date & time: 01/23/24  0841      History   Chief Complaint Chief Complaint  Patient presents with   Arm Pain    Both arms , shoulders and neck ache and feel very cold.   Both Hands have slight tingling.  Tightness in chest area especially when moving neck.  First occurred two days ago. - Entered by patient     HPI Kayleena Eke is a 63 y.o. female.   The history is provided by the patient.  Arm Pain  Pain both arms and chest, associated with some tingling in her fingers.  Patient states she took an exercise class yesterday, it was a cardio class using free weights, she has been taking classes for 3 months 2-3 times a week and walking 3-4 times a week in an effort to lose weight.  States they did a lot of arm and chest exercises yesterday, after class she developed burning pain in both arms and a tight feeling across her chest.  No over-the-counter remedies tried.  Denies palpitations, shortness of breath, dizziness or lightheadedness, abdominal pain, nausea, vomiting.  Denies past medical history of hypertension.  No history of coronary artery disease.  Past Medical History:  Diagnosis Date   Abnormal EKG    Fatigue    Osteoporosis 11/2017   T score -3.0   Pap smear of cervix with ASCUS, cannot exclude HGSIL 08/2015   colposcopy biopsy 12:00 with LGSIL negative ECC   Premature ovarian failure age 14   SOB (shortness of breath)     Patient Active Problem List   Diagnosis Date Noted   Urinary incontinence, mixed 11/28/2023   Vaginal atrophy 11/28/2023   Constipation 11/28/2023   Pelvic organ prolapse quantification stage 1 cystocele 11/28/2023   History of UTI 11/28/2023   Cervical myofascial pain syndrome 01/29/2023   Paresthesias 04/24/2016   Muscle pain 04/24/2016   Small fiber neuropathy 01/31/2016   B12 deficiency 01/31/2016   Pre-diabetes 01/31/2016   Reflux    Premature ovarian failure    OVARIAN FAILURE,  PREMATURE 06/23/2010   HYPERLIPIDEMIA-MIXED 06/23/2010   WEIGHT GAIN 06/23/2010   HEARTBURN 06/23/2010    Past Surgical History:  Procedure Laterality Date   No surgical history      OB History     Gravida  3   Para  2   Term  2   Preterm      AB  1   Living  2      SAB  1   IAB      Ectopic      Multiple      Live Births  2            Home Medications    Prior to Admission medications   Medication Sig Start Date End Date Taking? Authorizing Provider  B Complex Vitamins (B COMPLEX PO) daily.    [provider]  estradiol (ESTRACE VAGINAL) 0.1 MG/GM vaginal cream Place 1 Applicatorful vaginally at bedtime. Rub 1mg  in at bedtime for 3 weeks then use 3 times a week there after 07/24/23   Earley Favor, MD  Ferrous Sulfate (IRON PO) daily.    [provider]  phenazopyridine (PYRIDIUM) 200 MG tablet Take 1 tablet (200 mg total) by mouth 3 (three) times daily as needed for pain. 11/28/23   Loleta Chance, MD  Trospium Chloride 60 MG CP24 Take 1 capsule (60 mg total) by  mouth daily. 12/04/23   Wyatt Haste T, MD  VITAMIN D PO Take 5,000 Int'l Units by mouth daily.    [provider]    Family History Family History  Problem Relation Age of Onset   Cancer Mother        Oral-tongue   Colon cancer Father    Neurofibromatosis Neg Hx    Bladder Cancer Neg Hx    Uterine cancer Neg Hx     Social History Social History   Tobacco Use   Smoking status: Never   Smokeless tobacco: Never  Vaping Use   Vaping status: Never Used  Substance Use Topics   Alcohol use: Never   Drug use: No     Allergies   Patient has no known allergies.   Review of Systems Review of Systems   Physical Exam Triage Vital Signs ED Triage Vitals  Encounter Vitals Group     BP 01/23/24 0855 136/89     Systolic BP Percentile --      Diastolic BP Percentile --      Pulse Rate 01/23/24 0855 65     Resp 01/23/24 0855 17     Temp 01/23/24 0855 98  F (36.7 C)     Temp Source 01/23/24 0855 Oral     SpO2 01/23/24 0855 95 %     Weight 01/23/24 0854 163 lb (73.9 kg)     Height 01/23/24 0854 5\' 4"  (1.626 m)     Head Circumference --      Peak Flow --      Pain Score 01/23/24 0853 5     Pain Loc --      Pain Education --      Exclude from Growth Chart --    No data found.  Updated Vital Signs BP 136/89 (BP Location: Right Arm)   Pulse 65   Temp 98 F (36.7 C) (Oral)   Resp 17   Ht 5\' 4"  (1.626 m)   Wt 163 lb (73.9 kg)   SpO2 95%   BMI 27.98 kg/m   Visual Acuity Right Eye Distance:   Left Eye Distance:   Bilateral Distance:    Right Eye Near:   Left Eye Near:    Bilateral Near:     Physical Exam Vitals and nursing note reviewed.  Constitutional:      Appearance: She is not ill-appearing.  HENT:     Head: Normocephalic and atraumatic.     Right Ear: Tympanic membrane and ear canal normal.     Left Ear: Tympanic membrane and ear canal normal.     Mouth/Throat:     Mouth: Mucous membranes are moist.  Eyes:     Conjunctiva/sclera: Conjunctivae normal.     Pupils: Pupils are equal, round, and reactive to light.  Cardiovascular:     Rate and Rhythm: Normal rate and regular rhythm.     Heart sounds: Normal heart sounds.  Pulmonary:     Effort: Pulmonary effort is normal.     Breath sounds: Normal breath sounds.  Chest:     Chest wall: Tenderness (Diffuse upper chest wall tenderness) present.  Musculoskeletal:        General: Normal range of motion.     Cervical back: Neck supple.     Comments: Soft tissue tenderness bilateral biceps and triceps  Neurological:     Mental Status: She is alert and oriented to person, place, and time.  Psychiatric:        Mood  and Affect: Mood normal.      UC Treatments / Results  Labs (all labs ordered are listed, but only abnormal results are displayed) Labs Reviewed - No data to display  EKG   Radiology No results found.  Procedures Procedures (including  critical care time)  Medications Ordered in UC Medications - No data to display  Initial Impression / Assessment and Plan / UC Course  I have reviewed the triage vital signs and the nursing notes.  Pertinent labs & imaging results that were available during my care of the patient were reviewed by me and considered in my medical decision making (see chart for details).     EKG independently viewed by me sinus bradycardia with a rate of 57 normal axis normal intervals nonspecific T wave abnormalities no significant change from EKG dated June 17, 2022 Discussed with patient symptoms are musculoskeletal to likely due to her workout, recommend increase fluids, can take vitamin C, gentle stretching and OTC NSAIDs.  Follow-up as needed Final Clinical Impressions(s) / UC Diagnoses   Final diagnoses:  Musculoskeletal strain     Discharge Instructions      Drink plenty of water, take vitamin C supplements, over-the-counter anti-inflammatory such as Tylenol and Motrin as the package for pain, stay active, stretching may help.  Follow-up as needed   ED Prescriptions   None    PDMP not reviewed this encounter.   Meliton Rattan, [place] 01/23/24 [phone]

## 2024-02-07 ENCOUNTER — Other Ambulatory Visit: Payer: Self-pay

## 2024-02-07 ENCOUNTER — Emergency Department (HOSPITAL_COMMUNITY)
Admission: EM | Admit: 2024-02-07 | Discharge: 2024-02-07 | Disposition: A | Discharge status: Home / Self Care | Attending: Emergency Medicine | Admitting: Emergency Medicine

## 2024-02-07 DIAGNOSIS — R630 Anorexia: Secondary | ICD-10-CM | POA: Diagnosis not present

## 2024-02-07 DIAGNOSIS — R52 Pain, unspecified: Secondary | ICD-10-CM

## 2024-02-07 DIAGNOSIS — M791 Myalgia, unspecified site: Secondary | ICD-10-CM | POA: Insufficient documentation

## 2024-02-07 LAB — CBC WITH DIFFERENTIAL/PLATELET
Abs Immature Granulocytes: 0 10*3/uL (ref 0.00–0.07)
Basophils Absolute: 0.1 10*3/uL (ref 0.0–0.1)
Basophils Relative: 1 %
Eosinophils Absolute: 0.1 10*3/uL (ref 0.0–0.5)
Eosinophils Relative: 1 %
HCT: 40.6 % (ref 36.0–46.0)
Hemoglobin: 13.3 g/dL (ref 12.0–15.0)
Immature Granulocytes: 0 %
Lymphocytes Relative: 52 %
Lymphs Abs: 2.4 10*3/uL (ref 0.7–4.0)
MCH: 29.8 pg (ref 26.0–34.0)
MCHC: 32.8 g/dL (ref 30.0–36.0)
MCV: 91 fL (ref 80.0–100.0)
Monocytes Absolute: 0.4 10*3/uL (ref 0.1–1.0)
Monocytes Relative: 10 %
Neutro Abs: 1.6 10*3/uL — ABNORMAL LOW (ref 1.7–7.7)
Neutrophils Relative %: 36 %
Platelets: 220 10*3/uL (ref 150–400)
RBC: 4.46 MIL/uL (ref 3.87–5.11)
RDW: 12.8 % (ref 11.5–15.5)
WBC: 4.6 10*3/uL (ref 4.0–10.5)
nRBC: 0 % (ref 0.0–0.2)

## 2024-02-07 LAB — URINALYSIS, ROUTINE W REFLEX MICROSCOPIC
Bilirubin Urine: NEGATIVE
Glucose, UA: NEGATIVE mg/dL
Hgb urine dipstick: NEGATIVE
Ketones, ur: NEGATIVE mg/dL
Leukocytes,Ua: NEGATIVE
Nitrite: NEGATIVE
Protein, ur: NEGATIVE mg/dL
Specific Gravity, Urine: 1.009 (ref 1.005–1.030)
pH: 6 (ref 5.0–8.0)

## 2024-02-07 LAB — MAGNESIUM: Magnesium: 2.1 mg/dL (ref 1.7–2.4)

## 2024-02-07 LAB — HEPATIC FUNCTION PANEL
ALT: 13 U/L (ref 0–44)
AST: 28 U/L (ref 15–41)
Albumin: 4.2 g/dL (ref 3.5–5.0)
Alkaline Phosphatase: 44 U/L (ref 38–126)
Bilirubin, Direct: 0.2 mg/dL (ref 0.0–0.2)
Indirect Bilirubin: 0.3 mg/dL (ref 0.3–0.9)
Total Bilirubin: 0.5 mg/dL (ref 0.0–1.2)
Total Protein: 7.5 g/dL (ref 6.5–8.1)

## 2024-02-07 LAB — BASIC METABOLIC PANEL WITH GFR
Anion gap: 11 (ref 5–15)
BUN: 10 mg/dL (ref 8–23)
CO2: 24 mmol/L (ref 22–32)
Calcium: 9.6 mg/dL (ref 8.9–10.3)
Chloride: 103 mmol/L (ref 98–111)
Creatinine, Ser: 0.78 mg/dL (ref 0.44–1.00)
GFR, Estimated: >60 mL/min (ref >60)
Glucose, Bld: 102 mg/dL — ABNORMAL HIGH (ref 70–99)
Potassium: 3.7 mmol/L (ref 3.5–5.1)
Sodium: 138 mmol/L (ref 135–145)

## 2024-02-07 LAB — CK: Total CK: 284 U/L — ABNORMAL HIGH (ref 38–234)

## 2024-02-07 LAB — PHOSPHORUS: Phosphorus: 3.8 mg/dL (ref 2.5–4.6)

## 2024-02-07 LAB — TSH: TSH: 1.966 u[IU]/mL (ref 0.350–4.500)

## 2024-02-07 NOTE — ED Provider Notes (Signed)
 [place] EMERGENCY DEPARTMENT AT [facility] Provider Note   CSN: [id number] Arrival date & time: 02/07/24  1308     History  Chief Complaint  Patient presents with   Gen. Body/Muscle Aches    Hypertension    Jesly Hartmann is a 63 y.o. female.  The history is provided by the patient and medical records. No language interpreter was used.     Kiera is a 63 yo female with no significant cardiac history, presenting to the ER today for evaluation of generalized body aches and "heaviness". Pt states that she first began to experience increased extremity heaviness as well as generalized body aches around 3/26. She went to UC to be evaluated and the provider believed that this was MSK related d/t the patient stating that she exercises daily and had recently done weight training. She has also noticed that her BP fluctuates from high to low and she experiences more extremity heaviness with elevated Bps. She states she has also seen an increase in varicose veins in BLE but she denies experiencing LE edema. Pt also states that she has noticed a infrequent bowel movements and decrease in appetite since this began as well, with early satiety occurring in the evening. She states that she does not currently take any prescription medications, only OTC vitamins. Currently, she endorse chills, chest tenderness and tightness, lightheadedness and palpitations but denies fever, SOB, cough, headaches, vision changes, or abd pain.  Home Medications Prior to Admission medications   Medication Sig Start Date End Date Taking? Authorizing Provider  B Complex Vitamins (B COMPLEX PO) daily.    [provider]  estradiol (ESTRACE VAGINAL) 0.1 MG/GM vaginal cream Place 1 Applicatorful vaginally at bedtime. Rub 1mg  in at bedtime for 3 weeks then use 3 times a week there after 07/24/23   Earley Favor, MD  Ferrous Sulfate (IRON PO) daily.    [provider]  phenazopyridine (PYRIDIUM)  200 MG tablet Take 1 tablet (200 mg total) by mouth 3 (three) times daily as needed for pain. 11/28/23   Loleta Chance, MD  Trospium Chloride 60 MG CP24 Take 1 capsule (60 mg total) by mouth daily. 12/04/23   Wyatt Haste T, MD  VITAMIN D PO Take 5,000 Int'l Units by mouth daily.    [provider]      Allergies    Patient has no known allergies.    Review of Systems   Review of Systems  All other systems reviewed and are negative.   Physical Exam Updated Vital Signs BP (!) 146/87   Pulse (!) 55   Temp 98 F (36.7 C) (Oral)   Resp 14   SpO2 100%  Physical Exam Vitals and nursing note reviewed.  Constitutional:      General: She is not in acute distress.    Appearance: She is well-developed.  HENT:     Head: Atraumatic.  Eyes:     Conjunctiva/sclera: Conjunctivae normal.  Cardiovascular:     Rate and Rhythm: Normal rate and regular rhythm.  Pulmonary:     Effort: Pulmonary effort is normal.  Abdominal:     Palpations: Abdomen is soft.  Musculoskeletal:     Cervical back: Neck supple.     Comments: 5 out of 5 strength to all 4 extremities  Skin:    Findings: No rash.  Neurological:     Mental Status: She is alert and oriented to person, place, and time.  Psychiatric:  Mood and Affect: Mood normal.     ED Results / Procedures / Treatments   Labs (all labs ordered are listed, but only abnormal results are displayed) Labs Reviewed  CBC WITH DIFFERENTIAL/PLATELET - Abnormal; Notable for the following components:      Result Value   Neutro Abs 1.6 (*)    All other components within normal limits  BASIC METABOLIC PANEL WITH GFR - Abnormal; Notable for the following components:   Glucose, Bld 102 (*)    All other components within normal limits  CK - Abnormal; Notable for the following components:   Total CK 284 (*)    All other components within normal limits  PHOSPHORUS  MAGNESIUM  HEPATIC FUNCTION PANEL  TSH  URINALYSIS, ROUTINE W REFLEX MICROSCOPIC     EKG None ED ECG REPORT   Date: 02/07/2024  Rate: 50  Rhythm: sinus bradycardia  QRS Axis: normal  Intervals: normal  ST/T Wave abnormalities:  borderline T abnormalities, anterior leads  Conduction Disutrbances:none  Narrative Interpretation:   Old EKG Reviewed: unchanged  I have personally reviewed the EKG tracing and agree with the computerized printout as noted.   Radiology No results found.  Procedures Procedures    Medications Ordered in ED Medications - No data to display  ED Course/ Medical Decision Making/ A&P                                 Medical Decision Making Amount and/or Complexity of Data Reviewed Labs: ordered. ECG/medicine tests: ordered.   BP (!) 192/93 (BP Location: Right Arm)   Pulse 61   Temp 98 F (36.7 C)   Resp 16   SpO2 100%   7:10 AM Kasie is a 63 yo female with no significant cardiac history, presenting to the ER today for evaluation of generalized body aches and "heaviness". Pt states that she first began to experience increased extremity heaviness as well as generalized body aches around 3/26. She went to UC to be evaluated and the provider believed that this was MSK related d/t the patient stating that she exercises daily and had recently done weight training. She has also noticed that her BP fluctuates from high to low and she experiences more extremity heaviness with elevated Bps. She states she has also seen an increase in varicose veins in BLE but she denies experiencing LE edema. Pt also states that she has noticed a infrequent bowel movements and decrease in appetite since this began as well, with early satiety occurring in the evening. She states that she does not currently take any prescription medications, only OTC vitamins. Currently, she endorse chills, chest tenderness and tightness, lightheadedness and palpitations but denies fever, SOB, cough, headaches, vision changes, or abd pain.  Patient states she notices symptoms  started after she had some cold symptoms in March.  On exam, patient is resting comfortably appears to be in no acute discomfort.  Heart with normal rate and rhythm, lungs clear to auscultation bilaterally abdomen is soft nontender patient has a equal strength throughout all 4 extremities and mentating appropriately.  Vital signs notable for elevated blood pressure of 192/93.  She is afebrile, no hypoxia.  -Labs ordered, independently viewed and interpreted by me.  Labs remarkable for CK 284 -The patient was maintained on a cardiac monitor.  I personally viewed and interpreted the cardiac monitored which showed an underlying rhythm of: sinus bradycardia -Imaging including head CT considered but  not performed -This patient presents to the ED for concern of aches, this involves an extensive number of treatment options, and is a complaint that carries with it a high risk of complications and morbidity.  The differential diagnosis includes rhabdomyolysis, viral illness, thyroid disease, hypertension -Co morbidities that complicate the patient evaluation includes none -Treatment includes monitor -Reevaluation of the patient after these medicines showed that the patient improved -PCP office notes or outside notes reviewed -Escalation to admission/observation considered: patients feels much better, is comfortable with discharge, and will follow up with PCP -Prescription medication considered, patient comfortable with otc meds -Social Determinant of Health considered          Final Clinical Impression(s) / ED Diagnoses Final diagnoses:  Body aches    Rx / DC Orders ED Discharge Orders     None         Fayrene Helper, PA-C 02/07/24 1030    Wynetta Fines, MD 02/07/24 [phone]

## 2024-02-07 NOTE — ED Triage Notes (Addendum)
 Patient reports generalized body and muscle aches allover her body this morning  , denies fever or chills , respirations unlabored. Hypertensive at triage .

## 2024-02-07 NOTE — Discharge Instructions (Signed)
 You have been evaluated for your symptoms.  Your blood work overall did not show any concerning finding.  Initially your blood pressure was elevated however without any intervention it has normalized.  Follow-up with your doctor for blood pressure recheck and for further managements of your condition.  Return if any concern.

## 2024-02-07 NOTE — ED Notes (Signed)
 Pt ambulated to the bathroom was able to give sample

## 2024-02-11 ENCOUNTER — Ambulatory Visit
Admission: EM | Admit: 2024-02-11 | Discharge: 2024-02-11 | Disposition: A | Discharge status: Home / Self Care | Attending: Family Medicine | Admitting: Family Medicine

## 2024-02-11 ENCOUNTER — Ambulatory Visit: Admitting: Obstetrics

## 2024-02-11 DIAGNOSIS — H1132 Conjunctival hemorrhage, left eye: Secondary | ICD-10-CM

## 2024-02-11 NOTE — ED Provider Notes (Signed)
 [facility]    CSN: [id number] Arrival date & time: 02/11/24  1453      History   Chief Complaint No chief complaint on file.   HPI Sabrina Gallagher is a 63 y.o. female presents for eye injury.  Patient reports a week ago she accidentally hit her left eye with a brim of her hat.  States she thought nothing of it and just had like a small red line to the bottom half of her conjunctiva.  Had no pain, visual changes.  Today she felt like there was something in her eye and as she was "messing with it" it formed a slight blister over that area.  Again denies any pain, foreign body sensation, photosensitivity, or visual changes.  She wears 1 contact in her right eye for distance and no correction her left eye for up close vision.  No history of eye surgeries.  No other concerns at this time.  HPI  Past Medical History:  Diagnosis Date   Abnormal EKG    Fatigue    Osteoporosis 11/2017   T score -3.0   Pap smear of cervix with ASCUS, cannot exclude HGSIL 08/2015   colposcopy biopsy 12:00 with LGSIL negative ECC   Premature ovarian failure age 54   SOB (shortness of breath)     Patient Active Problem List   Diagnosis Date Noted   Urinary incontinence, mixed 11/28/2023   Vaginal atrophy 11/28/2023   Constipation 11/28/2023   Pelvic organ prolapse quantification stage 1 cystocele 11/28/2023   History of UTI 11/28/2023   Cervical myofascial pain syndrome 01/29/2023   Paresthesias 04/24/2016   Muscle pain 04/24/2016   Small fiber neuropathy 01/31/2016   B12 deficiency 01/31/2016   Pre-diabetes 01/31/2016   Reflux    Premature ovarian failure    OVARIAN FAILURE, PREMATURE 06/23/2010   HYPERLIPIDEMIA-MIXED 06/23/2010   WEIGHT GAIN 06/23/2010   HEARTBURN 06/23/2010    Past Surgical History:  Procedure Laterality Date   No surgical history      OB History     Gravida  3   Para  2   Term  2   Preterm      AB  1   Living  2      SAB  1   IAB       Ectopic      Multiple      Live Births  2            Home Medications    Prior to Admission medications   Medication Sig Start Date End Date Taking? Authorizing Provider  B Complex Vitamins (B COMPLEX PO) daily.   Yes [provider]  famotidine (PEPCID) 20 MG tablet Take by mouth. 02/09/24 03/10/24 Yes [provider]  Ferrous Sulfate (IRON PO) daily.   Yes [provider]  ibuprofen (ADVIL) 200 MG tablet Take 200 mg by mouth every 6 (six) hours as needed.   Yes [provider]  VITAMIN D PO Take 5,000 Int'l Units by mouth daily.   Yes [provider]  estradiol (ESTRACE VAGINAL) 0.1 MG/GM vaginal cream Place 1 Applicatorful vaginally at bedtime. Rub 1mg  in at bedtime for 3 weeks then use 3 times a week there after 07/24/23   Earley Favor, MD  phenazopyridine (PYRIDIUM) 200 MG tablet Take 1 tablet (200 mg total) by mouth 3 (three) times daily as needed for pain. 11/28/23   Loleta Chance, MD  Trospium Chloride 60 MG CP24 Take  1 capsule (60 mg total) by mouth daily. 12/04/23   Darlene Ehlers, MD    Family History Family History  Problem Relation Age of Onset   Cancer Mother        Oral-tongue   Colon cancer Father    Neurofibromatosis Neg Hx    Bladder Cancer Neg Hx    Uterine cancer Neg Hx     Social History Social History   Tobacco Use   Smoking status: Never   Smokeless tobacco: Never  Vaping Use   Vaping status: Never Used  Substance Use Topics   Alcohol use: Never   Drug use: No     Allergies   Patient has no known allergies.   Review of Systems Review of Systems  Eyes:        Left eye injury      Physical Exam Triage Vital Signs ED Triage Vitals  Encounter Vitals Group     BP 02/11/24 1516 (!) 149/89     Systolic BP Percentile --      Diastolic BP Percentile --      Pulse Rate 02/11/24 1516 60     Resp 02/11/24 1516 16     Temp 02/11/24 1516 98.1 F (36.7 C)     Temp Source 02/11/24 1516 Oral      SpO2 02/11/24 1516 95 %     Weight --      Height --      Head Circumference --      Peak Flow --      Pain Score 02/11/24 1512 4     Pain Loc --      Pain Education --      Exclude from Growth Chart --    No data found.  Updated Vital Signs BP (!) 149/89 (BP Location: Left Arm)   Pulse 60   Temp 98.1 F (36.7 C) (Oral)   Resp 16   SpO2 95%   Visual Acuity Right Eye Distance: 20/40 Left Eye Distance: 20/100 Bilateral Distance: 20/30  Right Eye Near:   Left Eye Near:    Bilateral Near:     Physical Exam Vitals and nursing note reviewed.  Constitutional:      General: She is not in acute distress.    Appearance: Normal appearance. She is not ill-appearing.  HENT:     Head: Normocephalic and atraumatic.  Eyes:     General: Lids are normal.        Left eye: No foreign body, discharge or hordeolum.     Conjunctiva/sclera:     Right eye: Right conjunctiva is not injected. No chemosis, exudate or hemorrhage.    Left eye: Left conjunctiva is not injected. Chemosis and hemorrhage present. No exudate.    Pupils: Pupils are equal, round, and reactive to light.     Left eye: No corneal abrasion or fluorescein uptake.   Cardiovascular:     Rate and Rhythm: Normal rate.  Pulmonary:     Effort: Pulmonary effort is normal.  Skin:    General: Skin is warm and dry.  Neurological:     General: No focal deficit present.     Mental Status: She is alert and oriented to person, place, and time.  Psychiatric:        Mood and Affect: Mood normal.        Behavior: Behavior normal.      Visual Acuity Bilateral Distance: 20/30 R Distance: 20/40 L Distance: 20/100   UC Treatments /  Results  Labs (all labs ordered are listed, but only abnormal results are displayed) Labs Reviewed - No data to display  EKG   Radiology No results found.  Procedures Procedures (including critical care time)  Medications Ordered in UC Medications - No data to display  Initial Impression  / Assessment and Plan / UC Course  I have reviewed the triage vital signs and the nursing notes.  Pertinent labs & imaging results that were available during my care of the patient were reviewed by me and considered in my medical decision making (see chart for details).     Reviewed exam and sx with patient. No red flags.  While left eye visual acuity is poor patient states this is the eye she uses for up close vision and does not wear correction to that eye.  Negative fluorescein uptake.  Patient without any pain or visual changes.  Advised her to follow-up with her ophthalmologist today or tomorrow for further evaluation.  She states she will call them today to make an appointment.  Strict ER precautions reviewed and patient verbalized understanding. Final Clinical Impressions(s) / UC Diagnoses   Final diagnoses:  Subconjunctival hemorrhage of left eye     Discharge Instructions      Please contact your ophthalmologist 2 days to make a follow-up appointment either today or tomorrow for further evaluation of your eye.  Please go to the ER if you develop any worsening symptoms prior to seeing your ophthalmologist.  This includes but is not limited to pain, visual changes, or any new concerns that arise.  Hope you feel better soon!    ED Prescriptions   None    PDMP not reviewed this encounter.   Alleen Arbour, NP 02/11/24 1544

## 2024-02-11 NOTE — ED Triage Notes (Signed)
 Pt presents to UC for c/o left eye injury which occurred 1 week ago. Pt states she hit her left eye with the brim of her hat which made a small red line. Pt reports today it turned into a red painful bump.

## 2024-02-11 NOTE — Discharge Instructions (Signed)
 Please contact your ophthalmologist 2 days to make a follow-up appointment either today or tomorrow for further evaluation of your eye.  Please go to the ER if you develop any worsening symptoms prior to seeing your ophthalmologist.  This includes but is not limited to pain, visual changes, or any new concerns that arise.  Hope you feel better soon!

## 2024-02-18 ENCOUNTER — Encounter: Payer: Self-pay | Admitting: Family Medicine

## 2024-02-18 ENCOUNTER — Ambulatory Visit: Admitting: Family Medicine

## 2024-02-18 VITALS — BP 144/86 | HR 79 | Temp 97.8°F | Ht 64.0 in | Wt 165.2 lb

## 2024-02-18 DIAGNOSIS — H1132 Conjunctival hemorrhage, left eye: Secondary | ICD-10-CM

## 2024-02-18 DIAGNOSIS — K8020 Calculus of gallbladder without cholecystitis without obstruction: Secondary | ICD-10-CM

## 2024-02-18 DIAGNOSIS — K219 Gastro-esophageal reflux disease without esophagitis: Secondary | ICD-10-CM | POA: Diagnosis not present

## 2024-02-18 DIAGNOSIS — M79602 Pain in left arm: Secondary | ICD-10-CM

## 2024-02-18 DIAGNOSIS — M8580 Other specified disorders of bone density and structure, unspecified site: Secondary | ICD-10-CM

## 2024-02-18 DIAGNOSIS — R001 Bradycardia, unspecified: Secondary | ICD-10-CM

## 2024-02-18 DIAGNOSIS — Z780 Asymptomatic menopausal state: Secondary | ICD-10-CM

## 2024-02-18 NOTE — Progress Notes (Signed)
 Assessment/Plan:   Assessment & Plan Wellness Visit Establishing care with a new primary care provider due to dissatisfaction with previous care. Overall in good health with recent lifestyle changes including a plant-based diet and weight loss of over 30 pounds. Engaged in regular physical activity including walking and strength training. Recent health concerns include acid reflux, gallstones, musculoskeletal strain, sinus bradycardia, and subconjunctival hemorrhage. - Continue plant-based diet and regular physical activity as tolerated - Follow up with cardiology, general surgery, and gastroenterology as scheduled  Elevated blood pressure Blood pressure is mildly elevated at 144/86 mmHg. Goal is to maintain blood pressure under 140/90 mmHg. Previous readings also mildly elevated, but taken in new environments. - Recheck blood pressure at future visits - Discuss potential need for antihypertensive medication if blood pressure remains elevated  Acid reflux Experiencing acid reflux, currently managed with famotidine  20 mg twice daily. Reports improvement in symptoms with current regimen. No previous use of stronger medications like omeprazole or pantoprazole . Gastroenterology follow-up planned for further evaluation. - Continue famotidine  20 mg twice daily - Follow up with gastroenterologist as scheduled  Gallstones Presence of a 1 cm gallstone with associated right-sided chest pain. Currently managed with a low-fat diet and famotidine , which has helped alleviate symptoms. Undergoing evaluation by general surgery. - Continue low-fat diet - Follow up with general surgery as scheduled  Musculoskeletal strain Musculoskeletal strain in the arms related to recent strength training. Advised to avoid weight lifting temporarily but continue other forms of exercise like walking. Symptoms have improved with reduced activity. - Avoid weight lifting until symptoms resolve - Continue walking and other  non-straining exercises  Sinus bradycardia Sinus bradycardia with heart rate occasionally dropping below 60 bpm. No associated symptoms like shortness of breath. Under evaluation by cardiology with a scheduled appointment. - Follow up with cardiology as scheduled  Subconjunctival hemorrhage Recent subconjunctival hemorrhage with no history of trauma. Evaluated by an optometrist who confirmed no corneal injury. Symptoms have improved with time. - Use lubricating eye drops as needed for irritation      Medications Discontinued During This Encounter  Medication Reason   Trospium  Chloride 60 MG CP24    phenazopyridine  (PYRIDIUM ) 200 MG tablet     No follow-ups on file.    Subjective:   Encounter date: 02/18/2024  Sabrina Gallagher is a 63 y.o. female who has OVARIAN FAILURE, PREMATURE; HYPERLIPIDEMIA-MIXED; WEIGHT GAIN; HEARTBURN; Premature ovarian failure; Reflux; Small fiber neuropathy; B12 deficiency; Pre-diabetes; Paresthesias; Muscle pain; Cervical myofascial pain syndrome; Urinary incontinence, mixed; Vaginal atrophy; Constipation; Pelvic organ prolapse quantification stage 1 cystocele; and History of UTI on their problem list..   She  has a past medical history of Abnormal EKG, Fatigue, GERD (gastroesophageal reflux disease) (2020), Osteoporosis (11/2017), Pap smear of cervix with ASCUS, cannot exclude HGSIL (08/2015), Premature ovarian failure (age 41), and SOB (shortness of breath)..   She presents with chief complaint of Establish Care (Urgent Care 3/26 and 4/10 - Shoulder/Arm/Chest pain at night , tingling in arms/hands preformed a ekg. [facility] found a gallstone present. Req records from [facility]) .   Discussed the use of AI scribe software for clinical note transcription with the patient, who gave verbal consent to proceed.  History of Present Illness Sabrina Gallagher is a 63 year old female who presents with chest pain and gallstones.  She experiences  right-sided chest pain, described as a dull ache, which began after starting strength training. Initially thought to be musculoskeletal, the pain persisted, prompting further evaluation. Her  heart rate has been below 60 beats per minute for several months, but she has not experienced shortness of breath. She has been diagnosed with sinus bradycardia and a nonspecific T wave abnormality and is under the care of a cardiologist at [facility].  She has symptoms of acid reflux, managed effectively with Pepcid  (famotidine ) 20 mg twice daily. She follows a low-fat diet to manage her gallbladder issues, which include a 1 cm gallstone. She has an upcoming appointment with a general surgery team for further evaluation.  She has a history of a small polyp found in her esophagus during an endoscopy, which was removed and found to be benign. She is scheduled to follow up with her gastroenterologist later this year.  She experienced a subconjunctival hemorrhage in her eye, which was evaluated by her optometrist and determined to be non-serious. She was given eye drops for irritation.  She has a history of elevated ferritin levels, noted to be 443, which was outside the normal range. This was taken during a stressful period and may indicate inflammation.  She recently transitioned to a plant-based diet, resulting in a weight loss of over 30 pounds. She is retired and maintains an active lifestyle, including walking daily and spending time with her family.  The ASCVD Risk score (Arnett DK, et al., 2019) failed to calculate for the following reasons:   Cannot find a previous HDL lab   Cannot find a previous total cholesterol lab    ROS  Past Surgical History:  Procedure Laterality Date   No surgical history     TUBAL LIGATION  1985    Outpatient Medications Prior to Visit  Medication Sig Dispense Refill   B Complex Vitamins (B COMPLEX PO) daily.     estradiol  (ESTRACE  VAGINAL) 0.1 MG/GM vaginal cream Place 1  Applicatorful vaginally at bedtime. Rub 1mg  in at bedtime for 3 weeks then use 3 times a week there after 42.5 g 12   famotidine  (PEPCID ) 20 MG tablet Take by mouth.     Ferrous Sulfate (IRON PO) daily.     ibuprofen (ADVIL) 200 MG tablet Take 200 mg by mouth every 6 (six) hours as needed.     VITAMIN D  PO Take 5,000 Int'l Units by mouth daily.     phenazopyridine  (PYRIDIUM ) 200 MG tablet Take 1 tablet (200 mg total) by mouth 3 (three) times daily as needed for pain. (Patient not taking: Reported on 02/18/2024) 10 tablet 0   Trospium  Chloride 60 MG CP24 Take 1 capsule (60 mg total) by mouth daily. (Patient not taking: Reported on 02/18/2024) 30 capsule 2   No facility-administered medications prior to visit.    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  Tobacco Use   Smoking status: Never   Smokeless tobacco: Never  Vaping Use   Vaping status: Never Used  Substance and Sexual Activity   Alcohol use: Never  Drug use: No   Sexual activity: Yes    Partners: Male    Birth control/protection: Post-menopausal    Comment: 1st intercourse 63 yo-Fewer than 5 partners  Other Topics Concern   Not on file  Social History Narrative   Right handed   Caffeine use: rare    Social Drivers of Health   Financial Resource Strain: Low Risk  (02/13/2024)   Overall Financial Resource Strain (CARDIA)    Difficulty of Paying Living Expenses: Not very hard  Food Insecurity: No Food Insecurity (02/13/2024)   Hunger Vital Sign    Worried  About Running Out of Food in the Last Year: Never true    Ran Out of Food in the Last Year: Never true  Transportation Needs: No Transportation Needs (02/13/2024)   PRAPARE - Administrator, Civil Service (Medical): No    Lack of Transportation (Non-Medical): No  Physical Activity: Sufficiently Active (02/13/2024)   Exercise Vital Sign    Days of Exercise per Week: 5 days    Minutes of Exercise per Session: 40 min  Stress: No Stress Concern Present (02/13/2024)   [employer] of Occupational Health - Occupational Stress Questionnaire    Feeling of Stress : Only a little  Social Connections: Socially Integrated (02/13/2024)   Social Connection and Isolation Panel [NHANES]    Frequency of Communication with Friends and Family: More than three times a week    Frequency of Social Gatherings with Friends and Family: Once a week    Attends Religious Services: More than 4 times per year    Active Member of [employer] or Organizations: Yes    Attends Engineer, structural: More than 4 times per year    Marital Status: Married  Catering manager Violence: Not on file                                                                                                  Objective:  Physical Exam: BP (!) 144/86   Pulse 79   Temp 97.8 F (36.6 C) (Temporal)   Ht 5\' 4"  (1.626 m)   Wt 165 lb 3.2 oz (74.9 kg)   SpO2 98%   BMI 28.36 kg/m      Physical Exam Constitutional:      General: She is not in acute distress.    Appearance: Normal appearance. She is not ill-appearing or toxic-appearing.  HENT:     Head: Normocephalic and atraumatic.     Nose: Nose normal. No congestion.  Eyes:     General: No scleral icterus.    Extraocular Movements: Extraocular movements intact.  Cardiovascular:     Rate and Rhythm: Normal rate and regular rhythm.     Pulses: Normal pulses.     Heart sounds: Normal heart sounds.  Pulmonary:     Effort: Pulmonary effort is normal. No respiratory  distress.     Breath sounds: Normal breath sounds.  Abdominal:     General: Abdomen is flat. Bowel sounds are normal.     Palpations: Abdomen is soft.  Musculoskeletal:  General: Normal range of motion.  Lymphadenopathy:     Cervical: No cervical adenopathy.  Skin:    General: Skin is warm and dry.     Findings: No rash.  Neurological:     General: No focal deficit present.     Mental Status: She is alert and oriented to person, place, and time. Mental status is at baseline.  Psychiatric:        Mood and Affect: Mood normal.        Behavior: Behavior normal.        Thought Content: Thought content normal.        Judgment: Judgment normal.       Comprehensive Metabolic Panel (CMP) Specimen: Blood Component Ref Range & Units 6 d ago Comments  Sodium 135 - 145 mmol/L 140   Potassium 3.5 - 5.0 mmol/L 4   Chloride 98 - 108 mmol/L 105   Carbon Dioxide (CO2) 21 - 30 mmol/L 28   Urea Nitrogen (BUN) 7 - 20 mg/dL 7   Creatinine 0.4 - 1.0 mg/dL 0.9   Glucose 70 - 161 mg/dL 82 Interpretive Data: Above is the NONFASTING reference range.  Below are the FASTING reference ranges: NORMAL:      70-99 mg/dL PREDIABETES: 096-045 mg/dL DIABETES:    > 409 mg/dL  Calcium 8.7 - 81.1 mg/dL 9.6   AST (Aspartate Aminotransferase) 15 - 41 U/L 20   ALT (Alanine Aminotransferase) 10 - 39 U/L 15   Bilirubin, Total 0.4 - 1.5 mg/dL 0.7   Alk Phos (Alkaline Phosphatase) 24 - 110 U/L 51   Albumin 3.5 - 4.8 g/dL 4.2   Protein, Total 6.2 - 8.1 g/dL 7.2   Anion Gap 3 - 12 mmol/L 7   BUN/CREA Ratio 6 - 27 8   Glomerular Filtration Rate (eGFR) mL/min/1.73sq m 73 CKD-EPI (2021) does not include patient's race in the calculation of eGFR. Monitoring changes of plasma creatinine and eGFR over time is useful for monitoring kidney function.  This change was made on 12/28/2020.  Interpretive Ranges for eGFR(CKD-EPI 2021):  eGFR:              > 60 mL/min/1.73 sq m - Normal eGFR:               30 - 59 mL/min/1.73 sq m - Moderately Decreased eGFR:              15 - 29 mL/min/1.73 sq m - Severely Decreased eGFR:              < 15 mL/min/1.73 sq m -  Kidney Failure   Note: These eGFR calculations do not apply in acute situations when eGFR is changing rapidly or in patients on dialysis.  Resulting Agency [facility]   Specimen Collected: 02/12/24 16:25   Performed by: [facility] Last Resulted: 02/12/24 18:38  Received From: [facility]  Result Received: 02/18/24 14:39   View Encounter   Recent Data from [facility] Related to Comprehensive Metabolic Panel (CMP) 02/09/24 91/47/82 Component  141 140 Sodium  3.7 4 Potassium  105 105 Chloride  27 28 Carbon Dioxide (CO2)  7 7 Urea Nitrogen (BUN)  0.7 0.9 Creatinine  81  82  Glucose  9.5 9.6 Calcium  23 20 AST (Aspartate Aminotransferase)  17 15 ALT (Alanine Aminotransferase)  0.6 0.7 Bilirubin, Total  54 51 Alk Phos (Alkaline Phosphatase)  4.1 4.2 Albumin  7.4 7.2 Protein, Total  9 7 Anion  Gap  10 8 BUN/CREA Ratio  98  73  Glomerular Filtration Rate (eGFR)   Troponin I, High Sensitivity One Hour (hsTnI) Specimen: Blood - Blood vessel structure (body structure) Component Ref Range & Units 9 d ago Comments  hsTnI 1 hr ng/L <3   Delta from baseline  Unable to calculate  hsTnI Interpretation Rule-Out, Indeterminate Rule-Out   Resulting Agency [facility]   Specimen Collected: 02/09/24 18:50   Performed by: [facility] Last Resulted: 02/09/24 19:53  Received From: [facility]  Result Received: 02/13/24 12:36   View Encounter   Recent Data from [facility] Related to Troponin I, High Sensitivity One Hour (hsTnI) 02/09/24 02/09/24 Component  -- <3 hsTnI 1 hr  -- --  Delta from baseline  Rule-Out Rule-Out hsTnI Interpretation   Thyroid  Profile (TSH and T4 Free) Specimen:  Blood Component Ref Range & Units 9 d ago  Thyroid  Stimulating Hormone (TSH) 0.34 - 5.66 IU/mL 1.79  Thyroxine, Free (FT4) 0.52 - 1.21 ng/dL 0.8  Resulting Agency [facility]  Specimen Collected: 02/09/24 18:50   Performed by: [facility] Last Resulted: 02/09/24 20:41  Received From: [facility]  Result Received: 02/11/24 14:54   View Encounter    Contains abnormal data Ferritin Specimen: Blood Component Ref Range & Units 9 d ago  Ferritin 11 - 204 ng/mL 443 High   Resulting Agency [facility]  Specimen Collected: 02/09/24 18:50   Performed by: [facility] Last Resulted: 02/09/24 20:41  Received From: [facility]  Result Received: 02/18/24 14:39   View Encounter   Iron and Total Iron Binding Capacity (TIBC) Specimen: Blood Component Ref Range & Units 9 d ago Comments  Iron  SPECIMEN GROSSLY HEMOLYZED. UNABLE TO REPORT DUE TO INTERFERENCE CAUSED BY THIS DEGREE OF HEMOLYSIS.  Total Iron Binding Capacity (TIBC) 261 - 478 g/dL 096   Resulting Agency [facility]   Specimen Collected: 02/09/24 18:50   Performed by: [facility] Last Resulted: 02/09/24 20:07  Received From: [facility]  Result Received: 02/18/24 14:39   View Encounter   Creatine Kinase (CK), Total Specimen: Blood Component Ref Range & Units 9 d ago Comments  CK, Total (Creatine Kinase, Total) 30 - 220 U/L 157 SPECIMEN HEMOLYZED. INTERFERENCE CAUSED BY THIS DEGREE OF HEMOLYSIS MAY AFFECT THE FINAL RESULT. INTERPRET RESULT WITH CAUTION.  Resulting Agency [facility]   Specimen Collected: 02/09/24 18:50   Performed by: [facility] Last Resulted: 02/09/24 19:48  Received From: [facility]  Result Received: 02/18/24 14:39   View Encounter   Vitamin B12 Specimen:  Blood Component Ref Range & Units 9 d ago  Vitamin B12 180 - 914 pg/mL 642  Resulting Agency [facility]  Specimen Collected: 02/09/24 18:50   Performed by: [facility] Last Resulted: 02/09/24 20:41  Received From: [facility]  Result Received: 02/18/24 14:39   View Encounter   Folate Specimen: Blood Component Ref Range & Units 9 d ago  Folate (Folic Acid) >6.5 ng/mL >22.0  Resulting Agency [facility]  Specimen Collected: 02/09/24 18:50   Performed by: [facility] Last Resulted: 02/09/24 20:41  Received From: [facility]  Result Received: 02/18/24 14:39   View Encounter   Comprehensive Metabolic Panel (CMP) Specimen: Blood Component Ref Range &  Units 9 d ago Comments  Sodium 135 - 145 mmol/L 141   Potassium 3.5 - 5.0 mmol/L 3.7   Chloride 98 - 108 mmol/L 105   Carbon Dioxide (CO2) 21 - 30 mmol/L 27   Urea Nitrogen (BUN) 7 - 20 mg/dL 7   Creatinine 0.4 - 1.0 mg/dL 0.7   Glucose 70 - 147 mg/dL 81 Interpretive Data: Above is the NONFASTING reference range.  Below are the FASTING reference ranges: NORMAL:      70-99 mg/dL PREDIABETES: 829-562 mg/dL DIABETES:    > 130 mg/dL  Calcium 8.7 - 86.5 mg/dL 9.5   AST (Aspartate Aminotransferase) 15 - 41 U/L 23   ALT (Alanine Aminotransferase) 10 - 39 U/L 17   Bilirubin, Total 0.4 - 1.5 mg/dL 0.6   Alk Phos (Alkaline Phosphatase) 24 - 110 U/L 54   Albumin 3.5 - 4.8 g/dL 4.1   Protein, Total 6.2 - 8.1 g/dL 7.4   Anion Gap 3 - 12 mmol/L 9   BUN/CREA Ratio 6 - 27 10   Glomerular Filtration Rate (eGFR) mL/min/1.73sq m 98 CKD-EPI (2021) does not include patient's race in the calculation of eGFR. Monitoring changes of plasma creatinine and eGFR over time is useful for monitoring kidney function.  This change was made on 12/28/2020.  Interpretive Ranges for eGFR(CKD-EPI 2021):  eGFR:              > 60  mL/min/1.73 sq m - Normal eGFR:              30 - 59 mL/min/1.73 sq m - Moderately Decreased eGFR:              15 - 29 mL/min/1.73 sq m - Severely Decreased eGFR:              < 15 mL/min/1.73 sq m -  Kidney Failure   Note: These eGFR calculations do not apply in acute situations when eGFR is changing rapidly or in patients on dialysis.  Resulting Agency [facility]   Specimen Collected: 02/09/24 17:29   Performed by: [facility] Last Resulted: 02/09/24 18:02  Received From: [facility]  Result Received: 02/13/24 12:36   View Encounter   Recent Data from [facility] Related to Comprehensive Metabolic Panel (CMP) 02/09/24 78/46/96 Component  141 140 Sodium  3.7 4 Potassium  105 105 Chloride  27 28 Carbon Dioxide (CO2)  7 7 Urea Nitrogen (BUN)  0.7 0.9 Creatinine  81  82  Glucose  9.5 9.6 Calcium  23 20 AST (Aspartate Aminotransferase)  17 15 ALT (Alanine Aminotransferase)  0.6 0.7 Bilirubin, Total  54 51 Alk Phos (Alkaline Phosphatase)  4.1 4.2 Albumin  7.4 7.2 Protein, Total  9 7 Anion Gap  10 8 BUN/CREA Ratio  98  73  Glomerular Filtration Rate (eGFR)   Lipase Specimen: Blood Component Ref Range & Units 9 d ago  Lipase 17 - 51 U/L 34  Resulting Agency [facility]  Specimen Collected: 02/09/24 17:29   Performed by: [facility] Last Resulted: 02/09/24 18:02  Received From: [facility]  Result Received: 02/18/24 14:39   View Encounter   Magnesium  Specimen: Blood Component Ref Range & Units 9 d ago  Magnesium  1.8 - 2.5 mg/dL 2  Resulting Agency [facility]  Specimen Collected: 02/09/24 17:29   Performed by: [facility] Last Resulted: 02/09/24 18:02  Received From: [facility]  Result Received: 02/18/24 14:39   View Encounter   Troponin I, High Sensitivity ED Evaluation  (hsTnI) Specimen: Blood - Blood vessel structure (body structure) Component Ref Range & Units 9 d ago  hsTnI Baseline ng/L <3  hsTnI Interpretation Rule-Out, Indeterminate Rule-Out  Symptom Duration >=3 hours  Resulting Agency [facility]  Specimen Collected: 02/09/24 17:29   Performed by: [facility] Last Resulted: 02/09/24 18:07  Received From: [facility]  Result Received: 02/18/24 14:39   View Encounter   Recent Data from [facility] Related to Troponin I, High Sensitivity ED Evaluation (hsTnI) 02/09/24 02/09/24 Component  <3 -- hsTnI Baseline  Rule-Out Rule-Out hsTnI Interpretation  >=3 hours -- Symptom Duration   Complete Blood Count (CBC) with Differential Specimen: Blood Component Ref Range & Units 9 d ago Comments  WBC (White Blood Cell Count) 3.2 - 9.8 x10^9/L 5.1   Hemoglobin 11.7 - 15.5 g/dL 16.1   Hematocrit 09.6 - 45.0 % 39.8   Platelets 150 - 450 x10^9/L 248   MCV (Mean Corpuscular Volume) 80 - 98 fL 91   MCH (Mean Corpuscular Hemoglobin) 26.5 - 34.0 pg 29.3   MCHC (Mean Corpuscular Hemoglobin Concentration) 31.0 - 36.0 % 32.4   RBC (Red Blood Cell Count) 3.77 - 5.16 x10^12/L 4.4   RDW-CV (Red Cell Distribution Width) 11.5 - 14.5 % 12.8   NRBC (Nucleated Red Blood Cell Count) 0 x10^9/L 0   NRBC % (Nucleated Red Blood Cell %) % 0   MPV (Mean Platelet Volume) 7.2 - 11.7 fL 10.8   Neutrophil Count 2.0 - 8.6 x10^9/L 2   Neutrophil % 37 - 80 % 39.1   Lymphocyte Count 0.6 - 4.2 x10^9/L 2.4   Lymphocyte % 10 - 50 % 47.3   Monocyte Count 0 - 0.9 x10^9/L 0.6   Monocyte % 0 - 12 % 11   Eosinophil Count 0 - 0.70 x10^9/L 0.07   Eosinophil % 0 - 7 % 1.4   Basophil Count 0 - 0.20 x10^9/L 0.05   Basophil % 0 - 2 % 1   Slide Review/Morphology Yes Blood film reviewed, instrument counts confirmed,  Immature Granulocyte Count <=0.06 x10^9/L 0.01   Immature Granulocyte  % <=0.7 % 0.2   Resulting Agency [facility]   Specimen Collected: 02/09/24 17:29   Performed by: [facility] Last Resulted: 02/09/24 18:12  Received From: [facility]  Result Received: 02/11/24 14:54   View Encounter    No results found.  Recent Results (from the past 2160 hours)  POCT Urinalysis Dipstick     Status: None   Collection Time: 11/28/23  3:29 PM  Result Value Ref Range   Color, UA yellow    Clarity, UA Clear    Glucose, UA Negative Negative   Bilirubin, UA Negative    Ketones, UA Negative    Spec Grav, UA 1.020 1.010 - 1.025   Blood, UA Negative    pH, UA 5.5 5.0 - 8.0   Protein, UA Negative Negative   Urobilinogen, UA 0.2 0.2 or 1.0 E.U./dL   Nitrite, UA Negative    Leukocytes, UA Negative Negative   Appearance     Odor    CBC with Differential     Status: Abnormal   Collection Time: 02/07/24  5:18 AM  Result Value Ref Range   WBC 4.6 4.0 - 10.5 K/uL   RBC 4.46 3.87 - 5.11 MIL/uL  Hemoglobin 13.3 12.0 - 15.0 g/dL   HCT 16.1 09.6 - 04.5 %   MCV 91.0 80.0 - 100.0 fL   MCH 29.8 26.0 - 34.0 pg   MCHC 32.8 30.0 - 36.0 g/dL   RDW 40.9 81.1 - 91.4 %   Platelets 220 150 - 400 K/uL   nRBC 0.0 0.0 - 0.2 %   Neutrophils Relative % 36 %   Neutro Abs 1.6 (L) 1.7 - 7.7 K/uL   Lymphocytes Relative 52 %   Lymphs Abs 2.4 0.7 - 4.0 K/uL   Monocytes Relative 10 %   Monocytes Absolute 0.4 0.1 - 1.0 K/uL   Eosinophils Relative 1 %   Eosinophils Absolute 0.1 0.0 - 0.5 K/uL   Basophils Relative 1 %   Basophils Absolute 0.1 0.0 - 0.1 K/uL   Immature Granulocytes 0 %   Abs Immature Granulocytes 0.00 0.00 - 0.07 K/uL    Comment: Performed at [facility], [address], [place], [place] [address]  Basic metabolic panel     Status: Abnormal   Collection Time: 02/07/24  5:18 AM  Result Value Ref Range   Sodium 138 135 - 145 mmol/L   Potassium 3.7 3.5 - 5.1 mmol/L   Chloride 103 98 - 111 mmol/L   CO2 24  22 - 32 mmol/L   Glucose, Bld 102 (H) 70 - 99 mg/dL    Comment: Glucose reference range applies only to samples taken after fasting for at least 8 hours.   BUN 10 8 - 23 mg/dL   Creatinine, Ser 6.21 0.44 - 1.00 mg/dL   Calcium 9.6 8.9 - 30.8 mg/dL   GFR, Estimated >65 >78 mL/min    Comment: (NOTE) Calculated using the CKD-EPI Creatinine Equation (2021)    Anion gap 11 5 - 15    Comment: Performed at [facility], [address] [place], [place] [address]  Urinalysis, Routine w reflex microscopic -Urine, Clean Catch     Status: None   Collection Time: 02/07/24  6:51 AM  Result Value Ref Range   Color, Urine YELLOW YELLOW   APPearance CLEAR CLEAR   Specific Gravity, Urine 1.009 1.005 - 1.030   pH 6.0 5.0 - 8.0   Glucose, UA NEGATIVE NEGATIVE mg/dL   Hgb urine dipstick NEGATIVE NEGATIVE   Bilirubin Urine NEGATIVE NEGATIVE   Ketones, ur NEGATIVE NEGATIVE mg/dL   Protein, ur NEGATIVE NEGATIVE mg/dL   Nitrite NEGATIVE NEGATIVE   Leukocytes,Ua NEGATIVE NEGATIVE    Comment: Performed at [facility] Lab, [address] [place], [place] [address]  CK     Status: Abnormal   Collection Time: 02/07/24  7:33 AM  Result Value Ref Range   Total CK 284 (H) 38 - 234 U/L    Comment: Performed at [facility], [address] [place], [place] [address]  Phosphorus     Status: None   Collection Time: 02/07/24  7:33 AM  Result Value Ref Range   Phosphorus 3.8 2.5 - 4.6 mg/dL    Comment: Performed at [facility] Lab, [address] [place], [place] [address]  Magnesium      Status: None   Collection Time: 02/07/24  7:33 AM  Result Value Ref Range   Magnesium  2.1 1.7 - 2.4 mg/dL    Comment: Performed at [facility] Lab, [address], [place], [place] [address]  Hepatic function panel     Status: None   Collection Time: 02/07/24  7:33  AM  Result Value Ref Range   Total Protein 7.5 6.5 - 8.1 g/dL   Albumin 4.2 3.5 - 5.0 g/dL   AST 28 15 - 41 U/L   ALT 13 0 - 44 U/L    Alkaline Phosphatase 44 38 - 126 U/L   Total Bilirubin 0.5 0.0 - 1.2 mg/dL   Bilirubin, Direct 0.2 0.0 - 0.2 mg/dL   Indirect Bilirubin 0.3 0.3 - 0.9 mg/dL    Comment: Performed at [facility] Lab, [address], [place], [place] [address]  TSH     Status: None   Collection Time: 02/07/24  7:33 AM  Result Value Ref Range   TSH 1.966 0.350 - 4.500 uIU/mL    Comment: Performed by a 3rd Generation assay with a functional sensitivity of <=0.01 uIU/mL. Performed at [facility] Lab, [address], [place], [place] [address]         Carnell Christian, MD, MS

## 2024-04-07 ENCOUNTER — Ambulatory Visit: Admitting: Family Medicine

## 2024-04-08 ENCOUNTER — Ambulatory Visit (INDEPENDENT_AMBULATORY_CARE_PROVIDER_SITE_OTHER): Admitting: Family Medicine

## 2024-04-08 VITALS — BP 110/60 | HR 63 | Temp 97.4°F | Ht 60.75 in | Wt 157.6 lb

## 2024-04-08 DIAGNOSIS — R7989 Other specified abnormal findings of blood chemistry: Secondary | ICD-10-CM | POA: Diagnosis not present

## 2024-04-08 DIAGNOSIS — R634 Abnormal weight loss: Secondary | ICD-10-CM

## 2024-04-08 DIAGNOSIS — M9390 Osteochondropathy, unspecified of unspecified site: Secondary | ICD-10-CM | POA: Diagnosis not present

## 2024-04-08 NOTE — Patient Instructions (Signed)
 Iron deficiency anemia (IDA) can be managed effectively with dietary changes and iron supplementation. The American Gastroenterological Association recommends starting with oral iron supplements, such as ferrous sulfate, ferrous fumarate, or ferrous gluconate, which are cost-effective and generally well-tolerated. To improve iron absorption, take iron supplements on an empty stomach with 80 mg of vitamin C, which enhances absorption by forming a chelate with iron and reducing ferric to ferrous iron. Avoid consuming tea or coffee within an hour of taking iron, as these can inhibit absorption.  Dietary interventions are also crucial. Increase intake of heme iron sources like red meat, poultry, and fish, which are more readily absorbed than nonheme iron found in plant-based foods. For those following vegetarian or vegan diets, it is important to consume iron-rich foods such as lentils, beans, tofu, and fortified cereals, along with vitamin C-rich foods like citrus fruits, strawberries, and bell peppers to enhance nonheme iron absorption.  Additionally, avoid consuming calcium-rich foods or supplements at the same time as iron-rich meals, as calcium can inhibit iron absorption. Instead, space out the intake of these nutrients throughout the day.

## 2024-04-08 NOTE — Progress Notes (Signed)
 Assessment & Plan   Assessment/Plan:      Assessment and Plan Assessment & Plan Musculoskeletal chest wall discomfort (Osteochondritis)   Intermittent right lower chest discomfort is likely musculoskeletal, possibly exacerbated by exercise. Previous evaluations, including ultrasounds, HIDA scan, and surgical consultation, ruled out gallbladder and cardiac causes. The surgeon suggested a musculoskeletal origin, possibly osteochondritis. She manages symptoms with lifestyle modifications and lymphatic drainage exercises, which have been beneficial. Treatment includes physical therapy and analgesics like ibuprofen. Consider referral to orthopedics or sports medicine if symptoms persist. Recommend over-the-counter analgesics like ibuprofen as needed and encourage continuation of lifestyle modifications and lymphatic drainage exercises.  Elevated ferritin levels   Ferritin level is elevated at 443 ng/mL. She was advised to stop iron supplements for 3-6 months due to high ferritin levels and potential gastrointestinal side effects such as constipation. No anemia is noted. Dietary management is recommended, focusing on non-red meat sources of iron. Continue to hold iron supplements for 3-6 months and focus on dietary sources of iron, particularly from greens and other non-red meat sources. Provide information on dietary sources of iron.  General Health Maintenance   She has successfully lost weight, reducing from over 190 lbs to 157 lbs, and engages in regular physical activity. Discussed potential benefits of moderate wine consumption for cardiovascular and brain health, with caution regarding heartburn. Moderate wine consumption is associated with longer life expectancy compared to non-drinkers or heavy drinkers. Encourage continuation of current diet and exercise regimen. Discuss potential benefits of moderate red wine consumption, with a limit of no more than 7 glasses per week, while considering  heartburn symptoms.      Medications Discontinued During This Encounter  Medication Reason   ibuprofen (ADVIL) 200 MG tablet     No follow-ups on file.        Subjective:   Encounter date: 04/08/2024  Sabrina Gallagher is a 63 y.o. female who has OVARIAN FAILURE, PREMATURE; HYPERLIPIDEMIA-MIXED; WEIGHT GAIN; HEARTBURN; Premature ovarian failure; Reflux; Small fiber neuropathy; B12 deficiency; Pre-diabetes; Paresthesias; Muscle pain; Cervical myofascial pain syndrome; Urinary incontinence, mixed; Vaginal atrophy; Constipation; Pelvic organ prolapse quantification stage 1 cystocele; and History of UTI on their problem list..   She  has a past medical history of Abnormal EKG, Fatigue, GERD (gastroesophageal reflux disease) (2020), Osteoporosis (11/2017), Pap smear of cervix with ASCUS, cannot exclude HGSIL (08/2015), Premature ovarian failure (age 50), and SOB (shortness of breath)..   She presents with chief complaint of Heaviness in legs (Pt reports this occurs intermittently. She has started to practice "lymphodema flushes," an exercise practice that she learned about. No longer having tightness in chest since implementing a low-fat diet. ) .   Discussed the use of AI scribe software for clinical note transcription with the patient, who gave verbal consent to proceed.  History of Present Illness Sabrina Gallagher is a 63 year old female who presents with chest discomfort and musculoskeletal pain.  She experiences intermittent right lower chest discomfort, which is described as coming and going. She has undergone extensive workup, including ultrasounds and a HIDA scan, both of which showed normal gallbladder function. She has been engaging in lymphatic drainage exercises for about three weeks, which she feels have improved her symptoms.  She has a history of elevated ferritin levels, with a recent measurement of 443 ng/mL. She stopped taking her iron supplement in April due to high levels  and plans to focus on dietary sources of iron, primarily from chicken, fish, and greens. No anemia has been noted.  She experiences numbness and pain in her legs and feet, particularly after prolonged sitting, which she attributes to a change in her activity level following retirement. She has resumed walking, which has alleviated some of the pain and stiffness.  She has lost significant weight, currently weighing 157 pounds, down from over 190 pounds. She attributes this to dietary changes and increased physical activity.  She mentions a past concern of bradycardia, but a stress test confirmed normal heart function.       Past Surgical History:  Procedure Laterality Date   No surgical history     TUBAL LIGATION  1985    Outpatient Medications Prior to Visit  Medication Sig Dispense Refill   B Complex Vitamins (B COMPLEX PO) daily.     estradiol  (ESTRACE  VAGINAL) 0.1 MG/GM vaginal cream Place 1 Applicatorful vaginally at bedtime. Rub 1mg  in at bedtime for 3 weeks then use 3 times a week there after 42.5 g 12   Ferrous Sulfate (IRON PO) daily.     VITAMIN D  PO Take 5,000 Int'l Units by mouth daily.     ibuprofen (ADVIL) 200 MG tablet Take 200 mg by mouth every 6 (six) hours as needed.     No facility-administered medications prior to visit.    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  Tobacco Use   Smoking status: Never   Smokeless tobacco: Never  Vaping Use   Vaping status: Never Used  Substance and Sexual  Activity   Alcohol use: Never   Drug use: No   Sexual activity: Yes    Partners: Male    Birth control/protection: Post-menopausal    Comment: 1st intercourse 63 yo-Fewer than 5 partners  Other Topics Concern   Not on file  Social History Narrative   Right handed   Caffeine use: rare    Social Drivers of Health   Financial Resource Strain: Low Risk  (02/13/2024)   Overall Financial Resource Strain (CARDIA)    Difficulty of Paying Living Expenses: Not very hard  Food Insecurity: No Food Insecurity (02/13/2024)   Hunger Vital Sign    Worried About Running Out of Food in the Last Year: Never true    Ran Out of Food in the Last Year: Never true  Transportation Needs: No Transportation Needs (02/13/2024)   PRAPARE - Administrator, Civil Service (Medical): No    Lack of Transportation (Non-Medical): No  Physical Activity: Sufficiently Active (02/13/2024)   Exercise Vital Sign    Days of Exercise per Week: 5 days    Minutes of Exercise per Session: 40 min  Stress: No Stress Concern Present (02/13/2024)   [employer] of Occupational Health - Occupational Stress Questionnaire    Feeling of Stress : Only a little  Social Connections: Socially Integrated (02/13/2024)   Social Connection and Isolation Panel [NHANES]    Frequency of Communication with Friends  and Family: More than three times a week    Frequency of Social Gatherings with Friends and Family: Once a week    Attends Religious Services: More than 4 times per year    Active Member of [employer] or Organizations: Yes    Attends Engineer, structural: More than 4 times per year    Marital Status: Married  Catering manager Violence: Not on file                                                                                                  Objective:  Physical Exam: BP 110/60 (BP Location: Left Arm, Patient Position: Sitting)   Pulse 63   Temp (!) 97.4 F (36.3 C) (Temporal)   Ht 5' 0.75" (1.543 m)    Wt 157 lb 9.6 oz (71.5 kg)   SpO2 99%   BMI 30.02 kg/m    Physical Exam  GENERAL: Alert, cooperative, well developed, no acute distress HEENT: Normocephalic, normal oropharynx, moist mucous membranes CHEST: Clear to auscultation bilaterally, No wheezes, rhonchi, or crackles CARDIOVASCULAR: Normal heart rate and rhythm, S1 and S2 normal without murmurs ABDOMEN: Soft, non-tender, non-distended, without organomegaly, Normal bowel sounds EXTREMITIES: No cyanosis or edema NEUROLOGICAL: Cranial nerves grossly intact, Moves all extremities without gross motor or sensory deficit   Physical Exam  No results found.  Recent Results (from the past 2160 hours)  CBC with Differential     Status: Abnormal   Collection Time: 02/07/24  5:18 AM  Result Value Ref Range   WBC 4.6 4.0 - 10.5 K/uL   RBC 4.46 3.87 - 5.11 MIL/uL   Hemoglobin 13.3 12.0 - 15.0 g/dL   HCT 16.1 09.6 - 04.5 %   MCV 91.0 80.0 - 100.0 fL   MCH 29.8 26.0 - 34.0 pg   MCHC 32.8 30.0 - 36.0 g/dL   RDW 40.9 81.1 - 91.4 %   Platelets 220 150 - 400 K/uL   nRBC 0.0 0.0 - 0.2 %   Neutrophils Relative % 36 %   Neutro Abs 1.6 (L) 1.7 - 7.7 K/uL   Lymphocytes Relative 52 %   Lymphs Abs 2.4 0.7 - 4.0 K/uL   Monocytes Relative 10 %   Monocytes Absolute 0.4 0.1 - 1.0 K/uL   Eosinophils Relative 1 %   Eosinophils Absolute 0.1 0.0 - 0.5 K/uL   Basophils Relative 1 %   Basophils Absolute 0.1 0.0 - 0.1 K/uL   Immature Granulocytes 0 %   Abs Immature Granulocytes 0.00 0.00 - 0.07 K/uL    Comment: Performed at [facility] Lab, [address] [place], [place] [address]  Basic metabolic panel     Status: Abnormal   Collection Time: 02/07/24  5:18 AM  Result Value Ref Range   Sodium 138 135 - 145 mmol/L   Potassium 3.7 3.5 - 5.1 mmol/L   Chloride 103 98 - 111 mmol/L   CO2 24 22 - 32 mmol/L   Glucose, Bld 102 (H) 70 - 99 mg/dL    Comment: Glucose reference range applies only to samples taken after fasting for  at least 8 hours.    BUN 10 8 - 23 mg/dL   Creatinine, Ser 1.91 0.44 - 1.00 mg/dL   Calcium 9.6 8.9 - 47.8 mg/dL   GFR, Estimated >29 >56 mL/min    Comment: (NOTE) Calculated using the CKD-EPI Creatinine Equation (2021)    Anion gap 11 5 - 15    Comment: Performed at [facility], [address], [place], [place] [address]  Urinalysis, Routine w reflex microscopic -Urine, Clean Catch     Status: None   Collection Time: 02/07/24  6:51 AM  Result Value Ref Range   Color, Urine YELLOW YELLOW   APPearance CLEAR CLEAR   Specific Gravity, Urine 1.009 1.005 - 1.030   pH 6.0 5.0 - 8.0   Glucose, UA NEGATIVE NEGATIVE mg/dL   Hgb urine dipstick NEGATIVE NEGATIVE   Bilirubin Urine NEGATIVE NEGATIVE   Ketones, ur NEGATIVE NEGATIVE mg/dL   Protein, ur NEGATIVE NEGATIVE mg/dL   Nitrite NEGATIVE NEGATIVE   Leukocytes,Ua NEGATIVE NEGATIVE    Comment: Performed at [facility] Lab, [address] [place], [place] [address]  CK     Status: Abnormal   Collection Time: 02/07/24  7:33 AM  Result Value Ref Range   Total CK 284 (H) 38 - 234 U/L    Comment: Performed at [facility] Lab, [address], [place], [place] [address]  Phosphorus     Status: None   Collection Time: 02/07/24  7:33 AM  Result Value Ref Range   Phosphorus 3.8 2.5 - 4.6 mg/dL    Comment: Performed at [facility] Lab, [address], [place], [place] [address]  Magnesium      Status: None   Collection Time: 02/07/24  7:33 AM  Result Value Ref Range   Magnesium  2.1 1.7 - 2.4 mg/dL    Comment: Performed at [facility] Lab, [address], [place], [place] [address]  Hepatic function panel     Status: None   Collection Time: 02/07/24  7:33 AM  Result Value Ref Range   Total Protein 7.5 6.5 - 8.1 g/dL   Albumin 4.2 3.5 - 5.0 g/dL   AST 28 15 - 41 U/L   ALT 13 0 - 44 U/L   Alkaline Phosphatase 44 38 - 126 U/L   Total Bilirubin 0.5 0.0 - 1.2 mg/dL   Bilirubin, Direct 0.2 0.0 - 0.2 mg/dL   Indirect Bilirubin 0.3 0.3 - 0.9 mg/dL     Comment: Performed at [facility] Lab, [address], [place], [place] [address]  TSH     Status: None   Collection Time: 02/07/24  7:33 AM  Result Value Ref Range   TSH 1.966 0.350 - 4.500 uIU/mL    Comment: Performed by a 3rd Generation assay with a functional sensitivity of <=0.01 uIU/mL. Performed at [facility] Lab, [address] [place], [place] [address]         Carnell Christian, MD, MS

## 2024-04-14 ENCOUNTER — Telehealth: Payer: Self-pay | Admitting: Obstetrics and Gynecology

## 2024-04-14 ENCOUNTER — Ambulatory Visit: Admitting: Nurse Practitioner

## 2024-04-14 DIAGNOSIS — E2839 Other primary ovarian failure: Secondary | ICD-10-CM

## 2024-04-14 NOTE — Telephone Encounter (Signed)
 Dxa scan

## 2024-04-25 ENCOUNTER — Telehealth (HOSPITAL_BASED_OUTPATIENT_CLINIC_OR_DEPARTMENT_OTHER): Payer: Self-pay

## 2024-05-15 ENCOUNTER — Ambulatory Visit (HOSPITAL_BASED_OUTPATIENT_CLINIC_OR_DEPARTMENT_OTHER)
Admission: RE | Admit: 2024-05-15 | Discharge: 2024-05-15 | Disposition: A | Discharge status: Home / Self Care | Source: Ambulatory Visit | Attending: Obstetrics and Gynecology | Admitting: Obstetrics and Gynecology

## 2024-05-15 DIAGNOSIS — E2839 Other primary ovarian failure: Secondary | ICD-10-CM | POA: Diagnosis present

## 2024-05-16 ENCOUNTER — Encounter: Payer: Self-pay | Admitting: Obstetrics and Gynecology

## 2024-05-16 ENCOUNTER — Ambulatory Visit: Payer: Self-pay | Admitting: Obstetrics and Gynecology

## 2024-05-20 ENCOUNTER — Other Ambulatory Visit: Payer: Self-pay | Admitting: Obstetrics and Gynecology

## 2024-05-20 DIAGNOSIS — Z1231 Encounter for screening mammogram for malignant neoplasm of breast: Secondary | ICD-10-CM

## 2024-07-04 ENCOUNTER — Ambulatory Visit
Admission: RE | Admit: 2024-07-04 | Discharge: 2024-07-04 | Disposition: A | Discharge status: Home / Self Care | Source: Ambulatory Visit | Attending: Obstetrics and Gynecology | Admitting: Obstetrics and Gynecology

## 2024-07-04 DIAGNOSIS — Z1231 Encounter for screening mammogram for malignant neoplasm of breast: Secondary | ICD-10-CM

## 2024-07-09 ENCOUNTER — Ambulatory Visit: Payer: Self-pay | Admitting: Obstetrics and Gynecology

## 2024-07-15 ENCOUNTER — Other Ambulatory Visit

## 2024-07-24 ENCOUNTER — Other Ambulatory Visit (HOSPITAL_COMMUNITY)
Admission: RE | Admit: 2024-07-24 | Discharge: 2024-07-24 | Disposition: A | Discharge status: Home / Self Care | Source: Ambulatory Visit | Attending: Obstetrics and Gynecology | Admitting: Obstetrics and Gynecology

## 2024-07-24 ENCOUNTER — Encounter: Payer: Self-pay | Admitting: Obstetrics and Gynecology

## 2024-07-24 ENCOUNTER — Ambulatory Visit (INDEPENDENT_AMBULATORY_CARE_PROVIDER_SITE_OTHER): Admitting: Obstetrics and Gynecology

## 2024-07-24 ENCOUNTER — Ambulatory Visit: Payer: Self-pay | Admitting: Obstetrics and Gynecology

## 2024-07-24 ENCOUNTER — Ambulatory Visit: Admitting: Obstetrics and Gynecology

## 2024-07-24 VITALS — BP 112/68 | HR 64 | Ht 64.5 in | Wt 171.2 lb

## 2024-07-24 DIAGNOSIS — Z1331 Encounter for screening for depression: Secondary | ICD-10-CM

## 2024-07-24 DIAGNOSIS — M810 Age-related osteoporosis without current pathological fracture: Secondary | ICD-10-CM | POA: Diagnosis not present

## 2024-07-24 DIAGNOSIS — Z01419 Encounter for gynecological examination (general) (routine) without abnormal findings: Secondary | ICD-10-CM | POA: Diagnosis present

## 2024-07-24 MED ORDER — ESTRADIOL 0.1 MG/GM VA CREA: TOPICAL_CREAM | VAGINAL | 12 refills | Status: AC

## 2024-07-24 MED ORDER — ESTRADIOL 0.1 MG/GM VA CREA: 1.0000 | TOPICAL_CREAM | Freq: Every day | VAGINAL | 12 refills | Status: DC

## 2024-07-24 NOTE — Progress Notes (Signed)
 63 y.o. y.o. female here for established annual exam. No LMP recorded. Patient is postmenopausal.    She denies any PM bleeding.  Mom may move here from [place]. 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 Patient is postmenopausal.    Pelvic discharge: denies Pelvic pain: denies No h/o HRT use. Using vaginal estrogen three times a week. Last mammogram: 9/25 Last colonoscopy: 02/02/23 DXA 05/15/24 -3.0 spine, no compression symptoms. No fractures. Reports she had osteopenia in her 20's. Declines treatment. Counseled on risk of vertebral compression fracture and s/s. PTH ordered today with labs. Taking calcium and vit D Pelvic discharge: denies Pelvic pain: denies Labs here today. Had RUQ pain this past year and went to [facility]. Only thing found was possible high levels of iron and she stopped taking this. She would like to repeat labs today Body mass index is 28.93 kg/m.     07/24/2024    8:09 AM 04/08/2024    1:04 PM 02/18/2024    3:01 PM  Depression screen PHQ 2/9  Decreased Interest 0 0 0  Down, Depressed, Hopeless 0 0 0  PHQ - 2 Score 0 0 0  Altered sleeping   1  Tired, decreased energy   1  Change in appetite   1  Feeling bad or failure about yourself    0  Trouble concentrating   0  Moving slowly or fidgety/restless   0  Suicidal thoughts   0  PHQ-9 Score   3  Difficult doing work/chores   Not difficult at all    Blood pressure 112/68, pulse 64, height 5' 4.5 (1.638 m), weight 171 lb 3.2 oz (77.7 kg), SpO2 99%.     Component Value Date/Time   DIAGPAP  01/06/2022 1622    - Negative for intraepithelial lesion or malignancy (NILM)   ADEQPAP  01/06/2022 1622    Satisfactory for evaluation; transformation zone component PRESENT.    GYN HISTORY:    Component Value Date/Time   DIAGPAP  01/06/2022 1622    - Negative for intraepithelial lesion or malignancy (NILM)   ADEQPAP  01/06/2022 1622     Satisfactory for evaluation; transformation zone component PRESENT.    OB History  Gravida Para Term Preterm AB Living  3 2 2  1 2   SAB IAB Ectopic Multiple Live Births  1    2    # Outcome Date GA Lbr Len/2nd Weight Sex Type Anes PTL Lv  3 SAB           2 Term     F Vag-Spont   LIV  1 Term     M Vag-Spont   LIV    Past Medical History:  Diagnosis Date   Abnormal EKG    Fatigue    GERD (gastroesophageal reflux disease) 2020   Osteoporosis 11/2017   T score -3.0 needs bone builder. no fractures. considering options   Pap smear of cervix with ASCUS, cannot exclude HGSIL 08/2015   colposcopy biopsy 12:00 with LGSIL negative ECC   Premature ovarian failure age 7   SOB (shortness of breath)     Past Surgical History:  Procedure Laterality Date   No surgical history     TUBAL LIGATION  1985    Current Outpatient Medications on File Prior to Visit  Medication Sig Dispense Refill   B Complex Vitamins (B COMPLEX PO) daily.  estradiol  (ESTRACE  VAGINAL) 0.1 MG/GM vaginal cream Place 1 Applicatorful vaginally at bedtime. Rub 1mg  in at bedtime for 3 weeks then use 3 times a week there after 42.5 g 12   VITAMIN D  PO Take 5,000 Int'l Units by mouth daily.     B Complex Vitamins (VITAMIN B COMPLEX) TABS 1 tablet Orally once a day     Ferrous Sulfate (IRON PO) daily. (Patient not taking: Reported on 07/24/2024)     No current facility-administered medications on file prior to visit.    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  Tobacco Use   Smoking status: Never   Smokeless tobacco: Never  Vaping Use   Vaping status: Never Used  Substance and Sexual Activity   Alcohol use: Yes    Alcohol/week: 1.0 standard drink of alcohol    Types: 1  Glasses of wine per week    Comment: occ   Drug use: No   Sexual activity: Yes    Partners: Male    Birth control/protection: Post-menopausal    Comment: 1st intercourse 63 yo-Fewer than 5 partners  Other Topics Concern   Not on file  Social History Narrative   Right handed   Caffeine use: rare    Social Drivers of Health   Financial Resource Strain: Low Risk  (02/13/2024)   Overall Financial Resource Strain (CARDIA)    Difficulty of Paying Living Expenses: Not very hard  Food Insecurity: No Food Insecurity (02/13/2024)   Hunger Vital Sign    Worried About Running Out of Food in the Last Year: Never true    Ran Out of Food in the Last Year: Never true  Transportation Needs: No Transportation Needs (02/13/2024)   PRAPARE - Administrator, Civil Service (Medical): No    Lack of Transportation (Non-Medical): No  Physical Activity: Sufficiently Active (02/13/2024)   Exercise Vital Sign    Days of Exercise per Week: 5 days    Minutes of Exercise per Session: 40 min  Stress: No Stress Concern Present (02/13/2024)   [employer] of Occupational Health - Occupational Stress Questionnaire    Feeling of Stress : Only a little  Social Connections: Socially Integrated (02/13/2024)   Social Connection and Isolation Panel    Frequency of Communication with Friends and Family: More than three times a week    Frequency of Social Gatherings with Friends and Family: Once a week    Attends Religious Services: More than 4 times per year    Active Member of [employer] or Organizations: Yes    Attends Engineer, structural: More than 4 times per year    Marital Status: Married  Catering manager Violence: Not on file    Family History  Problem Relation Age of Onset   Cancer Mother        Oral-tongue   Varicose Veins Mother    Colon cancer Father    Cancer Father    Neurofibromatosis Neg Hx    Bladder Cancer Neg Hx    Uterine cancer Neg Hx      No Known  Allergies    Patient's last menstrual period was No LMP recorded. Patient is postmenopausal..  Review of Systems Alls systems reviewed and are negative.     OBGyn Exam    A:         Well Woman GYN exam                             P:        Pap smear collected today Encouraged annual mammogram screening Colon cancer screening up-to-date DXA up-to-date Labs and immunizations ordered today Discussed breast self exams Encouraged healthy lifestyle practices Encouraged Vit D and Calcium   No follow-ups on file.  Sabrina Gallagher

## 2024-07-25 LAB — IRON,TIBC AND FERRITIN PANEL
%SAT: 25 % (ref 16–45)
Ferritin: 486 ng/mL — ABNORMAL HIGH (ref 16–288)
Iron: 486 ng/mL — AB (ref 45–288)
TIBC: 288 ug/dL (ref 250–450)

## 2024-07-25 LAB — LIPID PANEL
Cholesterol: 216 mg/dL — ABNORMAL HIGH (ref <200)
HDL: 70 mg/dL (ref >=50)
LDL Cholesterol (Calc): 128 mg/dL — ABNORMAL HIGH
Non-HDL Cholesterol (Calc): 146 mg/dL — ABNORMAL HIGH (ref <130)
Total CHOL/HDL Ratio: 3.1 (calc) (ref <5.0)
Triglycerides: 83 mg/dL (ref <150)

## 2024-07-25 LAB — CBC
HCT: 40.6 % (ref 35.0–45.0)
Hemoglobin: 13 g/dL (ref 11.7–15.5)
MCH: 29 pg (ref 27.0–33.0)
MCHC: 32 g/dL (ref 32.0–36.0)
MCV: 90.6 fL (ref 80.0–100.0)
MPV: 11.2 fL (ref 7.5–12.5)
Platelets: 262 Thousand/uL (ref 140–400)
RBC: 4.48 Million/uL (ref 3.80–5.10)
RDW: 13.3 % (ref 11.0–15.0)
WBC: 4.7 Thousand/uL (ref 3.8–10.8)

## 2024-07-25 LAB — COMPREHENSIVE METABOLIC PANEL WITH GFR
AG Ratio: 1.7 (calc) (ref 1.0–2.5)
ALT: 15 U/L (ref 6–29)
AST: 20 U/L (ref 10–35)
Albumin: 4.6 g/dL (ref 3.6–5.1)
Alkaline phosphatase (APISO): 65 U/L (ref 37–153)
BUN: 11 mg/dL (ref 7–25)
CO2: 30 mmol/L (ref 20–32)
Calcium: 9.5 mg/dL (ref 8.6–10.4)
Chloride: 104 mmol/L (ref 98–110)
Creat: 0.81 mg/dL (ref 0.50–1.05)
Globulin: 2.7 g/dL (ref 1.9–3.7)
Glucose, Bld: 74 mg/dL (ref 65–99)
Potassium: 4 mmol/L (ref 3.5–5.3)
Sodium: 140 mmol/L (ref 135–146)
Total Bilirubin: 0.5 mg/dL (ref 0.2–1.2)
Total Protein: 7.3 g/dL (ref 6.1–8.1)
eGFR: 82 mL/min/1.73m2 (ref >=60)

## 2024-07-25 LAB — PTH, INTACT (ICMA) AND IONIZED CALCIUM
Calcium, Ion: 5.1 mg/dL (ref 4.7–5.5)
Calcium: 9.6 mg/dL (ref 8.6–10.4)
PTH: 21 pg/mL (ref 16–77)

## 2024-07-25 LAB — VITAMIN D 25 HYDROXY (VIT D DEFICIENCY, FRACTURES): Vit D, 25-Hydroxy: 64 ng/mL (ref 30–100)

## 2024-07-25 LAB — TSH: TSH: 1.97 m[IU]/L (ref 0.40–4.50)

## 2024-07-25 LAB — HEMOGLOBIN A1C
Hgb A1c MFr Bld: 5.6 % (ref <5.7)
Mean Plasma Glucose: 114 mg/dL
eAG (mmol/L): 6.3 mmol/L

## 2024-07-29 LAB — CYTOLOGY - PAP: Diagnosis: NEGATIVE

## 2024-07-30 ENCOUNTER — Ambulatory Visit: Admitting: Family Medicine

## 2024-07-30 ENCOUNTER — Encounter: Payer: Self-pay | Admitting: Family Medicine

## 2024-07-30 VITALS — BP 137/83 | HR 63 | Temp 96.8°F | Resp 18 | Wt 173.2 lb

## 2024-07-30 DIAGNOSIS — K760 Fatty (change of) liver, not elsewhere classified: Secondary | ICD-10-CM | POA: Insufficient documentation

## 2024-07-30 DIAGNOSIS — M8000XS Age-related osteoporosis with current pathological fracture, unspecified site, sequela: Secondary | ICD-10-CM | POA: Diagnosis not present

## 2024-07-30 DIAGNOSIS — E663 Overweight: Secondary | ICD-10-CM | POA: Insufficient documentation

## 2024-07-30 DIAGNOSIS — Z87898 Personal history of other specified conditions: Secondary | ICD-10-CM | POA: Insufficient documentation

## 2024-07-30 DIAGNOSIS — R7989 Other specified abnormal findings of blood chemistry: Secondary | ICD-10-CM | POA: Diagnosis not present

## 2024-07-30 DIAGNOSIS — E782 Mixed hyperlipidemia: Secondary | ICD-10-CM

## 2024-07-30 DIAGNOSIS — M8000XA Age-related osteoporosis with current pathological fracture, unspecified site, initial encounter for fracture: Secondary | ICD-10-CM | POA: Insufficient documentation

## 2024-07-30 NOTE — Progress Notes (Signed)
 Assessment & Plan   Assessment/Plan:   Assessment and Plan Assessment & Plan Elevated ferritin Persistently elevated ferritin levels at 486 with normal iron levels and no anemia. Possible causes include metabolic syndrome, fatty liver disease, or an inflammatory process. Liver function tests are well-managed, and fibrosis-4 score indicates low risk for liver abnormalities. Differential diagnosis includes hemochromatosis, metabolic syndrome, autoimmune diseases, malignancy, and potential liver dysfunction. She has been off iron supplements for six months, yet elevation persists. UTP on cancer screening.  - Refer to hematology for further assessment - Order liver ultrasound with elastography - Order hepatitis B and C screening - Order ESR and CRP to assess for inflammatory causes  Fatty liver disease Previous liver imaging in 2016 indicated fatty liver. Current fibrosis-4 score suggests low risk for significant liver fibrosis. - Order liver ultrasound with elastography  Hyperlipidemia Cholesterol levels slightly elevated with total cholesterol at 216 and LDL at 128, improved from previous measurements.  Obesity Obesity as part of metabolic syndrome. Actively engaging in lifestyle modifications, including exercise and participation in a 5K event.  Prediabetes Previous hemoglobin A1c was 5.6, indicating prediabetes. Current blood sugar levels are well-managed.  Osteoporosis Osteoporosis with compression fractures. She is not interested in medication therapy, focusing on exercise and maintaining vitamin D  and calcium levels for bone health.    Fibrosis 4 Score = 1.22 (Low risk)        Interpretation for patients with NAFLD          <1.30       -  F0-F1 (Low risk)          1.30-2.67 -  Indeterminate           >2.67      -  F3-F4 (High risk)     Validated for ages 31-65          There are no discontinued medications.  Return if symptoms worsen or fail to improve.         Subjective:   Encounter date: 07/30/2024  Sabrina Gallagher is a 62 y.o. female who has OVARIAN FAILURE, PREMATURE; Moderate mixed hyperlipidemia not requiring statin therapy; WEIGHT GAIN; HEARTBURN; Premature ovarian failure; Reflux; Small fiber neuropathy; B12 deficiency; Pre-diabetes; Paresthesias; Muscle pain; Cervical myofascial pain syndrome; Urinary incontinence, mixed; Vaginal atrophy; Constipation; Pelvic organ prolapse quantification stage 1 cystocele; History of UTI; Overweight (BMI 25.0-29.9); Age-related osteoporosis with current pathological fracture; Elevated ferritin; Metabolic dysfunction-associated steatotic liver disease (MASLD); and History of prediabetes on their problem list..   She  has a past medical history of Abnormal EKG, Fatigue, GERD (gastroesophageal reflux disease) (2020), Osteoporosis (11/2017), Pap smear of cervix with ASCUS, cannot exclude HGSIL (08/2015), Premature ovarian failure (age 55), and SOB (shortness of breath)..   She presents with chief complaint of ferritin (Pt c/o of elevated ferritin levels. Pt seen OGBYN on 07/24/2024//HM due- vaccinations (patient declined) ) .   Discussed the use of AI scribe software for clinical note transcription with the patient, who gave verbal consent to proceed.  History of Present Illness Sabrina Gallagher is a 63 year old female who presents for follow-up of elevated ferritin levels.  Elevated ferritin levels - Ferritin initially identified as elevated by OB GYN, measuring 486 ng/mL - Iron level 72 g/dL, TIBC 711 g/dL, transferrin saturation 25% - CBC normal, no anemia present - Ferritin also elevated in April 2025 - Discontinued iron supplements in April 2025, but ferritin remains elevated - Not taking multivitamins; continues vitamin D  and B12 complex supplements  Metabolic  and hepatic health - History of prediabetes, hyperlipidemia, and obesity - Diagnosed with fatty liver in 2016 - Experiencing changes in  weight - No current symptoms of hepatic decompensation  Musculoskeletal symptoms and bone health - History of osteoporosis with compression fractures in the back - Experiencing some generalized aches and pains - Focusing on exercise and monitoring vitamin D  and calcium levels  Physical activity and functional status - Exercising regularly - Joined the [employer] - Preparing for a 5K run - Feels generally well overall     ROS  Past Surgical History:  Procedure Laterality Date   No surgical history     TUBAL LIGATION  1985    Outpatient Medications Prior to Visit  Medication Sig Dispense Refill   B Complex Vitamins (VITAMIN B COMPLEX) TABS 1 tablet Orally once a day     estradiol  (ESTRACE  VAGINAL) 0.1 MG/GM vaginal cream Rub pea size amount each night for 3 weeks then 3 times a week thereafter. 42.5 g 12   VITAMIN D  PO Take 5,000 Int'l Units by mouth daily.     B Complex Vitamins (B COMPLEX PO) daily.     estradiol  (ESTRACE  VAGINAL) 0.1 MG/GM vaginal cream Place 1 Applicatorful vaginally at bedtime. Rub 1mg  in at bedtime for 3 weeks then use 3 times a week there after 42.5 g 12   Ferrous Sulfate (IRON PO) daily. (Patient not taking: Reported on 07/30/2024)     No facility-administered medications prior to visit.    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  Tobacco Use   Smoking status: Never    Passive exposure: Never   Smokeless tobacco: Never  Vaping Use   Vaping status: Never Used  Substance and Sexual  Activity   Alcohol use: Yes    Alcohol/week: 1.0 standard drink of alcohol    Types: 1 Glasses of wine per week    Comment: occ   Drug use: No   Sexual activity: Yes    Partners: Male    Birth control/protection: Post-menopausal    Comment: 1st intercourse 63 yo-Fewer than 5 partners  Other Topics Concern   Not on file  Social History Narrative   Right handed   Caffeine use: rare    Social Drivers of Corporate investment banker Strain: Low Risk  (07/29/2024)   Overall Financial Resource Strain (CARDIA)    Difficulty of Paying Living Expenses: Not hard at all  Food Insecurity: Unknown (07/29/2024)   Hunger Vital Sign    Worried About Running Out of Food in the Last Year: Not on file    Ran Out of Food in the Last Year: Never true  Transportation Needs: No Transportation Needs (07/29/2024)   PRAPARE - Administrator, Civil Service (Medical): No    Lack of Transportation (Non-Medical): No  Physical Activity: Sufficiently Active (07/29/2024)   Exercise Vital Sign  Days of Exercise per Week: 5 days    Minutes of Exercise per Session: 50 min  Stress: No Stress Concern Present (07/29/2024)   [employer] of Occupational Health - Occupational Stress Questionnaire    Feeling of Stress: Not at all  Social Connections: Socially Integrated (07/29/2024)   Social Connection and Isolation Panel    Frequency of Communication with Friends and Family: More than three times a week    Frequency of Social Gatherings with Friends and Family: More than three times a week    Attends Religious Services: More than 4 times per year    Active Member of [employer] or Organizations: Yes    Attends Engineer, structural: More than 4 times per year    Marital Status: Married  Catering manager Violence: Not on file                                                                                                  Objective:  Physical Exam: BP 137/83 (BP Location: Right Arm, Patient  Position: Sitting, Cuff Size: Large)   Pulse 63   Temp (!) 96.8 F (36 C) (Temporal)   Resp 18   Wt 173 lb 3.2 oz (78.6 kg)   SpO2 100%   BMI 29.27 kg/m    Physical Exam GENERAL: Alert, cooperative, well developed, no acute distress HEENT: Normocephalic, normal oropharynx, moist mucous membranes CHEST: Clear to auscultation bilaterally, No wheezes, rhonchi, or crackles CARDIOVASCULAR: Normal heart rate and rhythm, S1 and S2 normal without murmurs ABDOMEN: Soft, non-tender, non-distended, without organomegaly, Normal bowel sounds EXTREMITIES: No cyanosis or edema NEUROLOGICAL: Cranial nerves grossly intact, Moves all extremities without gross motor or sensory deficit   Physical Exam  MM 3D SCREENING MAMMOGRAM BILATERAL BREAST Result Date: 07/09/2024 CLINICAL DATA:  Screening. EXAM: DIGITAL SCREENING BILATERAL MAMMOGRAM WITH TOMOSYNTHESIS AND CAD TECHNIQUE: Bilateral screening digital craniocaudal and mediolateral oblique mammograms were obtained. Bilateral screening digital breast tomosynthesis was performed. The images were evaluated with computer-aided detection. COMPARISON:  Previous exam(s). ACR Breast Density Category b: There are scattered areas of fibroglandular density. FINDINGS: There are no findings suspicious for malignancy. IMPRESSION: No mammographic evidence of malignancy. A result letter of this screening mammogram will be mailed directly to the patient. RECOMMENDATION: Screening mammogram in one year. (Code:SM-B-01Y) BI-RADS CATEGORY  1: Negative. Electronically Signed   By: Rosina Gelineau M.D.   On: 07/09/2024 12:43   DG BONE DENSITY (DXA) Result Date: 05/16/2024 EXAM: DUAL X-RAY ABSORPTIOMETRY (DXA) FOR BONE MINERAL DENSITY 05/15/2024 2:04 pm CLINICAL DATA:  63 year old Female Postmenopausal. Hypoestrogen with risks; Screening for osteoporosis TECHNIQUE: An axial (e.g., hips, spine) and/or appendicular (e.g., radius) exam was performed, as appropriate, using [employer] Sales promotion account executive at [employer]. Images are obtained for bone mineral density measurement and are not obtained for diagnostic purposes. [id number] Exclusions: None. COMPARISON:  None. New baseline. FINDINGS: Scan quality: Good. LUMBAR SPINE (L1-L4): BMD (in g/cm2): 0.823 T-score: -3.0 Z-score: -1.6 LEFT FEMORAL NECK: BMD (in g/cm2): 0.852 T-score: -1.3 Z-score: 0.0 LEFT TOTAL HIP: BMD (in g/cm2): 0.853 T-score: -  1.2 Z-score: -0.2 RIGHT FEMORAL NECK: BMD (in g/cm2): 0.842 T-score: -1.4 Z-score: -0.1 RIGHT TOTAL HIP: BMD (in g/cm2): 0.873 T-score: -1.1 Z-score: 0.0 FRAX 10-YEAR PROBABILITY OF FRACTURE: FRAX not reported as the lowest BMD is not in the osteopenia range. IMPRESSION: Osteoporosis based on BMD. Fracture risk is increased. Increased risk is based on low BMD. RECOMMENDATIONS: 1. All patients should optimize calcium and vitamin D  intake. 2. Consider FDA-approved medical therapies in postmenopausal women and men aged 97 years and older, based on the following: - A hip or vertebral (clinical or morphometric) fracture - T-score less than or equal to -2.5 and secondary causes have been excluded. - Low bone mass (T-score between -1.0 and -2.5) and a 10-year probability of a hip fracture greater than or equal to 3% or a 10-year probability of a major osteoporosis-related fracture greater than or equal to 20% based on the [place] -adapted WHO algorithm. - Clinician judgment and/or patient preferences may indicate treatment for people with 10-year fracture probabilities above or below these levels 3. Patients with diagnosis of osteoporosis or at high risk for fracture should have regular bone mineral density tests. For patients eligible for Medicare, routine testing is allowed once every 2 years. The testing frequency can be increased to one year for patients who have rapidly progressing disease, those who are receiving or discontinuing medical therapy to restore bone mass, or have additional risk  factors. Electronically Signed   By: Dina  Arceo M.D.   On: 05/16/2024 07:12    Recent Results (from the past 2160 hours)  Cytology - PAP     Status: None   Collection Time: 07/24/24  8:40 AM  Result Value Ref Range   Adequacy      Satisfactory but limited for evaluation with partially obscuring   Adequacy      inflammation; transformation zone component present.   Diagnosis      - Negative for intraepithelial lesion or malignancy (NILM)   Comment Inflammation and atrophic changes are present.   CBC     Status: None   Collection Time: 07/24/24  8:47 AM  Result Value Ref Range   WBC 4.7 3.8 - 10.8 Thousand/uL   RBC 4.48 3.80 - 5.10 Million/uL   Hemoglobin 13.0 11.7 - 15.5 g/dL   HCT 59.3 64.9 - 54.9 %   MCV 90.6 80.0 - 100.0 fL   MCH 29.0 27.0 - 33.0 pg   MCHC 32.0 32.0 - 36.0 g/dL    Comment: For adults, a slight decrease in the calculated MCHC value (in the range of 30 to 32 g/dL) is most likely not clinically significant; however, it should be interpreted with caution in correlation with other red cell parameters and the patient's clinical condition.    RDW 13.3 11.0 - 15.0 %   Platelets 262 140 - 400 Thousand/uL   MPV 11.2 7.5 - 12.5 fL  Lipid panel     Status: Abnormal   Collection Time: 07/24/24  8:47 AM  Result Value Ref Range   Cholesterol 216 (H) <200 mg/dL   HDL 70 > OR = 50 mg/dL   Triglycerides 83 <849 mg/dL   LDL Cholesterol (Calc) 128 (H) mg/dL (calc)    Comment: Reference range: <100 . Desirable range <100 mg/dL for primary prevention;   <70 mg/dL for patients with CHD or diabetic patients  with > or = 2 CHD risk factors. SABRA LDL-C is now calculated using the Martin-Hopkins  calculation, which is a validated novel method providing  better  accuracy than the Friedewald equation in the  estimation of LDL-C.  Gladis APPLETHWAITE et al. SANDREA. 7986;689(80): 2061-2068  (http://education.[facility].com/faq/FAQ164)    Total CHOL/HDL Ratio 3.1 <5.0 (calc)    Non-HDL Cholesterol (Calc) 146 (H) <130 mg/dL (calc)    Comment: For patients with diabetes plus 1 major ASCVD risk  factor, treating to a non-HDL-C goal of <100 mg/dL  (LDL-C of <29 mg/dL) is considered a therapeutic  option.   TSH     Status: None   Collection Time: 07/24/24  8:47 AM  Result Value Ref Range   TSH 1.97 0.40 - 4.50 mIU/L  Hemoglobin A1c     Status: None   Collection Time: 07/24/24  8:47 AM  Result Value Ref Range   Hgb A1c MFr Bld 5.6 <5.7 %    Comment: For the purpose of screening for the presence of diabetes: . <5.7%       Consistent with the absence of diabetes 5.7-6.4%    Consistent with increased risk for diabetes             (prediabetes) > or =6.5%  Consistent with diabetes . This assay result is consistent with a decreased risk of diabetes. . Currently, no consensus exists regarding use of hemoglobin A1c for diagnosis of diabetes in children. . According to American Diabetes Association (ADA) guidelines, hemoglobin A1c <7.0% represents optimal control in non-pregnant diabetic patients. Different metrics may apply to specific patient populations.  Standards of Medical Care in Diabetes(ADA). .    Mean Plasma Glucose 114 mg/dL   eAG (mmol/L) 6.3 mmol/L  VITAMIN D  25 Hydroxy (Vit-D Deficiency, Fractures)     Status: None   Collection Time: 07/24/24  8:47 AM  Result Value Ref Range   Vit D, 25-Hydroxy 64 30 - 100 ng/mL    Comment: Vitamin D  Status         25-OH Vitamin D : . Deficiency:                    <20 ng/mL Insufficiency:             20 - 29 ng/mL Optimal:                 > or = 30 ng/mL . For 25-OH Vitamin D  testing on patients on  D2-supplementation and patients for whom quantitation  of D2 and D3 fractions is required, the [facility]ureD(TM) 25-OH VIT D, (D2,D3), LC/MS/MS is recommended: order  code 07111 (patients >59yrs). . See Note 1 . Note 1 . For additional information, please refer to   http://education.[facility].com/faq/FAQ199  (This link is being provided for informational/ educational purposes only.)   Comp Met (CMET)     Status: None   Collection Time: 07/24/24  8:47 AM  Result Value Ref Range   Glucose, Bld 74 65 - 99 mg/dL    Comment: .            Fasting reference interval .    BUN 11 7 - 25 mg/dL   Creat 9.18 9.49 - 8.94 mg/dL   eGFR 82 > OR = 60 fO/fpw/8.26f7   BUN/Creatinine Ratio SEE NOTE: 6 - 22 (calc)    Comment:    Not Reported: BUN and Creatinine are within    reference range. .    Sodium 140 135 - 146 mmol/L   Potassium 4.0 3.5 - 5.3 mmol/L   Chloride 104 98 - 110 mmol/L   CO2 30 20 - 32 mmol/L   Calcium 9.5  8.6 - 10.4 mg/dL   Total Protein 7.3 6.1 - 8.1 g/dL   Albumin 4.6 3.6 - 5.1 g/dL   Globulin 2.7 1.9 - 3.7 g/dL (calc)   AG Ratio 1.7 1.0 - 2.5 (calc)   Total Bilirubin 0.5 0.2 - 1.2 mg/dL   Alkaline phosphatase (APISO) 65 37 - 153 U/L   AST 20 10 - 35 U/L   ALT 15 6 - 29 U/L  Iron, TIBC and Ferritin Panel     Status: Abnormal   Collection Time: 07/24/24  8:47 AM  Result Value Ref Range   Iron 72 45 - 160 mcg/dL   TIBC 711 749 - 549 mcg/dL (calc)   %SAT 25 16 - 45 % (calc)   Ferritin 486 (H) 16 - 288 ng/mL  PTH, Intact (ICMA) and Ionized Calcium     Status: None   Collection Time: 07/24/24  8:47 AM  Result Value Ref Range   PTH 21 16 - 77 pg/mL    Comment: . Interpretive Guide    Intact PTH           Calcium ------------------    ----------           ------- Normal Parathyroid    Normal               Normal Hypoparathyroidism    Low or Low Normal    Low Hyperparathyroidism    Primary            Normal or High       High    Secondary          High                 Normal or Low    Tertiary           High                 High Non-Parathyroid    Hypercalcemia      Low or Low Normal    High .    Calcium 9.6 8.6 - 10.4 mg/dL   Calcium, Ion 5.1 4.7 - 5.5 mg/dL        Beverley Adine Hummer, MD, MS

## 2024-07-30 NOTE — Patient Instructions (Addendum)
  VISIT SUMMARY: Today, we discussed your elevated ferritin levels and reviewed your overall health, including your metabolic and hepatic health, musculoskeletal symptoms, and physical activity. We have planned further tests and referrals to better understand and manage your conditions.  YOUR PLAN: ELEVATED FERRITIN: Your ferritin levels remain high despite stopping iron supplements. This could be due to metabolic syndrome, fatty liver disease, or inflammation. -You will be referred to a hematologist for further assessment. -We will order a liver ultrasound with elastography. -We will screen for hepatitis B and C. -We will check ESR and CRP levels to assess for inflammation.  FATTY LIVER DISEASE: You have a history of fatty liver, and we need to monitor it closely. -We will order a liver ultrasound with elastography.  HYPERLIPIDEMIA: Your cholesterol levels are slightly elevated but have improved. -Continue with your current lifestyle modifications and exercise.  OVERWEIGHT: You are actively working on weight management through exercise and preparing for a 5K event. -Keep up with your exercise routine and healthy lifestyle changes.  PREDIABETES: Your blood sugar levels are well-managed, but you have a history of prediabetes. -Continue monitoring your blood sugar levels and maintain a healthy diet and exercise.  OSTEOPOROSIS: You have osteoporosis with compression fractures and are focusing on exercise and supplements for bone health. -Continue with your exercise routine and take vitamin D  and calcium supplements as advised.                      Contains text generated by [employer].                                 Contains text generated by [employer].

## 2024-07-30 NOTE — Telephone Encounter (Signed)
 Routing to Dr. Glennon to review 07/24/24 PAP results.

## 2024-07-31 ENCOUNTER — Ambulatory Visit (HOSPITAL_BASED_OUTPATIENT_CLINIC_OR_DEPARTMENT_OTHER)

## 2024-07-31 LAB — HEPATITIS B CORE ANTIBODY, TOTAL: Hep B Core Total Ab: NONREACTIVE

## 2024-07-31 LAB — HEPATITIS C ANTIBODY: Hepatitis C Ab: NONREACTIVE

## 2024-07-31 LAB — HEPATITIS B SURFACE ANTIGEN: Hepatitis B Surface Ag: NONREACTIVE

## 2024-07-31 LAB — HEPATITIS B SURFACE ANTIBODY,QUALITATIVE: Hep B S Ab: NONREACTIVE

## 2024-07-31 LAB — C-REACTIVE PROTEIN: CRP: <0.5 mg/dL (ref 0.5–20.0)

## 2024-08-04 ENCOUNTER — Telehealth: Payer: Self-pay

## 2024-08-04 ENCOUNTER — Ambulatory Visit: Payer: Self-pay | Admitting: Family Medicine

## 2024-08-04 ENCOUNTER — Other Ambulatory Visit (INDEPENDENT_AMBULATORY_CARE_PROVIDER_SITE_OTHER)

## 2024-08-04 DIAGNOSIS — R7989 Other specified abnormal findings of blood chemistry: Secondary | ICD-10-CM

## 2024-08-04 DIAGNOSIS — K760 Fatty (change of) liver, not elsewhere classified: Secondary | ICD-10-CM

## 2024-08-04 NOTE — Telephone Encounter (Signed)
 Spoke to patient and informed her that labs need to be recollected due to error. Patient verbalized understanding and is on her way to the clinic.

## 2024-08-04 NOTE — Addendum Note (Signed)
 Addended by: ALTO PARODY D on: 08/04/2024 03:37 PM   Modules accepted: Orders

## 2024-08-04 NOTE — Telephone Encounter (Signed)
 Copied from CRM [id number]. Topic: General - Other >> Aug 04, 2024  3:11 PM Mercedes MATSU wrote: Reason for CRM: Patient called in stating she had a missed call with no documentation. Patient requesting a call back and can be reached at [phone].

## 2024-08-05 LAB — SEDIMENTATION RATE: Sed Rate: 21 mm/h (ref 0–30)

## 2024-08-08 ENCOUNTER — Other Ambulatory Visit: Payer: Self-pay | Admitting: Family

## 2024-08-08 DIAGNOSIS — R7989 Other specified abnormal findings of blood chemistry: Secondary | ICD-10-CM

## 2024-08-11 ENCOUNTER — Inpatient Hospital Stay: Attending: Hematology & Oncology

## 2024-08-11 ENCOUNTER — Encounter: Payer: Self-pay | Admitting: Family

## 2024-08-11 ENCOUNTER — Inpatient Hospital Stay (HOSPITAL_BASED_OUTPATIENT_CLINIC_OR_DEPARTMENT_OTHER): Admitting: Family

## 2024-08-11 VITALS — BP 152/84 | HR 56 | Temp 98.4°F | Resp 18 | Ht 64.0 in | Wt 173.1 lb

## 2024-08-11 DIAGNOSIS — R7303 Prediabetes: Secondary | ICD-10-CM | POA: Insufficient documentation

## 2024-08-11 DIAGNOSIS — M810 Age-related osteoporosis without current pathological fracture: Secondary | ICD-10-CM | POA: Diagnosis not present

## 2024-08-11 DIAGNOSIS — Z8042 Family history of malignant neoplasm of prostate: Secondary | ICD-10-CM | POA: Diagnosis not present

## 2024-08-11 DIAGNOSIS — M85851 Other specified disorders of bone density and structure, right thigh: Secondary | ICD-10-CM | POA: Diagnosis not present

## 2024-08-11 DIAGNOSIS — K760 Fatty (change of) liver, not elsewhere classified: Secondary | ICD-10-CM

## 2024-08-11 DIAGNOSIS — Z808 Family history of malignant neoplasm of other organs or systems: Secondary | ICD-10-CM

## 2024-08-11 DIAGNOSIS — M85852 Other specified disorders of bone density and structure, left thigh: Secondary | ICD-10-CM | POA: Insufficient documentation

## 2024-08-11 DIAGNOSIS — R7989 Other specified abnormal findings of blood chemistry: Secondary | ICD-10-CM | POA: Diagnosis not present

## 2024-08-11 LAB — CBC WITH DIFFERENTIAL (CANCER CENTER ONLY)
Abs Immature Granulocytes: 0 K/uL (ref 0.00–0.07)
Basophils Absolute: 0.1 K/uL (ref 0.0–0.1)
Basophils Relative: 1 %
Eosinophils Absolute: 0.1 K/uL (ref 0.0–0.5)
Eosinophils Relative: 1 %
HCT: 40.6 % (ref 36.0–46.0)
Hemoglobin: 13 g/dL (ref 12.0–15.0)
Immature Granulocytes: 0 %
Lymphocytes Relative: 54 %
Lymphs Abs: 2.3 K/uL (ref 0.7–4.0)
MCH: 29 pg (ref 26.0–34.0)
MCHC: 32 g/dL (ref 30.0–36.0)
MCV: 90.6 fL (ref 80.0–100.0)
Monocytes Absolute: 0.4 K/uL (ref 0.1–1.0)
Monocytes Relative: 8 %
Neutro Abs: 1.6 K/uL — ABNORMAL LOW (ref 1.7–7.7)
Neutrophils Relative %: 36 %
Platelet Count: 228 K/uL (ref 150–400)
RBC: 4.48 MIL/uL (ref 3.87–5.11)
RDW: 13.2 % (ref 11.5–15.5)
WBC Count: 4.4 K/uL (ref 4.0–10.5)
nRBC: 0 % (ref 0.0–0.2)

## 2024-08-11 LAB — LACTATE DEHYDROGENASE: LDH: 168 U/L (ref 98–192)

## 2024-08-11 LAB — CMP (CANCER CENTER ONLY)
ALT: 16 U/L (ref 0–44)
AST: 22 U/L (ref 15–41)
Albumin: 4.7 g/dL (ref 3.5–5.0)
Alkaline Phosphatase: 70 U/L (ref 38–126)
Anion gap: 11 (ref 5–15)
BUN: 11 mg/dL (ref 8–23)
CO2: 27 mmol/L (ref 22–32)
Calcium: 9.6 mg/dL (ref 8.9–10.3)
Chloride: 103 mmol/L (ref 98–111)
Creatinine: 0.74 mg/dL (ref 0.44–1.00)
GFR, Estimated: >60 mL/min (ref >60)
Glucose, Bld: 88 mg/dL (ref 70–99)
Potassium: 4 mmol/L (ref 3.5–5.1)
Sodium: 142 mmol/L (ref 135–145)
Total Bilirubin: 0.3 mg/dL (ref 0.0–1.2)
Total Protein: 7.7 g/dL (ref 6.5–8.1)

## 2024-08-11 LAB — IRON AND IRON BINDING CAPACITY (CC-WL,HP ONLY)
Iron: 70 ug/dL (ref 28–170)
Saturation Ratios: 23 % (ref 10.4–31.8)
TIBC: 304 ug/dL (ref 250–450)
UIBC: 234 ug/dL

## 2024-08-11 LAB — FERRITIN: Ferritin: 668 ng/mL — ABNORMAL HIGH (ref 11–307)

## 2024-08-11 NOTE — Progress Notes (Signed)
 Hematology/Oncology Consultation   Name: Sabrina Gallagher      MRN: [id number]    Location: Room/bed info not found  Date: 08/11/2024 Time:8:51 AM   REFERRING PHYSICIAN:  Beverley Hummer, MD  REASON FOR CONSULT: Metabolic dysfunction - associated steatotic liver disease    DIAGNOSIS: Elevated ferritin, hemochromatosis DNA pending  HISTORY OF PRESENT ILLNESS: Sabrina Gallagher is a very pleasant African American female with recent history of mildly elevated ferritin level with normal iron saturation.  Ferritin 3 weeks ago was 486 and saturation 25%.  No erythrocytosis associated with the elevated ferritin.  LFT's and LDH are within normal limits.  She has history of hepatic steatosis and goes tomorrow for follow-up US  to reassess.  No pain or organomegaly noted on today's exam.  No known familial history of elevated iron or hemochromatosis.  She is prediabetic but treating with lifestyle changes.  No thyroid  disease.  No issue with frequent or recurrent infections. No fever, chills, n/v, cough, rash, dizziness, SOB, chest pain, palpitations, abdominal pain or changes in bowel or bladder habits at this time.  She has had intermittent upper right quadrant pain. She states that Sabrina wok up so far has been negative.  She states that she had an EGD which was negative and colonoscopy in 02/2024 with 2 benign polyps removed from the ascending and sigmoid colon.  Mammogram last month was negative.  No personal history of cancer. Sabrina Gallagher had metastatic prostate and Sabrina Gallagher had a dysplastic lesion removed from Sabrina tongue.  No swelling, tenderness, numbness or tingling in Sabrina extremities.  She does have history of osteoporosis in Sabrina spine and osteopenia in Sabrina hips.   She has been going to the gym several days a week to work out and build stamina.  Occasional palpitations with anxiety.  No smoking, ETOH or recreational drug use.  Appetite and hydration are good. She eats only chicken and salmon for meat.  Weight is described as stable at 173 lbs.  She is now retired and loves spending time with Sabrina children and two sweet twin granddaughters.   ROS: All other 10 point review of systems is negative.   PAST MEDICAL HISTORY:   Past Medical History:  Diagnosis Date   Abnormal EKG    Fatigue    GERD (gastroesophageal reflux disease) 2020   Osteoporosis 11/2017   T score -3.0 needs bone builder. no fractures. considering options   Pap smear of cervix with ASCUS, cannot exclude HGSIL 08/2015   colposcopy biopsy 12:00 with LGSIL negative ECC   Premature ovarian failure age 63   SOB (shortness of breath)     ALLERGIES: No Known Allergies    MEDICATIONS:  Current Outpatient Medications on File Prior to Visit  Medication Sig Dispense Refill   B Complex Vitamins (B COMPLEX PO) daily.     B Complex Vitamins (VITAMIN B COMPLEX) TABS 1 tablet Orally once a day     estradiol  (ESTRACE  VAGINAL) 0.1 MG/GM vaginal cream Rub pea size amount each night for 3 weeks then 3 times a week thereafter. 42.5 g 12   estradiol  (ESTRACE  VAGINAL) 0.1 MG/GM vaginal cream Place 1 Applicatorful vaginally at bedtime. Rub 1mg  in at bedtime for 3 weeks then use 3 times a week there after 42.5 g 12   Ferrous Sulfate (IRON PO) daily. (Patient not taking: Reported on 07/30/2024)     VITAMIN D  PO Take 5,000 Int'l Units by mouth daily.     No current facility-administered medications on file prior to  visit.     PAST SURGICAL HISTORY Past Surgical History:  Procedure Laterality Date   No surgical history     TUBAL LIGATION  1985    FAMILY HISTORY: Family History  Problem Relation Age of Onset   Cancer Sabrina Gallagher        Oral-tongue   Varicose Veins Sabrina Gallagher    Colon cancer Gallagher    Cancer Gallagher    Neurofibromatosis Neg Hx    Bladder Cancer Neg Hx    Uterine cancer Neg Hx     SOCIAL HISTORY:  reports that she has never smoked. She has never been exposed to tobacco smoke. She has never used smokeless tobacco. She  reports current alcohol use of about 1.0 standard drink of alcohol per week. She reports that she does not use drugs.  PERFORMANCE STATUS: The patient's performance status is 0 - Asymptomatic  PHYSICAL EXAM: Most Recent Vital Signs: There were no vitals taken for this visit. BP (!) 152/84 (BP Location: Right Arm, Patient Position: Sitting)   Pulse (!) 56   Temp 98.4 F (36.9 C) (Oral)   Resp 18   Ht 5' 4 (1.626 m)   Wt 173 lb 1.3 oz (78.5 kg)   SpO2 100%   BMI 29.71 kg/m   General Appearance:    Alert, cooperative, no distress, appears stated age  Head:    Normocephalic, without obvious abnormality, atraumatic  Eyes:    PERRL, conjunctiva/corneas clear, EOM's intact, fundi    benign, both eyes        Throat:   Lips, mucosa, and tongue normal; teeth and gums normal  Neck:   Supple, symmetrical, trachea midline, no adenopathy;    thyroid :  no enlargement/tenderness/nodules; no carotid   bruit or JVD  Back:     Symmetric, no curvature, ROM normal, no CVA tenderness  Lungs:     Clear to auscultation bilaterally, respirations unlabored  Chest Wall:    No tenderness or deformity   Heart:    Regular rate and rhythm, S1 and S2 normal, no murmur, rub   or gallop     Abdomen:     Soft, non-tender, bowel sounds active all four quadrants,    no masses, no organomegaly        Extremities:   Extremities normal, atraumatic, no cyanosis or edema  Pulses:   2+ and symmetric all extremities  Skin:   Skin color, texture, turgor normal, no rashes or lesions  Lymph nodes:   Cervical, supraclavicular, and axillary nodes normal  Neurologic:   CNII-XII intact, normal strength, sensation and reflexes    throughout    LABORATORY DATA:  No results found for this or any previous visit (from the past 48 hours).    RADIOGRAPHY: No results found.     PATHOLOGY: None   ASSESSMENT/PLAN: [name] is a very pleasant African American female with recent history of mildly elevated ferritin level  with normal iron saturation.  Iron studies and hemochromatosis DNA are pending.   Follow-up and treatment plan pending results.   All questions were answered. The patient knows to call the clinic with any problems, questions or concerns. We can certainly see the patient much sooner if necessary.  The patient was discussed with Dr. Timmy and he is in agreement with the aforementioned.   Lauraine Pepper, NP

## 2024-08-12 ENCOUNTER — Ambulatory Visit (HOSPITAL_BASED_OUTPATIENT_CLINIC_OR_DEPARTMENT_OTHER)
Admission: RE | Admit: 2024-08-12 | Discharge: 2024-08-12 | Disposition: A | Discharge status: Home / Self Care | Source: Ambulatory Visit | Attending: Family Medicine | Admitting: Family Medicine

## 2024-08-12 DIAGNOSIS — R7989 Other specified abnormal findings of blood chemistry: Secondary | ICD-10-CM | POA: Insufficient documentation

## 2024-08-12 DIAGNOSIS — K760 Fatty (change of) liver, not elsewhere classified: Secondary | ICD-10-CM | POA: Diagnosis present

## 2024-08-14 LAB — HEMOCHROMATOSIS DNA-PCR(C282Y,H63D)

## 2024-08-15 ENCOUNTER — Encounter: Payer: Self-pay | Admitting: Family

## 2024-08-20 ENCOUNTER — Encounter: Payer: Self-pay | Admitting: Nurse Practitioner

## 2024-08-20 ENCOUNTER — Ambulatory Visit: Admitting: Nurse Practitioner

## 2024-08-20 VITALS — BP 130/80 | HR 61 | Temp 96.8°F | Ht 64.0 in | Wt 172.6 lb

## 2024-08-20 DIAGNOSIS — M25561 Pain in right knee: Secondary | ICD-10-CM

## 2024-08-20 MED ORDER — NAPROXEN 500 MG PO TABS: 500.0000 mg | ORAL_TABLET | Freq: Two times a day (BID) | ORAL | 0 refills | Status: AC

## 2024-08-20 NOTE — Progress Notes (Unsigned)
   Acute Office Visit  Subjective:     Patient ID: Sabrina Gallagher, female    DOB: 12/29/1960, 63 y.o.   MRN: [id number]  Chief Complaint  Patient presents with   Knee Pain    Bilateral knee pain, swelling in right knee    HPI Patient is in today for ***  ROS      Objective:    BP 130/80 (BP Location: Left Arm, Patient Position: Sitting, Cuff Size: Normal)   Pulse 61   Temp (!) 96.8 F (36 C)   Ht 5' 4 (1.626 m)   Wt 172 lb 9.6 oz (78.3 kg)   SpO2 99%   BMI 29.63 kg/m  {Vitals History (Optional):23777}  Physical Exam  No results found for any visits on 08/20/24.      Assessment & Plan:   Problem List Items Addressed This Visit   None   Meds ordered this encounter  Medications   naproxen (NAPROSYN) 500 MG tablet    Sig: Take 1 tablet (500 mg total) by mouth 2 (two) times daily with a meal.    Dispense:  30 tablet    Refill:  0    Return if symptoms worsen or fail to improve.  Tinnie DELENA Harada, NP

## 2024-08-20 NOTE — Patient Instructions (Signed)
 It was great to see you!  Start naproxen twice a day with food for 7 days, then as needed   Continue to ice your knee 3 times a day for 10 minutes   Wear the knee compression sleeve during the day   No squats, lunges, running, cardio for 2 weeks   Let's follow-up if symptoms worsen or don't improve   Take care,  Tinnie Harada, NP

## 2024-08-26 ENCOUNTER — Other Ambulatory Visit: Payer: Self-pay | Admitting: Family

## 2024-08-26 DIAGNOSIS — R7989 Other specified abnormal findings of blood chemistry: Secondary | ICD-10-CM

## 2024-09-05 ENCOUNTER — Encounter: Admitting: Family

## 2024-09-05 ENCOUNTER — Inpatient Hospital Stay

## 2024-09-29 ENCOUNTER — Ambulatory Visit: Payer: Self-pay

## 2024-09-29 NOTE — Telephone Encounter (Signed)
 FYI Only or Action Required?: FYI only for provider: appointment scheduled on 12/4.  Patient was last seen in primary care on 08/20/2024 by Nedra Tinnie LABOR, NP.  Called Nurse Triage reporting Joint Swelling.  Symptoms began several weeks ago.  Interventions attempted: OTC medications: naproxen .  Symptoms are: unchanged.  Triage Disposition: See PCP When Office is Open (Within 3 Days)  Patient/caregiver understands and will follow disposition?: Yes, will follow disposition  Copied from CRM [id number]. Topic: Clinical - Red Word Triage >> Sep 29, 2024  3:33 PM Mesmerise C wrote: Red Word that prompted transfer to Nurse Triage: Patient states she's still having swelling to her knee and stiffness to the back of her knee states there's a lot of pain to it especially when she walks, was given naproxen  for it but no improvement Reason for Disposition  MILD or MODERATE swelling (e.g., can't move joint normally, can't do usual activities) (Exceptions: Itchy, localized swelling; swelling is chronic.)  Answer Assessment - Initial Assessment Questions 1. LOCATION: Where is the swelling located?  (e.g., left, right, both knees)     R knee 2. ONSET: When did the swelling start? Does it come and go, or is it there all the time?     Ongoing has been seen for this before, was given naproxen  and pt states she has been doing everything she was told, states that it is not getting better.  3. SWELLING: How bad is the swelling? Or, How large is it? (e.g., mild, moderate, severe; size of localized swelling)      Back of knee still swollen, mild 4. PAIN: Is there any pain? If Yes, ask: How bad is it? (Scale 0-10; or none, mild, moderate, severe)     Severe at times 5. SETTING: Has there been any recent work, exercise or other activity that involved that part of the body?      Was training for a 5k 6. AGGRAVATING FACTORS: What makes the knee swelling worse? (e.g., walking, climbing stairs,  running)     Moving make it worse 7. ASSOCIATED SYMPTOMS: Is there any pain or redness?     Pain, denies redness 8. OTHER SYMPTOMS: Do you have any other symptoms? (e.g., calf pain, chest pain, difficulty breathing, fever)     denies  Protocols used: Knee Swelling-A-AH

## 2024-09-30 NOTE — Telephone Encounter (Signed)
 Noted. Patient was triaged and scheduled for 10/02/24.

## 2024-10-02 ENCOUNTER — Ambulatory Visit

## 2024-10-02 ENCOUNTER — Ambulatory Visit: Admitting: Family Medicine

## 2024-10-02 VITALS — BP 118/90 | HR 65 | Temp 98.1°F | Ht 64.0 in | Wt 177.0 lb

## 2024-10-02 DIAGNOSIS — M25561 Pain in right knee: Secondary | ICD-10-CM | POA: Diagnosis not present

## 2024-10-02 MED ORDER — MELOXICAM 15 MG PO TABS: 15.0000 mg | ORAL_TABLET | Freq: Every day | ORAL | 1 refills | Status: DC

## 2024-10-02 NOTE — Progress Notes (Signed)
 Assessment & Plan   Assessment/Plan:  Assessment and Plan Assessment & Plan Right knee pain and swelling Persisting for approximately two months, exacerbated by running and high-intensity activities. Pain is localized to the lateral aspect of the knee and posteriorly, with swelling to the right of the patella. No locking, clicking, or instability on examination. Differential diagnosis includes hamstring sprain, patellar inflammation, and possible mild arthritis. Imaging is warranted to rule out fracture and assess for other structural issues. Physical therapy is recommended to improve strength and stability of the knee. Meloxicam  is suggested for anti-inflammatory management, with emphasis on targeted rehabilitation and gradual return to activity. - Ordered right knee x-ray to assess for fracture and other structural issues. - Referred to physical therapy for targeted rehabilitation and strengthening exercises. - Prescribed meloxicam  for anti-inflammatory management, to be taken with food, especially on days of increased activity. - Advised continuation of ice and heat application for symptom management. - Encouraged gradual return to non-impact activities such as treadmill, elliptical, swimming, and targeted strength training. - Instructed to monitor for any signs of instability or worsening symptoms, and to consider referral to sports medicine if necessary. - F/U as needed      There are no discontinued medications.          Subjective:   Encounter date: 10/02/2024  Sabrina Gallagher is a 63 y.o. female who has OVARIAN FAILURE, PREMATURE; Moderate mixed hyperlipidemia not requiring statin therapy; WEIGHT GAIN; HEARTBURN; Premature ovarian failure; Reflux; Small fiber neuropathy; B12 deficiency; Pre-diabetes; Paresthesias; Muscle pain; Cervical myofascial pain syndrome; Urinary incontinence, mixed; Vaginal atrophy; Constipation; Pelvic organ prolapse quantification stage 1 cystocele;  History of UTI; Overweight (BMI 25.0-29.9); Age-related osteoporosis with current pathological fracture; Elevated ferritin; Metabolic dysfunction-associated steatotic liver disease (MASLD); and History of prediabetes on their problem list..   She  has a past medical history of Abnormal EKG, Fatigue, GERD (gastroesophageal reflux disease) (2020), Osteoporosis (11/2017), Pap smear of cervix with ASCUS, cannot exclude HGSIL (08/2015), Premature ovarian failure (age 36), and SOB (shortness of breath)..   She presents with chief complaint of Knee Pain (FU after knee pain after 5k run in October. Saw Tinnie Harada in October and rested for 2 weeks as advised and resumed training 2x week to prep for 5k in December. Edema to posterior patella area. Goal is to resume workout and another 5k. RICE, heat, and elastic sleeve at home. Taking naproxen  as needed.) .  Discussed the use of AI scribe software for clinical note transcription with the patient, who gave verbal consent to proceed.  History of Present Illness Sabrina Gallagher is a 63 year old female who presents with knee pain following a 5K run.  Right knee pain and swelling - Onset after participating in a 5K run in October - Pain localized to the lateral aspect of the right knee, sometimes radiating to the posterior knee and occasionally with shooting sensations through the leg - Pain intensity increases with prolonged activity, fast walking, and knee flexion, especially in the posterior knee - Swelling most prominent to the right of the patella; swelling has decreased since its peak but persists - No prior history of knee injuries or surgeries - No pain prior to this episode; childhood scar on knee unrelated to current symptoms  Functional limitations and impact on activity - Unable to run or participate in usual exercise routines, including cardio, yoga, and tai chi, due to knee pain - Discomfort persists during regular activities such as walking  long distances - Had been  training for another 5K event in December but had to withdraw due to knee symptoms - Desires to return to regular fitness activities, including visits to the [facility]  Symptom management and response to treatment - Initial management with rest and naproxen  for two weeks provided some relief - Knee sleeve used for support, which provides partial symptom relief - Pain and swelling recur or worsen with resumption of activity   ROS  Past Surgical History:  Procedure Laterality Date   No surgical history     TUBAL LIGATION  1985    Current Outpatient Medications on File Prior to Visit  Medication Sig Dispense Refill   B Complex Vitamins (B COMPLEX PO) daily.     estradiol  (ESTRACE  VAGINAL) 0.1 MG/GM vaginal cream Rub pea size amount each night for 3 weeks then 3 times a week thereafter. 42.5 g 12   naproxen  (NAPROSYN ) 500 MG tablet Take 1 tablet (500 mg total) by mouth 2 (two) times daily with a meal. 30 tablet 0   VITAMIN D  PO Take 5,000 Int'l Units by mouth daily.     Ferrous Sulfate (IRON PO) daily. (Patient not taking: Reported on 08/20/2024)     No current facility-administered medications on file prior to visit.    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  Tobacco Use   Smoking status: Never    Passive exposure: Never   Smokeless tobacco: Never  Vaping Use   Vaping status: Never Used  Substance and Sexual Activity   Alcohol use: Yes    Alcohol/week: 1.0 standard drink of alcohol     Types: 1 Glasses of wine per week    Comment: occ   Drug use: No   Sexual activity: Yes    Partners: Male    Birth control/protection: Post-menopausal    Comment: 1st intercourse 63 yo-Fewer than 5 partners  Other Topics Concern   Not on file  Social History Narrative   Right handed   Caffeine use: rare    Social Drivers of Health   Financial Resource Strain: Low Risk  (10/02/2024)   Overall Financial Resource Strain (CARDIA)    Difficulty of Paying Living Expenses: Not hard at all  Food Insecurity: No Food Insecurity (10/02/2024)   Hunger Vital Sign    Worried About Running Out of Food in the Last Year: Never true    Ran Out of Food in the Last Year: Never true  Transportation Needs: No Transportation Needs (10/02/2024)   PRAPARE - Administrator, Civil Service (Medical): No    Lack of Transportation (Non-Medical): No  Physical Activity: Sufficiently Active (10/02/2024)   Exercise Vital Sign    Days of Exercise per Week: 5 days    Minutes of Exercise per Session: 50 min  Stress: No Stress Concern Present (07/29/2024)   [employer] of Occupational Health - Occupational Stress Questionnaire  Feeling of Stress: Not at all  Social Connections: Socially Integrated (10/02/2024)   Social Connection and Isolation Panel    Frequency of Communication with Friends and Family: More than three times a week    Frequency of Social Gatherings with Friends and Family: Three times a week    Attends Religious Services: More than 4 times per year    Active Member of Clubs or Organizations: Yes    Attends Banker Meetings: More than 4 times per year    Marital Status: Married  Catering Manager Violence: Not At Risk (08/11/2024)   Humiliation, Afraid, Rape, and Kick questionnaire    Fear of Current or Ex-Partner: No    Emotionally Abused: No    Physically Abused: No    Sexually Abused: No                                                                                                   Objective:  Physical Exam: BP (!) 118/90 (BP Location: Right Arm, Patient Position: Sitting)   Pulse 65   Temp 98.1 F (36.7 C) (Oral)   Ht 5' 4 (1.626 m)   Wt 177 lb (80.3 kg)   SpO2 98%   BMI 30.38 kg/m    Physical Exam         Physical Exam GENERAL: Alert, cooperative, well developed, no acute distress. HEENT: Normocephalic, normal oropharynx, moist mucous membranes. CHEST: Clear to auscultation bilaterally, no wheezes, rhonchi, or crackles. CARDIOVASCULAR: Normal heart rate and rhythm, S1 and S2 normal without murmurs. ABDOMEN: Soft, non-tender, non-distended, without organomegaly, normal bowel sounds. EXTREMITIES: No cyanosis or edema. MUSCULOSKELETAL: No tenderness on palpation of the knee, swelling to the right of the patella, knee stable on examination, no locking or clicking of the knee, pain in the posterior knee on flexion. NEUROLOGICAL: Cranial nerves grossly intact, moves all extremities without gross motor or sensory deficit.  US  ABDOMEN RUQ W/ELASTOGRAPHY Result Date: 08/12/2024 CLINICAL DATA:  Hepatic steatosis.  Increased ferritin EXAM: US  ABDOMEN LIMITED - RIGHT UPPER QUADRANT ULTRASOUND HEPATIC ELASTOGRAPHY TECHNIQUE: Sonography of the right upper quadrant was performed. In addition, ultrasound elastography evaluation of the liver was performed. A region of interest was placed within the right lobe of the liver. Following application of a compressive sonographic pulse, tissue compressibility was assessed. Multiple assessments were performed at the selected site. Median tissue compressibility was determined. Previously, hepatic stiffness was assessed by shear wave velocity. Based on recently published Society of Radiologists in Ultrasound consensus article, reporting is now recommended to be performed in the SI units of pressure (kiloPascals) representing hepatic stiffness/elasticity. The obtained result is compared to the published reference  standards. (cACLD = compensated Advanced Chronic Liver Disease) COMPARISON:  Abdominal ultrasound examination dated 12/16/2020 FINDINGS: ULTRASOUND ABDOMEN LIMITED RIGHT UPPER QUADRANT Gallbladder: No gallstones or wall thickening visualized. No sonographic Murphy sign noted. Common bile duct: Diameter: 4 mm Liver: No focal lesion identified. Within normal limits in parenchymal echogenicity. Portal vein is patent on color Doppler imaging with normal direction of blood flow towards the liver. ULTRASOUND HEPATIC ELASTOGRAPHY Device: Siemens Helix VTQ  Patient position: Supine Transducer 5C1 Number of measurements: 10 Hepatic segment:  8 Median kPa: 2.5 IQR: 0.5 IQR/Median kPa ratio: 0.20 Data quality:  Good Diagnostic category:  < or = 5 kPa: high probability of being normal The use of hepatic elastography is applicable to patients with viral hepatitis and non-alcoholic fatty liver disease. At this time, there is insufficient data for the referenced cut-off values and use in other causes of liver disease, including alcoholic liver disease. Patients, however, may be assessed by elastography and serve as their own reference standard/baseline. In patients with non-alcoholic liver disease, the values suggesting compensated advanced chronic liver disease (cACLD) may be lower, and patients may need additional testing with elasticity results of 7-9 kPa. Please note that abnormal hepatic elasticity and shear wave velocities may also be identified in clinical settings other than with hepatic fibrosis, such as: acute hepatitis, elevated right heart and central venous pressures including use of beta blockers, veno-occlusive disease (Budd-Chiari), infiltrative processes such as mastocytosis/amyloidosis/infiltrative tumor/lymphoma, extrahepatic cholestasis, with hyperemia in the post-prandial state, and with liver transplantation. Correlation with patient history, laboratory data, and clinical condition recommended. Diagnostic  Categories: < or =5 kPa: high probability of being normal < or =9 kPa: in the absence of other known clinical signs, rules out cACLD >9 kPa and ?13 kPa: suggestive of cACLD, but needs further testing >13 kPa: highly suggestive of cACLD > or =17 kPa: highly suggestive of cACLD with an increased probability of clinically significant portal hypertension IMPRESSION: ULTRASOUND RUQ: Normal right upper quadrant ultrasound examination. ULTRASOUND HEPATIC ELASTOGRAPHY: Median kPa:  2.5 Diagnostic category:  < or = 5 kPa: high probability of being normal Electronically Signed   By: Limin  Xu M.D.   On: 08/12/2024 15:18    Recent Results (from the past 2160 hours)  Cytology - PAP     Status: None   Collection Time: 07/24/24  8:40 AM  Result Value Ref Range   Adequacy      Satisfactory but limited for evaluation with partially obscuring   Adequacy      inflammation; transformation zone component present.   Diagnosis      - Negative for intraepithelial lesion or malignancy (NILM)   Comment Inflammation and atrophic changes are present.   CBC     Status: None   Collection Time: 07/24/24  8:47 AM  Result Value Ref Range   WBC 4.7 3.8 - 10.8 Thousand/uL   RBC 4.48 3.80 - 5.10 Million/uL   Hemoglobin 13.0 11.7 - 15.5 g/dL   HCT 59.3 64.9 - 54.9 %   MCV 90.6 80.0 - 100.0 fL   MCH 29.0 27.0 - 33.0 pg   MCHC 32.0 32.0 - 36.0 g/dL    Comment: For adults, a slight decrease in the calculated MCHC value (in the range of 30 to 32 g/dL) is most likely not clinically significant; however, it should be interpreted with caution in correlation with other red cell parameters and the patient's clinical condition.    RDW 13.3 11.0 - 15.0 %   Platelets 262 140 - 400 Thousand/uL   MPV 11.2 7.5 - 12.5 fL  Lipid panel     Status: Abnormal   Collection Time: 07/24/24  8:47 AM  Result Value Ref Range   Cholesterol 216 (H) <200 mg/dL   HDL 70 > OR = 50 mg/dL   Triglycerides 83 <849 mg/dL   LDL Cholesterol (Calc)  128 (H) mg/dL (calc)    Comment: Reference range: <100 . Desirable range <100  mg/dL for primary prevention;   <70 mg/dL for patients with CHD or diabetic patients  with > or = 2 CHD risk factors. SABRA LDL-C is now calculated using the Martin-Hopkins  calculation, which is a validated novel method providing  better accuracy than the Friedewald equation in the  estimation of LDL-C.  Gladis APPLETHWAITE et al. SANDREA. 7986;689(80): 2061-2068  (http://education.[facility].com/faq/FAQ164)    Total CHOL/HDL Ratio 3.1 <5.0 (calc)   Non-HDL Cholesterol (Calc) 146 (H) <130 mg/dL (calc)    Comment: For patients with diabetes plus 1 major ASCVD risk  factor, treating to a non-HDL-C goal of <100 mg/dL  (LDL-C of <29 mg/dL) is considered a therapeutic  option.   TSH     Status: None   Collection Time: 07/24/24  8:47 AM  Result Value Ref Range   TSH 1.97 0.40 - 4.50 mIU/L  Hemoglobin A1c     Status: None   Collection Time: 07/24/24  8:47 AM  Result Value Ref Range   Hgb A1c MFr Bld 5.6 <5.7 %    Comment: For the purpose of screening for the presence of diabetes: . <5.7%       Consistent with the absence of diabetes 5.7-6.4%    Consistent with increased risk for diabetes             (prediabetes) > or =6.5%  Consistent with diabetes . This assay result is consistent with a decreased risk of diabetes. . Currently, no consensus exists regarding use of hemoglobin A1c for diagnosis of diabetes in children. . According to American Diabetes Association (ADA) guidelines, hemoglobin A1c <7.0% represents optimal control in non-pregnant diabetic patients. Different metrics may apply to specific patient populations.  Standards of Medical Care in Diabetes(ADA). .    Mean Plasma Glucose 114 mg/dL   eAG (mmol/L) 6.3 mmol/L  VITAMIN D  25 Hydroxy (Vit-D Deficiency, Fractures)     Status: None   Collection Time: 07/24/24  8:47 AM  Result Value Ref Range   Vit D, 25-Hydroxy 64 30 - 100 ng/mL     Comment: Vitamin D  Status         25-OH Vitamin D : . Deficiency:                    <20 ng/mL Insufficiency:             20 - 29 ng/mL Optimal:                 > or = 30 ng/mL . For 25-OH Vitamin D  testing on patients on  D2-supplementation and patients for whom quantitation  of D2 and D3 fractions is required, the QuestAssureD(TM) 25-OH VIT D, (D2,D3), LC/MS/MS is recommended: order  code 07111 (patients >31yrs). . See Note 1 . Note 1 . For additional information, please refer to  http://education.[facility].com/faq/FAQ199  (This link is being provided for informational/ educational purposes only.)   Comp Met (CMET)     Status: None   Collection Time: 07/24/24  8:47 AM  Result Value Ref Range   Glucose, Bld 74 65 - 99 mg/dL    Comment: .            Fasting reference interval .    BUN 11 7 - 25 mg/dL   Creat 9.18 9.49 - 8.94 mg/dL   eGFR 82 > OR = 60 fO/fpw/8.26f7   BUN/Creatinine Ratio SEE NOTE: 6 - 22 (calc)    Comment:    Not Reported: BUN and Creatinine are within  reference range. .    Sodium 140 135 - 146 mmol/L   Potassium 4.0 3.5 - 5.3 mmol/L   Chloride 104 98 - 110 mmol/L   CO2 30 20 - 32 mmol/L   Calcium 9.5 8.6 - 10.4 mg/dL   Total Protein 7.3 6.1 - 8.1 g/dL   Albumin 4.6 3.6 - 5.1 g/dL   Globulin 2.7 1.9 - 3.7 g/dL (calc)   AG Ratio 1.7 1.0 - 2.5 (calc)   Total Bilirubin 0.5 0.2 - 1.2 mg/dL   Alkaline phosphatase (APISO) 65 37 - 153 U/L   AST 20 10 - 35 U/L   ALT 15 6 - 29 U/L  Iron, TIBC and Ferritin Panel     Status: Abnormal   Collection Time: 07/24/24  8:47 AM  Result Value Ref Range   Iron 72 45 - 160 mcg/dL   TIBC 711 749 - 549 mcg/dL (calc)   %SAT 25 16 - 45 % (calc)   Ferritin 486 (H) 16 - 288 ng/mL  PTH, Intact (ICMA) and Ionized Calcium     Status: None   Collection Time: 07/24/24  8:47 AM  Result Value Ref Range   PTH 21 16 - 77 pg/mL    Comment: . Interpretive Guide    Intact PTH           Calcium ------------------     ----------           ------- Normal Parathyroid    Normal               Normal Hypoparathyroidism    Low or Low Normal    Low Hyperparathyroidism    Primary            Normal or High       High    Secondary          High                 Normal or Low    Tertiary           High                 High Non-Parathyroid    Hypercalcemia      Low or Low Normal    High .    Calcium 9.6 8.6 - 10.4 mg/dL   Calcium, Ion 5.1 4.7 - 5.5 mg/dL  C-reactive protein     Status: None   Collection Time: 07/30/24  2:54 PM  Result Value Ref Range   CRP <0.5 0.5 - 20.0 mg/dL  Hepatitis C Antibody     Status: None   Collection Time: 07/30/24  2:54 PM  Result Value Ref Range   Hepatitis C Ab NON-REACTIVE NON-REACTIVE    Comment: . HCV antibody was non-reactive. There is no laboratory  evidence of HCV infection. . In most cases, no further action is required. However, if recent HCV exposure is suspected, a test for HCV RNA (test code 64354) is suggested. . For additional information please refer to http://education.[facility].com/faq/FAQ22v1 (This link is being provided for informational/ educational purposes only.) .   Hepatitis B core antibody, total     Status: None   Collection Time: 07/30/24  2:54 PM  Result Value Ref Range   Hep B Core Total Ab NON-REACTIVE NON-REACTIVE    Comment: . For additional information, please refer to  http://education.[facility].com/faq/FAQ202  (This link is being provided for informational/ educational purposes only.) .   Hepatitis B surface antibody,qualitative  Status: None   Collection Time: 07/30/24  2:54 PM  Result Value Ref Range   Hep B S Ab NON-REACTIVE NON-REACTIVE  Hepatitis B surface antigen     Status: None   Collection Time: 07/30/24  2:54 PM  Result Value Ref Range   Hepatitis B Surface Ag NON-REACTIVE NON-REACTIVE    Comment: . For additional information, please refer to  http://education.[facility].com/faq/FAQ202   (This link is being provided for informational/ educational purposes only.) .   Sedimentation rate     Status: None   Collection Time: 08/04/24  3:43 PM  Result Value Ref Range   Sed Rate 21 0 - 30 mm/hr  Hemochromatosis DNA, PCR     Status: None   Collection Time: 08/11/24  8:36 AM  Result Value Ref Range   DNA Mutation Analysis Comment     Comment: (NOTE) Result: c.845G>A (p.Cys282Tyr) - Not Detected c.187C>G (p.His63Asp) - Not Detected c.193A>T (p.Ser65Cys) - Not Detected Not associated with increased risk to develop clinical symptoms of Hereditary Hemochromatosis. In symptomatic individuals, other causes of iron overload should be evaluated. See Additional Information and Comments. Additional Clinical Information: Hereditary hemochromatosis (HFE related) is an autosomal recessive iron storage disorder. Patients may have a genetic diagnosis of hereditary hemochromatosis and never show clinical symptoms. Clinical symptoms typically appear between 40 to 60 years in males and after menopause in females. Signs and symptoms may include organ damage, primarily in the liver, risk for hepatocellular carcinoma, diabetes, and heart disease due to iron accumulation. Life expectancy may be decreased in individuals who develop cirrhosis. Treatment for clinically symptomatic individuals may include therapeutic phlebotomy. L iver transplant may be used to treat end stage liver failure. For preventive care, monitoring for iron overload is recommended for patients who are homozygous for c.845G>A (p.Cys282Tyr) and have yet to experience clinical symptoms. Comments: The most common HFE variants associated with hereditary hemochromatosis are c.845G>A (p.Cys282Tyr), c.187C>G (p.His63Asp), c.193A>T (p.Ser65Cys). While patients homozygous for c.845G>A (p.Cys282Tyr) are the most likely to present clinical symptoms, less than 10% develop clinically significant iron overload with tissue and organ  damage. Genetic counseling is recommended to discuss the potential clinical implications of positive results, as well as recommendations for testing family members. Genetic Coordinators are available for health care providers to discuss results at [phone]). Test Details: Three variants analyzed: c.845G>A (p.Cys282Tyr), commonly referred to as C282Y c.187C>G (p.His63Asp), commonly referred to a s H63D c.193A>T (p.Ser65Cys), commonly referred to as S65C Methods/Limitations: DNA Analysis of the HFE gene (NM_[id number]) was performed by PCR amplification followed by restriction enzyme digestion analyses. Results must be combined with clinical information for the most accurate interpretation. Molecular-based testing is highly accurate, but as in any laboratory test, diagnostic errors may occur. False positive or false negative results may occur for reasons that include genetic variants, blood transfusions, bone marrow transplantation, somatic or tissue-specific mosaicism, mislabeled samples, or erroneous representation of family relationships. This test was developed and its performance characteristics determined by [facility]. It has not been cleared or approved by the Food and Drug Administration. References: Aldona CAVES, [address] Kowdley SONNA Monte LW, [place] AS; American Association for the Study of Liver Diseases. Diagnosis and management of hemochromatosis: 2011  practice guideline by the American Association for the Study of Liver Diseases. Hepatology. 2011 Jul;54(1):328-43. doi: 10.1002/hep.24330. PMID: 78547709; PMCID: EFR6850874. [address] Brissot P, Swinkels DW, Zoller H, Kamarainen O, Patton S, Alonso I, Morris M, Keeney S. EMQN best practice guidelines for the molecular genetic diagnosis of hereditary hemochromatosis [facility]).  Eur J Hum Genet. 2016 Apr;24(4):479-95. doi: 10.1038/ejhg.2015.128. Epub 2015 Jul 8. PMID: 73846781; PMCID: EFR5070138.    Reviewed by: Comment      Comment: (NOTE) Technical Component performed at [facility] Professional Component performed by: Fairy WENDI Aid, PhD, [facility] [id number], [facility], [address] [place] [address] Performed At: [facility] [address] [place], [place] [id number] Loran Gales MDPhD Ey:[id number]   Lactate dehydrogenase (LDH)     Status: None   Collection Time: 08/11/24  8:36 AM  Result Value Ref Range   LDH 168 98 - 192 U/L    Comment: Performed at [facility], [address] [place], [place] [address]  Iron and Iron Binding Capacity ([facility] only)     Status: None   Collection Time: 08/11/24  8:36 AM  Result Value Ref Range   Iron 70 28 - 170 ug/dL   TIBC 695 749 - 549 ug/dL   Saturation Ratios 23 10.4 - 31.8 %   UIBC 234 ug/dL    Comment: Performed at [facility], [address], [place], [place] [address]  Ferritin     Status: Abnormal   Collection Time: 08/11/24  8:36 AM  Result Value Ref Range   Ferritin 668 (H) 11 - 307 ng/mL    Comment: Performed at [facility], [address] [place], [place] [address]  CMP (Cancer Center only)     Status: None   Collection Time: 08/11/24  8:36 AM  Result Value Ref Range   Sodium 142 135 - 145 mmol/L   Potassium 4.0 3.5 - 5.1 mmol/L   Chloride 103 98 - 111 mmol/L   CO2 27 22 - 32 mmol/L   Glucose, Bld 88 70 - 99 mg/dL    Comment: Glucose reference range applies only to samples taken after fasting for at least 8 hours.   BUN 11 8 - 23 mg/dL   Creatinine 9.25 9.55 - 1.00 mg/dL   Calcium 9.6 8.9 - 89.6 mg/dL   Total Protein 7.7 6.5 - 8.1 g/dL   Albumin 4.7 3.5 - 5.0 g/dL   AST 22 15 - 41 U/L   ALT 16 0 - 44 U/L   Alkaline Phosphatase 70 38 - 126 U/L   Total Bilirubin 0.3 0.0 - 1.2 mg/dL   GFR, Estimated >39 >39 mL/min    Comment: (NOTE) Calculated using the CKD-EPI Creatinine Equation (2021)    Anion gap 11 5 - 15    Comment: Performed at [facility], [address] [place], [place] [address]  CBC with Differential (Cancer Center Only)     Status: Abnormal   Collection Time: 08/11/24  8:36 AM  Result Value Ref Range   WBC Count 4.4 4.0 - 10.5 K/uL   RBC 4.48 3.87 - 5.11 MIL/uL   Hemoglobin 13.0 12.0 - 15.0 g/dL   HCT 59.3 63.9 - 53.9 %   MCV 90.6 80.0 - 100.0 fL   MCH 29.0 26.0 - 34.0 pg   MCHC 32.0 30.0 - 36.0 g/dL   RDW 86.7 88.4 - 84.4 %   Platelet Count 228 150 - 400 K/uL   nRBC 0.0 0.0 - 0.2 %   Neutrophils Relative % 36 %   Neutro Abs 1.6 (L) 1.7 - 7.7 K/uL   Lymphocytes Relative 54 %   Lymphs Abs 2.3 0.7 - 4.0 K/uL   Monocytes Relative 8 %   Monocytes Absolute 0.4 0.1 - 1.0 K/uL  Eosinophils Relative 1 %   Eosinophils Absolute 0.1 0.0 - 0.5 K/uL   Basophils Relative 1 %   Basophils Absolute 0.1 0.0 - 0.1 K/uL   Immature Granulocytes 0 %   Abs Immature Granulocytes 0.00 0.00 - 0.07 K/uL    Comment: Performed at [facility], [address] [place], [place] [address]        Beverley KATHEE Hummer, MD  I,Emily Lagle,acting as a scribe for Beverley KATHEE Hummer, MD.,have documented all relevant documentation on the behalf of Beverley KATHEE Hummer, MD.  LILLETTE Beverley KATHEE Hummer, MD, have reviewed all documentation for this visit. The documentation on 10/02/2024 for the exam, diagnosis, procedures, and orders are all accurate and complete.

## 2024-10-08 ENCOUNTER — Ambulatory Visit: Payer: Self-pay | Admitting: Family Medicine

## 2024-10-08 DIAGNOSIS — M1711 Unilateral primary osteoarthritis, right knee: Secondary | ICD-10-CM

## 2024-10-10 NOTE — Addendum Note (Signed)
 Addended by: SEBASTIAN RIGHTER B on: 10/10/2024 01:31 PM   Modules accepted: Orders

## 2024-10-10 NOTE — Telephone Encounter (Signed)
 We can do physical therapy. Referral placed.

## 2024-11-05 ENCOUNTER — Emergency Department (HOSPITAL_COMMUNITY)
Admission: EM | Admit: 2024-11-05 | Discharge: 2024-11-05 | Disposition: A | Discharge status: Home / Self Care | Source: Ambulatory Visit | Attending: Emergency Medicine | Admitting: Emergency Medicine

## 2024-11-05 ENCOUNTER — Encounter (HOSPITAL_COMMUNITY): Payer: Self-pay

## 2024-11-05 ENCOUNTER — Emergency Department (HOSPITAL_COMMUNITY)

## 2024-11-05 ENCOUNTER — Other Ambulatory Visit: Payer: Self-pay

## 2024-11-05 DIAGNOSIS — R42 Dizziness and giddiness: Secondary | ICD-10-CM | POA: Diagnosis present

## 2024-11-05 DIAGNOSIS — I160 Hypertensive urgency: Secondary | ICD-10-CM | POA: Diagnosis not present

## 2024-11-05 LAB — URINALYSIS, ROUTINE W REFLEX MICROSCOPIC
Bacteria, UA: NONE SEEN
Bilirubin Urine: NEGATIVE
Glucose, UA: NEGATIVE mg/dL
Hgb urine dipstick: NEGATIVE
Ketones, ur: NEGATIVE mg/dL
Nitrite: NEGATIVE
Protein, ur: NEGATIVE mg/dL
Specific Gravity, Urine: 1.013 (ref 1.005–1.030)
pH: 6 (ref 5.0–8.0)

## 2024-11-05 LAB — COMPREHENSIVE METABOLIC PANEL WITH GFR
ALT: 23 U/L (ref 0–44)
AST: 24 U/L (ref 15–41)
Albumin: 4.7 g/dL (ref 3.5–5.0)
Alkaline Phosphatase: 79 U/L (ref 38–126)
Anion gap: 10 (ref 5–15)
BUN: 10 mg/dL (ref 8–23)
CO2: 30 mmol/L (ref 22–32)
Calcium: 10.2 mg/dL (ref 8.9–10.3)
Chloride: 102 mmol/L (ref 98–111)
Creatinine, Ser: 0.71 mg/dL (ref 0.44–1.00)
GFR, Estimated: >60 mL/min
Glucose, Bld: 88 mg/dL (ref 70–99)
Potassium: 4.5 mmol/L (ref 3.5–5.1)
Sodium: 142 mmol/L (ref 135–145)
Total Bilirubin: 0.5 mg/dL (ref 0.0–1.2)
Total Protein: 8.2 g/dL — ABNORMAL HIGH (ref 6.5–8.1)

## 2024-11-05 LAB — CBC
HCT: 43.3 % (ref 36.0–46.0)
Hemoglobin: 14.2 g/dL (ref 12.0–15.0)
MCH: 29.7 pg (ref 26.0–34.0)
MCHC: 32.8 g/dL (ref 30.0–36.0)
MCV: 90.6 fL (ref 80.0–100.0)
Platelets: 256 K/uL (ref 150–400)
RBC: 4.78 MIL/uL (ref 3.87–5.11)
RDW: 13.5 % (ref 11.5–15.5)
WBC: 7.1 K/uL (ref 4.0–10.5)
nRBC: 0 % (ref 0.0–0.2)

## 2024-11-05 MED ORDER — CHLORTHALIDONE 25 MG PO TABS: 25.0000 mg | ORAL_TABLET | Freq: Every day | ORAL | 0 refills | Status: AC

## 2024-11-05 NOTE — ED Triage Notes (Signed)
 Patient states she was had a spell at [facility] today. She became dizzy and felt like she was going to pass out. Patient denies pain. Tingling in her arm, states she thinks she has a pinched nerve. Has been having tingling for about one month. EMS was called to [facility] and patient was assessed and told to go to ED. BP 180/108 and 220/108 at [facility]. CBG 77

## 2024-11-05 NOTE — Discharge Instructions (Signed)
Be sure to follow-up with your physician.  Return here for concerning changes in your condition. 

## 2024-11-05 NOTE — ED Provider Triage Note (Signed)
 Emergency Medicine Provider Triage Evaluation Note  Nannie Starzyk , a 64 y.o. female  was evaluated in triage.  Pt complains of near syncope that occurred around 130 today at [facility].  Dizziness described as room spinning.  Was not confused.  Did not have any difficulty speaking or walking.  Did have some left upper extremity numbness and tingling.  Although she reports that she has been having left upper extremity numbness and tingling intermittently for the past month.  Had no chest pain or shortness of breath.  Evaluated by EMS and noted to be hypertensive.  Is not on any hypertensive medications.  Review of Systems  Positive:  Negative:   Physical Exam  BP (!) 167/98 (BP Location: Left Arm)   Pulse 72   Temp 98.1 F (36.7 C) (Oral)   Resp 18   Ht 5' 4 (1.626 m)   Wt 81.6 kg   SpO2 100%   BMI 30.90 kg/m  Gen:   Awake, no distress   Resp:  Normal effort  MSK:   Moves extremities without difficulty  Other:  No gross neurodeficits  Medical Decision Making  Medically screening exam initiated at 7:21 PM.  Appropriate orders placed.  Nicci Vaughan was informed that the remainder of the evaluation will be completed by another provider, this initial triage assessment does not replace that evaluation, and the importance of remaining in the ED until their evaluation is complete.  Begin workup   Donnajean Lynwood VEAR DEVONNA 11/05/24 [id number]

## 2024-11-05 NOTE — ED Provider Notes (Signed)
 " [place] EMERGENCY DEPARTMENT AT [facility] Provider Note   CSN: [id number] Arrival date & time: 11/05/24  1418     Patient presents with: Hypertension   Sabrina Gallagher is a 64 y.o. female.   HPI Patient presents with her husband.  She presents with concern for hypertension, lightheadedness. She notes no history of hypertension, takes no medication regularly. Was about 1 month ago after a sports injury she has been active, she notes that in the last 6 weeks or so she has been generally inactive, has been eating a healthy diet, but has been otherwise unremarkable.  Today, after standing up suddenly she felt lightheaded, dizzy, sat down and EMS reports that on checking her blood pressure it was very low, with readings as high as 200 systolic. She was sent here for evaluation.  No chest pain, no back pain, no dyspnea, no syncope. She has had left arm tingling for about 1 month, this is unchanged, without weakness.    Prior to Admission medications  Medication Sig Start Date End Date Taking? Authorizing Provider  chlorthalidone  (HYGROTON ) 25 MG tablet Take 1 tablet (25 mg total) by mouth daily. 11/05/24  Yes Garrick Charleston, MD  B Complex Vitamins (B COMPLEX PO) daily.    [provider]  estradiol  (ESTRACE  VAGINAL) 0.1 MG/GM vaginal cream Rub pea size amount each night for 3 weeks then 3 times a week thereafter. 07/24/24   Glennon Almarie POUR, MD  Ferrous Sulfate (IRON PO) daily. Patient not taking: Reported on 08/20/2024    [provider]  meloxicam  (MOBIC ) 15 MG tablet Take 1 tablet (15 mg total) by mouth daily. 10/02/24   Sebastian Beverley NOVAK, MD  naproxen  (NAPROSYN ) 500 MG tablet Take 1 tablet (500 mg total) by mouth 2 (two) times daily with a meal. 08/20/24   McElwee, Lauren A, NP  VITAMIN D  PO Take 5,000 Int'l Units by mouth daily.    [provider]    Allergies: Patient has no known allergies.    Review of Systems  Updated Vital Signs BP  (!) 164/101 (BP Location: Right Arm)   Pulse 70   Temp 98.1 F (36.7 C) (Oral)   Resp 16   Ht 1.626 m (5' 4)   Wt 81.6 kg   SpO2 100%   BMI 30.90 kg/m   Physical Exam Vitals and nursing note reviewed.  Constitutional:      General: She is not in acute distress.    Appearance: She is well-developed.  HENT:     Head: Normocephalic and atraumatic.  Eyes:     Conjunctiva/sclera: Conjunctivae normal.  Cardiovascular:     Rate and Rhythm: Normal rate and regular rhythm.  Pulmonary:     Effort: Pulmonary effort is normal. No respiratory distress.     Breath sounds: Normal breath sounds. No stridor.  Abdominal:     General: There is no distension.  Skin:    General: Skin is warm and dry.  Neurological:     Mental Status: She is alert and oriented to person, place, and time.     Cranial Nerves: No cranial nerve deficit.     Motor: No weakness, tremor, atrophy, abnormal muscle tone or seizure activity.     Coordination: Coordination is intact.  Psychiatric:        Mood and Affect: Mood normal.     (all labs ordered are listed, but only abnormal results are displayed) Labs Reviewed  COMPREHENSIVE METABOLIC PANEL WITH GFR - Abnormal; Notable  for the following components:      Result Value   Total Protein 8.2 (*)    All other components within normal limits  URINALYSIS, ROUTINE W REFLEX MICROSCOPIC - Abnormal; Notable for the following components:   Leukocytes,Ua SMALL (*)    All other components within normal limits  CBC    EKG: EKG Interpretation Date/Time:  Wednesday November 05 2024 20:12:41 EST Ventricular Rate:  65 PR Interval:  144 QRS Duration:  72 QT Interval:  398 QTC Calculation: 413 R Axis:   72  Text Interpretation: Normal sinus rhythm Nonspecific T wave abnormality Abnormal ECG Confirmed by Garrick Charleston [phone]) on 11/05/2024 8:56:07 PM  Radiology: CT Head Wo Contrast Result Date: 11/05/2024 CLINICAL DATA:  Dizziness with left upper extremity numbness  EXAM: CT HEAD WITHOUT CONTRAST TECHNIQUE: Contiguous axial images were obtained from the base of the skull through the vertex without intravenous contrast. RADIATION DOSE REDUCTION: This exam was performed according to the departmental dose-optimization program which includes automated exposure control, adjustment of the mA and/or kV according to patient size and/or use of iterative reconstruction technique. COMPARISON:  CT brain 06/27/2022 FINDINGS: Brain: No evidence of acute infarction, hemorrhage, hydrocephalus, extra-axial collection or mass lesion/mass effect. Vascular: No hyperdense vessel or unexpected calcification. Skull: Normal. Negative for fracture or focal lesion. Sinuses/Orbits: No acute finding. Other: None IMPRESSION: Negative non contrasted CT appearance of the brain. Electronically Signed   By: Luke Bun M.D.   On: 11/05/2024 19:48     Procedures   Medications Ordered in the ED - No data to display                                  Medical Decision Making Adult female presents with new evidence for hypertension in the context of 1 episode of lightheadedness. Patient is awake, alert, neurological exam is unremarkable, presence of left arm tingling will before episode of hypertension, absence of anterior or posterior pain reassuring for low suspicion of dissection.  Concern for hypertensive urgency versus renal dysfunction versus infection structural cause.   Amount and/or Complexity of Data Reviewed Independent Historian: spouse External Data Reviewed: notes.    Details: I evaluated the patient for years ago Labs:  Decision-making details documented in ED Course. Radiology: independent interpretation performed. Decision-making details documented in ED Course. ECG/medicine tests: independent interpretation performed. Decision-making details documented in ED Course.  Risk Decision regarding hospitalization.   10:41 PM On repeat exam patient in no distress.  We had a  lengthy conversation about all findings, labs unremarkable, EKG nonischemic, head CT without acute changes, she has been monitored for hours, blood pressure now 150/88. We had a lengthy conversation about hypertensive urgency versus emergency, today's presentation, need for follow-up, lifestyle modification/maintenance.  We discussed initiation of medication given blood pressures with diastolic greater than 110 for much of her evaluation, consideration of this being the case for her recently, patient will have prescription waiting for her, but will follow-up with primary care.  Without evidence for ACS, endorgan damage, or other acute abnormalities patient will be discharged in stable condition.     Final diagnoses:  Hypertensive urgency    ED Discharge Orders          Ordered    chlorthalidone  (HYGROTON ) 25 MG tablet  Daily        11/05/24 2241               Garrick,  Lamar, MD 11/05/24 2241  "

## 2024-11-07 ENCOUNTER — Ambulatory Visit: Admitting: Family Medicine

## 2024-11-07 VITALS — BP 130/88 | HR 73 | Temp 97.7°F | Ht 64.0 in | Wt 180.0 lb

## 2024-11-07 DIAGNOSIS — I10 Essential (primary) hypertension: Secondary | ICD-10-CM | POA: Diagnosis not present

## 2024-11-07 DIAGNOSIS — R9431 Abnormal electrocardiogram [ECG] [EKG]: Secondary | ICD-10-CM | POA: Diagnosis not present

## 2024-11-07 DIAGNOSIS — R42 Dizziness and giddiness: Secondary | ICD-10-CM

## 2024-11-07 DIAGNOSIS — I270 Primary pulmonary hypertension: Secondary | ICD-10-CM

## 2024-11-07 LAB — TSH: TSH: 1.56 u[IU]/mL (ref 0.35–5.50)

## 2024-11-07 NOTE — Progress Notes (Signed)
 " Assessment & Plan   Assessment/Plan:   Assessment and Plan Assessment & Plan Hypertensive, Primary  Dizziness  Abnormal ECG Presented with hypertensive urgency and lightheadedness. Blood pressure was 164/101 mmHg in the office. Previous readings included 220/108 mmHg and 180/108 mmHg. Symptoms included dizziness, lightheadedness, and a warm sensation, particularly in the left arm. No chest pain or headaches reported. Lab work, including CMP, CBC, and urinalysis, were normal. EKG showed nonspecific T wave abnormalities, unchanged from prior EKGs. Differential diagnosis includes dehydration, vertigo, or stress-related hypertension. Discussed the dynamic nature of blood pressure and the importance of monitoring over time. Emphasized the importance of proper technique and equipment for accurate home blood pressure monitoring.Due to dizziness and nonspecific T wave abnormalities on EKG, further cardiac evaluation is warranted. Discussed the importance of ruling out cardiac causes for symptoms. Explained the role of echocardiogram and Zio patch in assessing heart structure and rhythm. Discussed the potential for thyroid  dysfunction to affect blood pressure and the plan to test thyroid  function. - Monitor blood pressure at home using an approved cuff with proper technique. - Hold off on starting chlorthalidone . - Encouraged lifestyle modifications including diet, exercise, and stress management. - Ordered echocardiogram to assess heart structure and function. - Ordered Zio patch for continuous heart rhythm monitoring. - Referred to cardiology for further evaluation. - Ordered TSH test to evaluate thyroid  function.        There are no discontinued medications.          Subjective:   Encounter date: 11/07/2024  Sabrina Gallagher is a 64 y.o. female who has OVARIAN FAILURE, PREMATURE; Moderate mixed hyperlipidemia not requiring statin therapy; WEIGHT GAIN; HEARTBURN; Premature ovarian  failure; Reflux; Small fiber neuropathy; B12 deficiency; Pre-diabetes; Paresthesias; Muscle pain; Cervical myofascial pain syndrome; Urinary incontinence, mixed; Vaginal atrophy; Constipation; Pelvic organ prolapse quantification stage 1 cystocele; History of UTI; Overweight (BMI 25.0-29.9); Age-related osteoporosis with current pathological fracture; Elevated ferritin; Metabolic dysfunction-associated steatotic liver disease (MASLD); History of prediabetes; Abnormal ECG; Primary hypertension; and Dizziness on their problem list..   She  has a past medical history of Abnormal EKG, Fatigue, GERD (gastroesophageal reflux disease) (2020), Osteoporosis (11/2017), Pap smear of cervix with ASCUS, cannot exclude HGSIL (08/2015), Premature ovarian failure (age 11), and SOB (shortness of breath)..   She presents with chief complaint of Follow-up (Patient wants to discuss medication they prescribed and wants to make sure you are in agreement due to not being able to regulate blood pressure. ) .   Discussed the use of AI scribe software for clinical note transcription with the patient, who gave verbal consent to proceed.  History of Present Illness Sabrina Gallagher is a 64 year old female who presents with hypertensive urgency and associated lightheadedness.  Hypertensive urgency and blood pressure fluctuations - Sudden onset of hypertensive urgency with blood pressure readings ranging from 107/40 mmHg to 220/108 mmHg, and later 180/108 mmHg during EMS evaluation. - Blood pressure in the emergency department recorded at 164/101 mmHg. - Blood pressure readings have continued to fluctuate, with some readings extremely elevated. - No chest pain or headaches. - Chlorthalidone  25 mg prescribed but not yet initiated. - No regular medications other than vitamins.  Dizziness and neurological symptoms - Acute episode of dizziness while reaching for vitamins, described as room spinning and nearly falling. - Warm  sensation throughout the body, with left arm tingling during the episode. - No prior history of similar symptoms or syncope.  Cardiac and laboratory evaluation - EKG demonstrated sinus rhythm with  nonspecific T wave abnormalities, unchanged from previous EKGs. - Comprehensive metabolic panel, complete blood count, and urinalysis were grossly normal.  Lifestyle factors - Recent intake of a caffeinated strawberry acai refresher, which is atypical as she usually consumes herbal tea. - Not currently engaging in regular physical activity. - Dietary lapses during the holidays. 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     ROS  Past Surgical History:  Procedure Laterality Date   No surgical history     TUBAL LIGATION  1985    Outpatient Medications Prior to Visit  Medication Sig Dispense Refill   B Complex Vitamins (B COMPLEX PO) daily.     chlorthalidone  (HYGROTON ) 25 MG tablet Take 1 tablet (25 mg total) by mouth daily. 30 tablet 0   estradiol  (ESTRACE  VAGINAL) 0.1 MG/GM vaginal cream Rub pea size amount each night for 3 weeks then 3 times a week thereafter. 42.5 g 12   meloxicam  (MOBIC ) 15 MG tablet Take 1 tablet (15 mg total) by mouth daily. 30 tablet 1   naproxen  (NAPROSYN ) 500 MG tablet Take 1 tablet (500 mg total) by mouth 2 (two) times daily with a meal. 30 tablet 0   VITAMIN D  PO Take 5,000 Int'l Units by mouth daily.     Ferrous Sulfate (IRON PO) daily. (Patient not taking: Reported on 08/20/2024)     No facility-administered medications prior to visit.    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  Tobacco Use   Smoking status: Never    Passive exposure: Never   Smokeless tobacco: Never  Vaping Use   Vaping status: Never Used  Substance and Sexual Activity   Alcohol use: Yes    Alcohol/week: 1.0 standard drink of alcohol    Types: 1 Glasses of wine per week    Comment: occ   Drug use: No   Sexual activity: Yes    Partners: Male    Birth control/protection: Post-menopausal    Comment: 1st intercourse 64 yo-Fewer than 5 partners  Other Topics Concern   Not on file  Social History Narrative   Right handed   Caffeine use: rare    Social Drivers of Health   Tobacco Use: Low Risk (11/09/2024)   Patient History    Smoking Tobacco Use: Never    Smokeless Tobacco Use: Never    Passive Exposure: Never  Financial Resource Strain: Low Risk (10/02/2024)   Overall Financial Resource Strain (CARDIA)    Difficulty of Paying Living Expenses: Not hard at all  Food Insecurity: No Food Insecurity (10/02/2024)   Epic    Worried About Programme Researcher, Broadcasting/film/video in the Last Year: Never true    Ran Out of Food in the Last Year: Never true  Transportation Needs: No Transportation Needs (10/02/2024)   Epic    Lack of Transportation (Medical): No    Lack of Transportation (Non-Medical): No  Physical Activity: Sufficiently Active (10/02/2024)   Exercise Vital Sign    Days of Exercise per Week: 5 days    Minutes of Exercise per Session: 50 min  Stress: No Stress Concern Present (07/29/2024)   [employer] of Occupational Health - Occupational Stress Questionnaire    Feeling of Stress: Not at all  Social Connections: Socially Integrated (10/02/2024)   Social Connection and Isolation Panel    Frequency of Communication with Friends and Family: More than three times a week    Frequency of Social Gatherings with Friends and Family: Three times a week    Attends Religious Services: More than 4  times per year    Active Member of Clubs or Organizations: Yes    Attends Banker Meetings: More than 4 times per year    Marital Status: Married  Catering Manager Violence: Not At Risk (08/11/2024)   Epic    Fear of Current or Ex-Partner: No    Emotionally Abused: No    Physically Abused: No    Sexually Abused: No  Depression (PHQ2-9): Low Risk (08/11/2024)   Depression (PHQ2-9)    PHQ-2 Score: 0  Alcohol Screen: Low Risk (10/02/2024)   Alcohol Screen    Last Alcohol Screening Score (AUDIT): 1  Housing: Unknown (10/02/2024)   Epic    Unable to Pay for Housing in the Last Year: No    Number of Times Moved in the Last Year: Not on file    Homeless in the Last Year: No  Utilities: Not At Risk (08/11/2024)   Epic    Threatened with loss of utilities: No  Health Literacy: Not on file                                                                                                  Objective:  Physical Exam: BP 130/88   Pulse 73   Temp 97.7 F (36.5 C) (Oral)   Ht 5' 4 (1.626 m)   Wt 180 lb (81.6 kg)   SpO2 99%   BMI 30.90 kg/m    Physical Exam GENERAL: Alert, cooperative, well developed, no acute distress HEENT: Normocephalic, normal oropharynx, moist mucous membranes CHEST: Clear to auscultation bilaterally, no wheezes, rhonchi, or crackles CARDIOVASCULAR: Normal heart rate and rhythm, S1 and S2 normal without murmurs ABDOMEN: Soft, non-tender, non-distended, without organomegaly, normal bowel sounds EXTREMITIES: No cyanosis or edema NEUROLOGICAL: Cranial nerves grossly intact, moves all extremities without gross motor or sensory deficit   Physical Exam  CT Head Wo Contrast Result Date: 11/05/2024 CLINICAL DATA:  Dizziness with left upper extremity numbness EXAM: CT HEAD WITHOUT CONTRAST TECHNIQUE: Contiguous axial images were obtained from the base of the skull through the vertex without intravenous contrast. RADIATION DOSE REDUCTION: This exam was  performed according to the departmental dose-optimization program which includes automated exposure control, adjustment of the mA and/or kV according to patient size and/or use of iterative reconstruction technique. COMPARISON:  CT brain 06/27/2022 FINDINGS: Brain: No evidence of acute infarction, hemorrhage, hydrocephalus, extra-axial collection or mass lesion/mass effect. Vascular: No hyperdense vessel or unexpected calcification. Skull: Normal.  Negative for fracture or focal lesion. Sinuses/Orbits: No acute finding. Other: None IMPRESSION: Negative non contrasted CT appearance of the brain. Electronically Signed   By: Luke Bun M.D.   On: 11/05/2024 19:48   DG Knee Complete 4 Views Right Result Date: 10/08/2024 CLINICAL DATA:  pain EXAM: RIGHT KNEE - COMPLETE 4+ VIEW COMPARISON:  None Available. FINDINGS: No acute fracture or dislocation. Joint spaces are relatively preserved. Minimal osteophyte proliferation along the medial and patellofemoral compartments. Enthesophyte of the quadriceps tendon. No area of erosion or osseous destruction. No unexpected radiopaque foreign body. Soft tissues are unremarkable. IMPRESSION: No acute fracture or dislocation. Electronically Signed   By: Corean Salter M.D.   On: 10/08/2024 08:00   US  ABDOMEN RUQ W/ELASTOGRAPHY Result Date: 08/12/2024 CLINICAL DATA:  Hepatic steatosis.  Increased ferritin EXAM: US  ABDOMEN LIMITED - RIGHT UPPER QUADRANT ULTRASOUND HEPATIC ELASTOGRAPHY TECHNIQUE: Sonography of the right upper quadrant was performed. In addition, ultrasound elastography evaluation of the liver was performed. A region of interest was placed within the right lobe of the liver. Following application of a compressive sonographic pulse, tissue compressibility was assessed. Multiple assessments were performed at the selected site. Median tissue compressibility was determined. Previously, hepatic stiffness was assessed by shear wave velocity. Based on recently  published Society of Radiologists in Ultrasound consensus article, reporting is now recommended to be performed in the SI units of pressure (kiloPascals) representing hepatic stiffness/elasticity. The obtained result is compared to the published reference standards. (cACLD = compensated Advanced Chronic Liver Disease) COMPARISON:  Abdominal ultrasound examination dated 12/16/2020 FINDINGS: ULTRASOUND ABDOMEN LIMITED RIGHT UPPER QUADRANT Gallbladder: No gallstones or wall thickening visualized. No sonographic Murphy sign noted. Common bile duct: Diameter: 4 mm Liver: No focal lesion identified. Within normal limits in parenchymal echogenicity. Portal vein is patent on color Doppler imaging with normal direction of blood flow towards the liver. ULTRASOUND HEPATIC ELASTOGRAPHY Device: Siemens Helix VTQ Patient position: Supine Transducer 5C1 Number of measurements: 10 Hepatic segment:  8 Median kPa: 2.5 IQR: 0.5 IQR/Median kPa ratio: 0.20 Data quality:  Good Diagnostic category:  < or = 5 kPa: high probability of being normal The use of hepatic elastography is applicable to patients with viral hepatitis and non-alcoholic fatty liver disease. At this time, there is insufficient data for the referenced cut-off values and use in other causes of liver disease, including alcoholic liver disease. Patients, however, may be assessed by elastography and serve as their own reference standard/baseline. In patients with non-alcoholic liver disease, the values suggesting compensated advanced chronic liver disease (cACLD) may be lower, and patients may need additional testing with elasticity results of 7-9 kPa. Please note that abnormal hepatic elasticity and shear wave velocities may also be identified in clinical settings other than with hepatic fibrosis, such as: acute hepatitis, elevated right heart and central venous pressures including use of beta blockers, veno-occlusive disease (Budd-Chiari), infiltrative processes such as  mastocytosis/amyloidosis/infiltrative tumor/lymphoma, extrahepatic cholestasis, with hyperemia in the post-prandial state, and with liver transplantation. Correlation with patient history, laboratory data, and clinical condition recommended. Diagnostic Categories: < or =5 kPa: high probability of being normal < or =9 kPa: in the absence of other known clinical signs, rules out cACLD >9 kPa and ?13 kPa: suggestive of cACLD, but needs further testing >13 kPa: highly suggestive of cACLD > or =17 kPa: highly suggestive of cACLD with an increased probability of clinically significant portal hypertension IMPRESSION: ULTRASOUND RUQ: Normal right upper quadrant ultrasound examination. ULTRASOUND HEPATIC ELASTOGRAPHY: Median kPa:  2.5 Diagnostic  category:  < or = 5 kPa: high probability of being normal Electronically Signed   By: Limin  Xu M.D.   On: 08/12/2024 15:18    Recent Results (from the past 2160 hours)  CBC     Status: None   Collection Time: 11/05/24  7:16 PM  Result Value Ref Range   WBC 7.1 4.0 - 10.5 K/uL   RBC 4.78 3.87 - 5.11 MIL/uL   Hemoglobin 14.2 12.0 - 15.0 g/dL   HCT 56.6 63.9 - 53.9 %   MCV 90.6 80.0 - 100.0 fL   MCH 29.7 26.0 - 34.0 pg   MCHC 32.8 30.0 - 36.0 g/dL   RDW 86.4 88.4 - 84.4 %   Platelets 256 150 - 400 K/uL   nRBC 0.0 0.0 - 0.2 %    Comment: Performed at [facility], [address] [place], [place] [address]  Comprehensive metabolic panel     Status: Abnormal   Collection Time: 11/05/24  7:16 PM  Result Value Ref Range   Sodium 142 135 - 145 mmol/L   Potassium 4.5 3.5 - 5.1 mmol/L   Chloride 102 98 - 111 mmol/L   CO2 30 22 - 32 mmol/L   Glucose, Bld 88 70 - 99 mg/dL    Comment: Glucose reference range applies only to samples taken after fasting for at least 8 hours.   BUN 10 8 - 23 mg/dL   Creatinine, Ser 9.28 0.44 - 1.00 mg/dL   Calcium 89.7 8.9 - 89.6 mg/dL   Total Protein 8.2 (H) 6.5 - 8.1 g/dL   Albumin 4.7 3.5 - 5.0 g/dL   AST 24 15  - 41 U/L   ALT 23 0 - 44 U/L   Alkaline Phosphatase 79 38 - 126 U/L   Total Bilirubin 0.5 0.0 - 1.2 mg/dL   GFR, Estimated >39 >39 mL/min    Comment: (NOTE) Calculated using the CKD-EPI Creatinine Equation (2021)    Anion gap 10 5 - 15    Comment: Performed at [facility], [address], [place], [place] [address]  Urinalysis, Routine w reflex microscopic -Urine, Clean Catch     Status: Abnormal   Collection Time: 11/05/24  9:50 PM  Result Value Ref Range   Color, Urine YELLOW YELLOW   APPearance CLEAR CLEAR   Specific Gravity, Urine 1.013 1.005 - 1.030   pH 6.0 5.0 - 8.0   Glucose, UA NEGATIVE NEGATIVE mg/dL   Hgb urine dipstick NEGATIVE NEGATIVE   Bilirubin Urine NEGATIVE NEGATIVE   Ketones, ur NEGATIVE NEGATIVE mg/dL   Protein, ur NEGATIVE NEGATIVE mg/dL   Nitrite NEGATIVE NEGATIVE   Leukocytes,Ua SMALL (A) NEGATIVE   RBC / HPF 0-5 0 - 5 RBC/hpf   WBC, UA 6-10 0 - 5 WBC/hpf   Bacteria, UA NONE SEEN NONE SEEN   Squamous Epithelial / HPF 0-5 0 - 5 /HPF   Mucus PRESENT     Comment: Performed at [facility], [address], [place], [place] [address]  TSH     Status: None   Collection Time: 11/07/24  9:00 AM  Result Value Ref Range   TSH 1.56 0.35 - 5.50 uIU/mL  POCT URINE DIPSTICK     Status: Abnormal   Collection Time: 11/09/24  9:18 AM  Result Value Ref Range   Color, UA yellow yellow   Clarity, UA clear clear   Glucose, UA negative negative mg/dL   Bilirubin, UA negative negative   Ketones,  POC UA negative negative mg/dL   Spec Grav, UA 8.984 8.989 - 1.025   Blood, UA trace-intact (A) negative   pH, UA 7.0 5.0 - 8.0   Protein Ur, POC negative negative mg/dL   Urobilinogen, UA 0.2 0.2 or 1.0 E.U./dL   Nitrite, UA Negative Negative   Leukocytes, UA Negative Negative        Beverley Adine Hummer, MD, MS "

## 2024-11-09 ENCOUNTER — Ambulatory Visit
Admission: EM | Admit: 2024-11-09 | Discharge: 2024-11-09 | Disposition: A | Discharge status: Home / Self Care | Attending: Family Medicine | Admitting: Family Medicine

## 2024-11-09 ENCOUNTER — Encounter: Payer: Self-pay | Admitting: Emergency Medicine

## 2024-11-09 DIAGNOSIS — R350 Frequency of micturition: Secondary | ICD-10-CM | POA: Diagnosis present

## 2024-11-09 DIAGNOSIS — I10 Essential (primary) hypertension: Secondary | ICD-10-CM | POA: Insufficient documentation

## 2024-11-09 DIAGNOSIS — R300 Dysuria: Secondary | ICD-10-CM | POA: Diagnosis present

## 2024-11-09 DIAGNOSIS — R42 Dizziness and giddiness: Secondary | ICD-10-CM | POA: Insufficient documentation

## 2024-11-09 DIAGNOSIS — R9431 Abnormal electrocardiogram [ECG] [EKG]: Secondary | ICD-10-CM | POA: Insufficient documentation

## 2024-11-09 LAB — POCT URINE DIPSTICK
Bilirubin, UA: NEGATIVE
Glucose, UA: NEGATIVE mg/dL
Ketones, POC UA: NEGATIVE mg/dL
Leukocytes, UA: NEGATIVE
Nitrite, UA: NEGATIVE
Protein Ur, POC: NEGATIVE mg/dL
Spec Grav, UA: 1.015
Urobilinogen, UA: 0.2 U/dL
pH, UA: 7

## 2024-11-09 MED ORDER — PHENAZOPYRIDINE HCL 100 MG PO TABS: 100.0000 mg | ORAL_TABLET | Freq: Three times a day (TID) | ORAL | 0 refills | Status: DC | PRN

## 2024-11-09 NOTE — ED Triage Notes (Signed)
 Patient c/o possible UTI, urinary urgency and frequency x 10 days.  Pain in her lower abdominal area.  Denies any hematuria or fever.  No OTC meds.

## 2024-11-09 NOTE — Discharge Instructions (Signed)
 The clinic will contact you with results of the urine culture done today if positive.  Start Pyridium  as needed for your symptoms.  This will make your urine orange.  Follow-up with your PCP if your symptoms do not improve.  Please go to the emergency room for any worsening symptoms.  I hope you feel better soon!

## 2024-11-09 NOTE — ED Provider Notes (Signed)
 " [facility]    CSN: [id number] Arrival date & time: 11/09/24  0851      History   Chief Complaint Chief Complaint  Patient presents with   Urinary Urgency    HPI Sabrina Gallagher is a 64 y.o. female presents for urinary frequency.  Patient reports 10 days of urinary urgency and frequency without dysuria, fevers, N/V, hematuria, odor or flank pain.  No vaginal discharge or STD concern.  She has not taken any OTC treatments for the symptoms.  Of note BP elevated on intake, patient was in the emergency room on 1/7 for elevated BP without history of hypertension.  She has since followed up with her PCP who is holding off on starting medications as she does have normal blood pressure readings at home and is seeing cardiology.  She currently denies any chest pain, shortness of breath or headache.  HPI  Past Medical History:  Diagnosis Date   Abnormal EKG    Fatigue    GERD (gastroesophageal reflux disease) 2020   Osteoporosis 11/2017   T score -3.0 needs bone builder. no fractures. considering options   Pap smear of cervix with ASCUS, cannot exclude HGSIL 08/2015   colposcopy biopsy 12:00 with LGSIL negative ECC   Premature ovarian failure age 89   SOB (shortness of breath)     Patient Active Problem List   Diagnosis Date Noted   Overweight (BMI 25.0-29.9) 07/30/2024   Age-related osteoporosis with current pathological fracture 07/30/2024   Elevated ferritin 07/30/2024   Metabolic dysfunction-associated steatotic liver disease (MASLD) 07/30/2024   History of prediabetes 07/30/2024   Urinary incontinence, mixed 11/28/2023   Vaginal atrophy 11/28/2023   Constipation 11/28/2023   Pelvic organ prolapse quantification stage 1 cystocele 11/28/2023   History of UTI 11/28/2023   Cervical myofascial pain syndrome 01/29/2023   Paresthesias 04/24/2016   Muscle pain 04/24/2016   Small fiber neuropathy 01/31/2016   B12 deficiency 01/31/2016   Pre-diabetes 01/31/2016   Reflux     Premature ovarian failure    OVARIAN FAILURE, PREMATURE 06/23/2010   Moderate mixed hyperlipidemia not requiring statin therapy 06/23/2010   WEIGHT GAIN 06/23/2010   HEARTBURN 06/23/2010    Past Surgical History:  Procedure Laterality Date   No surgical history     TUBAL LIGATION  1985    OB History     Gravida  3   Para  2   Term  2   Preterm      AB  1   Living  2      SAB  1   IAB      Ectopic      Multiple      Live Births  2            Home Medications    Prior to Admission medications  Medication Sig Start Date End Date Taking? Authorizing Provider  B Complex Vitamins (B COMPLEX PO) daily.   Yes [provider]  estradiol  (ESTRACE  VAGINAL) 0.1 MG/GM vaginal cream Rub pea size amount each night for 3 weeks then 3 times a week thereafter. 07/24/24  Yes Glennon Almarie POUR, MD  naproxen  (NAPROSYN ) 500 MG tablet Take 1 tablet (500 mg total) by mouth 2 (two) times daily with a meal. 08/20/24  Yes McElwee, Lauren A, NP  phenazopyridine  (PYRIDIUM ) 100 MG tablet Take 1 tablet (100 mg total) by mouth 3 (three) times daily as needed for pain. 11/09/24  Yes Loreda Myla SAUNDERS, NP  VITAMIN D   PO Take 5,000 Int'l Units by mouth daily.   Yes [provider]  chlorthalidone  (HYGROTON ) 25 MG tablet Take 1 tablet (25 mg total) by mouth daily. 11/05/24   Garrick Charleston, MD  Ferrous Sulfate (IRON PO) daily. Patient not taking: Reported on 08/20/2024    [provider]  meloxicam  (MOBIC ) 15 MG tablet Take 1 tablet (15 mg total) by mouth daily. 10/02/24   Sebastian Beverley NOVAK, MD    Family History Family History  Problem Relation Age of Onset   Cancer Mother        Oral-tongue   Varicose Veins Mother    Colon cancer Father    Cancer Father    Neurofibromatosis Neg Hx    Bladder Cancer Neg Hx    Uterine cancer Neg Hx     Social History Social History[1]   Allergies   Patient has no known allergies.   Review of Systems Review of  Systems  Genitourinary:  Positive for frequency and urgency.     Physical Exam Triage Vital Signs ED Triage Vitals  Encounter Vitals Group     BP 11/09/24 0905 (!) 173/114     Girls Systolic BP Percentile --      Girls Diastolic BP Percentile --      Boys Systolic BP Percentile --      Boys Diastolic BP Percentile --      Pulse Rate 11/09/24 0905 67     Resp 11/09/24 0905 18     Temp 11/09/24 0905 97.8 F (36.6 C)     Temp Source 11/09/24 0905 Oral     SpO2 11/09/24 0905 97 %     Weight 11/09/24 0903 180 lb (81.6 kg)     Height 11/09/24 0903 5' 4 (1.626 m)     Head Circumference --      Peak Flow --      Pain Score 11/09/24 0903 2     Pain Loc --      Pain Education --      Exclude from Growth Chart --    No data found.  Updated Vital Signs BP (!) 173/114 (BP Location: Right Arm)   Pulse 67   Temp 97.8 F (36.6 C) (Oral)   Resp 18   Ht 5' 4 (1.626 m)   Wt 180 lb (81.6 kg)   SpO2 97%   BMI 30.90 kg/m   Visual Acuity Right Eye Distance:   Left Eye Distance:   Bilateral Distance:    Right Eye Near:   Left Eye Near:    Bilateral Near:     Physical Exam Vitals and nursing note reviewed.  Constitutional:      Appearance: Normal appearance.  HENT:     Head: Normocephalic and atraumatic.  Eyes:     Pupils: Pupils are equal, round, and reactive to light.  Cardiovascular:     Rate and Rhythm: Normal rate.  Pulmonary:     Effort: Pulmonary effort is normal.  Abdominal:     Tenderness: There is no right CVA tenderness or left CVA tenderness.  Skin:    General: Skin is warm and dry.  Neurological:     General: No focal deficit present.     Mental Status: She is alert and oriented to person, place, and time.  Psychiatric:        Mood and Affect: Mood normal.        Behavior: Behavior normal.      UC Treatments / Results  Labs (  all labs ordered are listed, but only abnormal results are displayed) Labs Reviewed  POCT URINE DIPSTICK - Abnormal;  Notable for the following components:      Result Value   Blood, UA trace-intact (*)    All other components within normal limits  URINE CULTURE    EKG   Radiology No results found.  Procedures Procedures (including critical care time)  Medications Ordered in UC Medications - No data to display  Initial Impression / Assessment and Plan / UC Course  I have reviewed the triage vital signs and the nursing notes.  Pertinent labs & imaging results that were available during my care of the patient were reviewed by me and considered in my medical decision making (see chart for details).     UA with trace blood otherwise no signs of UTI.  Will send urine culture and start Pyridium  for symptom management while awaiting culture results.  Advised PCP follow-up if symptoms do not improve.  ER precautions reviewed. Final Clinical Impressions(s) / UC Diagnoses   Final diagnoses:  Dysuria  Urinary frequency     Discharge Instructions      The clinic will contact you with results of the urine culture done today if positive.  Start Pyridium  as needed for your symptoms.  This will make your urine orange.  Follow-up with your PCP if your symptoms do not improve.  Please go to the emergency room for any worsening symptoms.  I hope you feel better soon!    ED Prescriptions     Medication Sig Dispense Auth. Provider   phenazopyridine  (PYRIDIUM ) 100 MG tablet Take 1 tablet (100 mg total) by mouth 3 (three) times daily as needed for pain. 10 tablet Nataleigh Griffin, Jodi R, NP      PDMP not reviewed this encounter.    [1]  Social History Tobacco Use   Smoking status: Never    Passive exposure: Never   Smokeless tobacco: Never  Vaping Use   Vaping status: Never Used  Substance Use Topics   Alcohol use: Yes    Alcohol/week: 1.0 standard drink of alcohol    Types: 1 Glasses of wine per week    Comment: occ   Drug use: No     Loreda Myla SAUNDERS, NP 11/09/24 0935  "

## 2024-11-10 ENCOUNTER — Encounter: Payer: Self-pay | Admitting: *Deleted

## 2024-11-10 ENCOUNTER — Ambulatory Visit (HOSPITAL_COMMUNITY): Payer: Self-pay

## 2024-11-10 ENCOUNTER — Ambulatory Visit: Attending: Family Medicine

## 2024-11-10 DIAGNOSIS — I10 Essential (primary) hypertension: Secondary | ICD-10-CM

## 2024-11-10 DIAGNOSIS — R9431 Abnormal electrocardiogram [ECG] [EKG]: Secondary | ICD-10-CM

## 2024-11-10 DIAGNOSIS — R42 Dizziness and giddiness: Secondary | ICD-10-CM

## 2024-11-10 LAB — URINE CULTURE: Culture: NO GROWTH

## 2024-11-10 NOTE — Progress Notes (Unsigned)
 EP to read.

## 2024-11-11 ENCOUNTER — Telehealth: Payer: Self-pay | Admitting: Cardiology

## 2024-11-11 ENCOUNTER — Other Ambulatory Visit: Payer: Self-pay

## 2024-11-11 ENCOUNTER — Emergency Department (HOSPITAL_COMMUNITY)

## 2024-11-11 ENCOUNTER — Encounter (HOSPITAL_COMMUNITY): Payer: Self-pay

## 2024-11-11 ENCOUNTER — Emergency Department (HOSPITAL_COMMUNITY)
Admission: EM | Admit: 2024-11-11 | Discharge: 2024-11-12 | Disposition: A | Discharge status: Home / Self Care | Attending: Emergency Medicine | Admitting: Emergency Medicine

## 2024-11-11 DIAGNOSIS — I10 Essential (primary) hypertension: Secondary | ICD-10-CM | POA: Diagnosis not present

## 2024-11-11 DIAGNOSIS — I159 Secondary hypertension, unspecified: Secondary | ICD-10-CM

## 2024-11-11 DIAGNOSIS — R42 Dizziness and giddiness: Secondary | ICD-10-CM | POA: Insufficient documentation

## 2024-11-11 LAB — CBC WITH DIFFERENTIAL/PLATELET
Abs Immature Granulocytes: 0.01 K/uL (ref 0.00–0.07)
Basophils Absolute: 0.1 K/uL (ref 0.0–0.1)
Basophils Relative: 1 %
Eosinophils Absolute: 0.1 K/uL (ref 0.0–0.5)
Eosinophils Relative: 1 %
HCT: 42.3 % (ref 36.0–46.0)
Hemoglobin: 13.6 g/dL (ref 12.0–15.0)
Immature Granulocytes: 0 %
Lymphocytes Relative: 45 %
Lymphs Abs: 2.6 K/uL (ref 0.7–4.0)
MCH: 29 pg (ref 26.0–34.0)
MCHC: 32.2 g/dL (ref 30.0–36.0)
MCV: 90.2 fL (ref 80.0–100.0)
Monocytes Absolute: 0.6 K/uL (ref 0.1–1.0)
Monocytes Relative: 10 %
Neutro Abs: 2.6 K/uL (ref 1.7–7.7)
Neutrophils Relative %: 43 %
Platelets: 268 K/uL (ref 150–400)
RBC: 4.69 MIL/uL (ref 3.87–5.11)
RDW: 13.4 % (ref 11.5–15.5)
WBC: 5.9 K/uL (ref 4.0–10.5)
nRBC: 0 % (ref 0.0–0.2)

## 2024-11-11 LAB — MAGNESIUM: Magnesium: 2.3 mg/dL (ref 1.7–2.4)

## 2024-11-11 LAB — COMPREHENSIVE METABOLIC PANEL WITH GFR
ALT: 17 U/L (ref 0–44)
AST: 22 U/L (ref 15–41)
Albumin: 4.6 g/dL (ref 3.5–5.0)
Alkaline Phosphatase: 73 U/L (ref 38–126)
Anion gap: 11 (ref 5–15)
BUN: 16 mg/dL (ref 8–23)
CO2: 27 mmol/L (ref 22–32)
Calcium: 9.7 mg/dL (ref 8.9–10.3)
Chloride: 102 mmol/L (ref 98–111)
Creatinine, Ser: 0.8 mg/dL (ref 0.44–1.00)
GFR, Estimated: >60 mL/min
Glucose, Bld: 89 mg/dL (ref 70–99)
Potassium: 3.9 mmol/L (ref 3.5–5.1)
Sodium: 139 mmol/L (ref 135–145)
Total Bilirubin: 0.3 mg/dL (ref 0.0–1.2)
Total Protein: 7.9 g/dL (ref 6.5–8.1)

## 2024-11-11 NOTE — ED Triage Notes (Signed)
 Pt to ED c/o HTN x 2 weeks, reports not on BP medication at this time. Denies blurred vision currently

## 2024-11-11 NOTE — ED Provider Triage Note (Signed)
 Emergency Medicine Provider Triage Evaluation Note  Sabrina Gallagher , a 64 y.o. female  was evaluated in triage.  Pt complains of intermittent high blood pressures and lightheadedness/dizziness for the past week.  States she was seen at [facility] a week ago for high blood pressures.  Since her blood pressures have ranged anywhere from 120-180 systolic.  She was prescribed blood pressure medication in the ED but did not take it.  She saw her primary care doctor and was advised to hold off as her blood pressure was normal at that time.  She does have a cardiology appointment in a couple weeks.  States she has never been on blood pressure medication previously.  Denies chest pain or shortness of breath.  Currently feeling lightheaded.  Review of Systems  Positive: See above Negative: Syncope, chest pain, shortness of breath  Physical Exam  BP (!) 175/101 (BP Location: Right Arm)   Pulse 70   Temp (!) 97.5 F (36.4 C)   Resp 16   SpO2 100%  Gen:   Awake, no distress   Resp:  Normal effort  MSK:   Moves extremities without difficulty  Other:  No acute neurologic deficits  Medical Decision Making  Medically screening exam initiated at 8:22 PM.  Appropriate orders placed.  Sabrina Gallagher was informed that the remainder of the evaluation will be completed by another provider, this initial triage assessment does not replace that evaluation, and the importance of remaining in the ED until their evaluation is complete.     Sabrina Gallagher RAMAN, [place] 11/11/24 2022

## 2024-11-11 NOTE — ED Notes (Signed)
 Pt refusing ordered CT scan, reports had one 2 days ago, ordering provider aware

## 2024-11-11 NOTE — ED Triage Notes (Signed)
 Pt arrive POV to be check for High BP pt reports having blurred vision and dizziness on and off. Not on BP meds at this time appointment with Cardiologist on 11/20/24.

## 2024-11-11 NOTE — Telephone Encounter (Signed)
 Informed patient she would not be billed by our office until services are rendered.  Once she wears her ZIO patch monitor and returns it to [employer] to process, they will bill her insurance for their services.  Once her ZIO report is read by a cardiologist, our office will bill her insurance for the interpretation.

## 2024-11-11 NOTE — Telephone Encounter (Signed)
 Patient came in yesterday by mistake thinking she had an appointment. The appointment that was scheduled was for the Heart Monitor to be delivered to her. Patient stated she signed the registration paperwork and billing paperwork when she arrived. Patient would like to make sure the paper work she signed will not be submitted to her insurance. Patient only wants to be billed the monitor. Please advise.

## 2024-11-12 NOTE — ED Provider Notes (Signed)
 " [place] [facility] Provider Note   CSN: [id number] Arrival date & time: 11/11/24  1958     History Chief Complaint  Patient presents with   Hypertension    HPI Grazia Taffe is a 64 y.o. female presenting for chief complaint of elevated BP. States that she has a history of HTN States she has a prodrome of near syncope and is usually having BP in the 200s.  Elevated Bps noted 2 weeks ago and patient recommended hygroton .  Patient states BP returned to normal  Patient's recorded medical, surgical, social, medication list and allergies were reviewed in the Snapshot window as part of the initial history.   Review of Systems   Review of Systems  Constitutional:  Negative for chills and fever.  HENT:  Negative for ear pain and sore throat.   Eyes:  Negative for pain and visual disturbance.  Respiratory:  Negative for cough and shortness of breath.   Cardiovascular:  Negative for chest pain and palpitations.  Gastrointestinal:  Negative for abdominal pain and vomiting.  Genitourinary:  Negative for dysuria and hematuria.  Musculoskeletal:  Negative for arthralgias and back pain.  Skin:  Negative for color change and rash.  Neurological:  Positive for light-headedness. Negative for seizures and syncope.  All other systems reviewed and are negative.   Physical Exam Updated Vital Signs BP (!) 158/102 (BP Location: Left Arm)   Pulse 60   Temp 98.2 F (36.8 C) (Oral)   Resp 20   Ht 5' 4 (1.626 m)   Wt 81.6 kg   SpO2 100%   BMI 30.90 kg/m  Physical Exam Constitutional:      General: She is not in acute distress.    Appearance: She is not ill-appearing or toxic-appearing.  HENT:     Head: Normocephalic and atraumatic.  Eyes:     Extraocular Movements: Extraocular movements intact.     Pupils: Pupils are equal, round, and reactive to light.  Cardiovascular:     Rate and Rhythm: Normal rate.  Pulmonary:     Effort: No respiratory  distress.  Abdominal:     General: Abdomen is flat.  Musculoskeletal:        General: No swelling, deformity or signs of injury.     Cervical back: Normal range of motion. No rigidity.  Skin:    General: Skin is warm and dry.  Neurological:     General: No focal deficit present.     Mental Status: She is alert and oriented to person, place, and time.  Psychiatric:        Mood and Affect: Mood normal.      ED Course/ Medical Decision Making/ A&P    Procedures Procedures   Medications Ordered in ED Medications - No data to display Medical Decision Making:   Minnah Llamas is a 64 y.o. female who presented to the ED today with light headaedness detailed above.    Patient placed on continuous vitals and telemetry monitoring while in ED which was reviewed periodically.  Complete initial physical exam performed, notably the patient  was hypertensive.    Reviewed and confirmed nursing documentation for past medical history, family history, social history.    Initial Assessment:   With the patient's presentation of elevated blood pressure readings, most likely diagnosis is hypertensive urgency. Other diagnoses associated with hypertensive emergency were considered including (but not limited to) intracranial hemorrhage, acute renal artery stenosis, acute kidney injury, myocardial stress, ophthalmologic emergencies. These  are considered less likely due to history of present illness and physical exam findings.   This is most consistent with an acute life/limb threatening illness complicated by underlying chronic conditions. Will evaluate for hypertensive emergency as below.  Initial Plan:  Screening labs including CBC and Metabolic panel to evaluate for infectious or metabolic etiology of disease.  Urinalysis with reflex culture ordered to evaluate for UTI or relevant urologic/nephrologic pathology.  CXR to evaluate for structural/infectious intrathoracic pathology.  EKG to evaluate for  cardiac pathology Objective evaluation as below reviewed. Considered further administration of antihypertensives in ED, per consensus guidelines for [facility] of emergency physicians, acute treatment of hypertensive urgency alone in the emergency department is not recommended.  If patient has evidence of hypertensive emergency on objective laboratory evaluation, will reevaluate.  Will monitor blood pressure while patient awaiting above laboratory studies.  Initial Study Results:   Laboratory  All laboratory results reviewed without evidence of clinically relevant pathology.    EKG EKG was reviewed independently. Rate, rhythm, axis, intervals all examined and without medically relevant abnormality. ST segments without concerns for elevations.    Radiology:  All images reviewed independently. Agree with radiology report at this time.   CT Head Wo Contrast Result Date: 11/05/2024 CLINICAL DATA:  Dizziness with left upper extremity numbness EXAM: CT HEAD WITHOUT CONTRAST TECHNIQUE: Contiguous axial images were obtained from the base of the skull through the vertex without intravenous contrast. RADIATION DOSE REDUCTION: This exam was performed according to the departmental dose-optimization program which includes automated exposure control, adjustment of the mA and/or kV according to patient size and/or use of iterative reconstruction technique. COMPARISON:  CT brain 06/27/2022 FINDINGS: Brain: No evidence of acute infarction, hemorrhage, hydrocephalus, extra-axial collection or mass lesion/mass effect. Vascular: No hyperdense vessel or unexpected calcification. Skull: Normal. Negative for fracture or focal lesion. Sinuses/Orbits: No acute finding. Other: None IMPRESSION: Negative non contrasted CT appearance of the brain. Electronically Signed   By: Luke Bun M.D.   On: 11/05/2024 19:48    Reassessment and Plan:   Reevaluated patient.  She is asymptomatic at this time.  Blood pressure  spontaneously downtrending but still grossly hypertensive.  Discussed with patient that she has now had approximately 7 readings in the hypertensive range over the last 2 weeks.  She likely meets criteria for initiation of antihypertensive therapy especially given prodromal near syncope. Patient was already prescribed Hygroton  and stated that she would start this in the morning.  Reviewed labs, potassium within normal notes at this time.  Patient to follow-up with PCP within 7 days for repeat lab check, long-term care and management.   Clinical Impression: No diagnosis found.   Data Unavailable   Final Clinical Impression(s) / ED Diagnoses Final diagnoses:  None    Rx / DC Orders ED Discharge Orders     None         Jerral Meth, MD 11/12/24 0104  "

## 2024-11-15 NOTE — Progress Notes (Unsigned)
 "    Cardiology Office Note   Date:  11/19/2024   ID:  Sabrina Gallagher, DOB [birth year], MRN [id number]  PCP:  Sebastian Beverley NOVAK, MD  Cardiologist:   None Referring:  Sebastian Beverley NOVAK, MD  Chief Complaint  Patient presents with   Dizziness      History of Present Illness: Sabrina Gallagher is a 64 y.o. female who presents for evaluation of hypertension and dizziness.   She was seen by cardiology years ago for SOB. She had an echo in 2016 which was normal.  She otherwise has no past cardiac history.  She has had some up-and-down blood pressures over the years but has not needed treatment.  However, she has not been in the emergency room twice.  On the seventh she was at [facility] and a very dizzy.  They had to call EMS.  She did not have syncope.  She was not describing tachypalpitations at that time.  She was not having any chest pressure, neck or arm discomfort or other symptoms.  EMS got there and apparently her blood pressure was very low to begin with and then she got up 200.  I see in the emergency room that the initial reading was 164/101.  Because of the dizziness she had a head CT which was not acute.  Blood work was unremarkable.  I did review these records.  They sent her home on HCTZ.  She saw her primary provider who ordered a monitor which she is almost been wearing for 14 days.  Echo has been ordered.  She was scheduled to see us .  In the interim she had another episode while she was at home.  She had dizziness.  Her blood pressure was 150s systolic.  She felt a pressure in her head.  She felt some vague discomfort in her chest.  She did not have any syncope.  She does not have any palpitations.  She does not have any new PND or orthopnea.  She is otherwise active.  She does have some mild occasional orthostatic symptoms.     Of note in 2023 I see that she was in the emergency room with some facial droop and presyncope.  Head CT at that time was unremarkable.  Code stroke was actually  called.   Past Medical History:  Diagnosis Date   Abnormal EKG    GERD (gastroesophageal reflux disease) 2020   Osteoporosis 11/2017   T score -3.0 needs bone builder. no fractures. considering options   Pap smear of cervix with ASCUS, cannot exclude HGSIL 08/2015   colposcopy biopsy 12:00 with LGSIL negative ECC   Premature ovarian failure age 19    Past Surgical History:  Procedure Laterality Date   No surgical history     TUBAL LIGATION  1985     Current Outpatient Medications  Medication Sig Dispense Refill   B Complex Vitamins (B COMPLEX PO) daily.     chlorthalidone  (HYGROTON ) 25 MG tablet Take 1 tablet (25 mg total) by mouth daily. 30 tablet 0   estradiol  (ESTRACE  VAGINAL) 0.1 MG/GM vaginal cream Rub pea size amount each night for 3 weeks then 3 times a week thereafter. 42.5 g 12   naproxen  (NAPROSYN ) 500 MG tablet Take 1 tablet (500 mg total) by mouth 2 (two) times daily with a meal. 30 tablet 0   phenazopyridine  (PYRIDIUM ) 100 MG tablet Take 1 tablet (100 mg total) by mouth 3 (three) times daily as needed for pain. 10 tablet 0  VITAMIN D  PO Take 5,000 Int'l Units by mouth daily.     No current facility-administered medications for this visit.    Allergies:   Patient has no known allergies.    Social History:  The patient  reports that she has never smoked. She has never been exposed to tobacco smoke. She has never used smokeless tobacco. She reports current alcohol use of about 1.0 standard drink of alcohol per week. She reports that she does not use drugs.   Family History:  The patient's family history includes Cancer in her father and mother; Colon cancer in her father; Varicose Veins in her mother.    ROS:  Please see the history of present illness.   Otherwise, review of systems are positive for none.   All other systems are reviewed and negative.    PHYSICAL EXAM: VS:  BP 123/70   Pulse 65   Ht 5' 4 (1.626 m)   Wt 178 lb 14.4 oz (81.1 kg)   SpO2 99%    BMI 30.71 kg/m  , BMI Body mass index is 30.71 kg/m. GENERAL:  Well appearing HEENT:  Pupils equal round and reactive, fundi not visualized, oral mucosa unremarkable NECK:  No jugular venous distention, waveform within normal limits, carotid upstroke brisk and symmetric, no bruits, no thyromegaly LYMPHATICS:  No cervical, inguinal adenopathy LUNGS:  Clear to auscultation bilaterally BACK:  No CVA tenderness CHEST:  Unremarkable HEART:  PMI not displaced or sustained,S1 and S2 within normal limits, no S3, no S4, no clicks, no rubs, no murmurs ABD:  Flat, positive bowel sounds normal in frequency in pitch, no bruits, no rebound, no guarding, no midline pulsatile mass, no hepatomegaly, no splenomegaly EXT:  2 plus pulses throughout, no edema, no cyanosis no clubbing SKIN:  No rashes no nodules NEURO:  Cranial nerves II through XII grossly intact, motor grossly intact throughout [facility]:  Cognitively intact, oriented to person place and time    EKG:  EKG Interpretation Date/Time:  Wednesday November 19 2024 09:35:16 EST Ventricular Rate:  65 PR Interval:  148 QRS Duration:  86 QT Interval:  390 QTC Calculation: 405 R Axis:   64  Text Interpretation: Normal sinus rhythm T wave abnormality, consider inferior ischemia T wave abnormality, consider anterolateral ischemia When compared with ECG of 11-Nov-2024 20:37, No significant change was found Confirmed by Lavona Rattan ([id number]) on 11/19/2024 9:50:46 AM     Recent Labs: 11/07/2024: TSH 1.56 11/11/2024: ALT 17; BUN 16; Creatinine, Ser 0.80; Hemoglobin 13.6; Magnesium  2.3; Platelets 268; Potassium 3.9; Sodium 139    Lipid Panel    Component Value Date/Time   CHOL 216 (H) 07/24/2024 0847   TRIG 83 07/24/2024 0847   HDL 70 07/24/2024 0847   CHOLHDL 3.1 07/24/2024 0847   VLDL 30 09/19/2016 1609   LDLCALC 128 (H) 07/24/2024 0847      Wt Readings from Last 3 Encounters:  11/19/24 178 lb 14.4 oz (81.1 kg)  11/11/24 180 lb (81.6 kg)   11/09/24 180 lb (81.6 kg)      Other studies Reviewed: Additional studies/ records that were reviewed today include: ED records and primary care records. Review of the above records demonstrates:  Please see elsewhere in the note.     ASSESSMENT AND PLAN:   HTN: For now she can continue the HCTZ.  Pending management will be based on the results below.  Dizziness: Today in the office she was not orthostatic.  I am going to review the results of the  monitor when they are available she is gena let me know because it will not come directly to me.  I did ask her to keep a blood pressure diary at home.  I will look for the results of the echo when available.  If all of this is unrevealing then I would likely not suggest further cardiac workup.  Given the past history in 2023 if cardiac workup is negative it might be prudent to refer her to neurology.  Abnormal EKG: She has had some T wave inversion in inferolateral leads noted on the recent EKGs but going back this was there as well and likely represents more repolarization changes.    Current medicines are reviewed at length with the patient today.  The patient does not have concerns regarding medicines.  The following changes have been made:  no change  Labs/ tests ordered today include:   Orders Placed This Encounter  Procedures   EKG 12-Lead     Disposition:   FU with me after the echo and monitor.      Signed, Lynwood Schilling, MD  11/19/2024 10:39 AM    [facility]    "

## 2024-11-19 ENCOUNTER — Ambulatory Visit: Attending: Cardiology | Admitting: Cardiology

## 2024-11-19 ENCOUNTER — Encounter: Payer: Self-pay | Admitting: Cardiology

## 2024-11-19 VITALS — BP 123/70 | HR 65 | Ht 64.0 in | Wt 178.9 lb

## 2024-11-19 DIAGNOSIS — I10 Essential (primary) hypertension: Secondary | ICD-10-CM

## 2024-11-19 DIAGNOSIS — R42 Dizziness and giddiness: Secondary | ICD-10-CM

## 2024-11-19 DIAGNOSIS — R9431 Abnormal electrocardiogram [ECG] [EKG]: Secondary | ICD-10-CM

## 2024-11-19 NOTE — Patient Instructions (Addendum)
 Medication Instructions:  Your physician recommends that you continue on your current medications as directed. Please refer to the Current Medication list given to you today.  *If you need a refill on your cardiac medications before your next appointment, please call your pharmacy*  Lab Work: NONE If you have labs (blood work) drawn today and your tests are completely normal, you will receive your results only by: MyChart Message (if you have MyChart) OR A paper copy in the mail If you have any lab test that is abnormal or we need to change your treatment, we will call you to review the results.  Testing/Procedures: NONE  Follow-Up: At [facility], you and your health needs are our priority.  As part of our continuing mission to provide you with exceptional heart care, our providers are all part of one team.  This team includes your primary Cardiologist (physician) and Advanced Practice Providers or APPs (Physician Assistants and Nurse Practitioners) who all work together to provide you with the care you need, when you need it.  Your next appointment:   6 weeks  Provider:   Lavona, MD  We recommend signing up for the patient portal called MyChart.  Sign up information is provided on this After Visit Summary.  MyChart is used to connect with patients for Virtual Visits (Telemedicine).  Patients are able to view lab/test results, encounter notes, upcoming appointments, etc.  Non-urgent messages can be sent to your provider as well.   To learn more about what you can do with MyChart, go to [email or web address].   Other Instructions Blood pressure diary: take your blood pressure twice daily for 10 days and send us  the readings via MyChart or drop them off on the 5th floor of our office

## 2024-11-26 ENCOUNTER — Telehealth (HOSPITAL_COMMUNITY): Payer: Self-pay | Admitting: Family Medicine

## 2024-11-26 NOTE — Telephone Encounter (Signed)
 I called to schedule the ordered echocardiogram, however patient did not wish to schedule due to Cardiologist states it is not needed at this time. Order will be removed from the ECHO W. Thank you.

## 2024-12-01 ENCOUNTER — Encounter: Payer: Self-pay | Admitting: Cardiology

## 2024-12-02 ENCOUNTER — Encounter (HOSPITAL_BASED_OUTPATIENT_CLINIC_OR_DEPARTMENT_OTHER): Payer: Self-pay | Admitting: Physical Therapy

## 2024-12-04 ENCOUNTER — Ambulatory Visit (HOSPITAL_BASED_OUTPATIENT_CLINIC_OR_DEPARTMENT_OTHER): Admitting: Physical Therapy

## 2024-12-05 ENCOUNTER — Ambulatory Visit: Admitting: Internal Medicine

## 2024-12-05 ENCOUNTER — Encounter: Payer: Self-pay | Admitting: Internal Medicine

## 2024-12-05 NOTE — Patient Instructions (Signed)
 CONSTIPATION:  -Your constipation is likely due to your medication and dietary changes.  -Increase your fluid intake and dietary fiber by eating more fruits and vegetables.  -Start taking Colace: 1 capsule daily  - Start  Miralax 17 g daily with lots of fluids.  - If your constipation gradually resolved you can use Colace and MiraLAX as needed  - If you are not getting better, contact Dr. Sebastian or Dr. Raquel if there is no improvement.    ESSENTIAL HYPERTENSION: Your blood pressure has been well-controlled with chlorthalidone . -Continue taking chlorthalidone .

## 2024-12-05 NOTE — Progress Notes (Unsigned)
 "  Subjective:    Patient ID: Sabrina Gallagher, female    DOB: 19-Nov-1960, 64 y.o.   MRN: [id number]  DOS:  12/05/2024  Acute  Discussed the use of AI scribe software for clinical note transcription with the patient, who gave verbal consent to proceed.  History of Present Illness Sabrina Gallagher is a 64 year old female with hypertension who presents with constipation.  Constipation began after starting chlorthalidone  for hypertension in January. Bowel movements decreased from daily to every other day with hard, small stools that are difficult to pass. She relates onset to both the new medication and decreased activity and dietary changes over the holidays.  She used an enema on Wednesday with temporary relief. She denies fever, chills, nausea, vomiting, blood in stools, or significant abdominal pain, though she has discomfort from constipation. She notes weight gain rather than loss.  She had a colonoscopy in April 2024 that was normal except for a few polyps. She is taking chlorthalidone , which has significantly lowered her blood pressure. She is awaiting results of a recent Z monitor for heart rhythm and blood pressure evaluation.  Her knee injury has limited exercise at the [facility], and she has regained about 20 pounds after prior weight loss.       Review of Systems See above   Past Medical History:  Diagnosis Date   Abnormal EKG    GERD (gastroesophageal reflux disease) 2020   Osteoporosis 11/2017   T score -3.0 needs bone builder. no fractures. considering options   Pap smear of cervix with ASCUS, cannot exclude HGSIL 08/2015   colposcopy biopsy 12:00 with LGSIL negative ECC   Premature ovarian failure age 63    Past Surgical History:  Procedure Laterality Date   No surgical history     TUBAL LIGATION  1985    Current Outpatient Medications  Medication Instructions   B Complex Vitamins (B COMPLEX PO) Daily   chlorthalidone  (HYGROTON ) 25 mg, Oral, Daily   estradiol   (ESTRACE  VAGINAL) 0.1 MG/GM vaginal cream Rub pea size amount each night for 3 weeks then 3 times a week thereafter.   naproxen  (NAPROSYN ) 500 mg, Oral, 2 times daily with meals   phenazopyridine  (PYRIDIUM ) 100 mg, Oral, 3 times daily PRN   VITAMIN D  PO 5,000 Int'l Units, Daily       Objective:   Physical Exam BP 122/80   Pulse 68   Temp 98.2 F (36.8 C) (Oral)   Resp 16   Ht 5' 4 (1.626 m)   Wt 181 lb 2 oz (82.2 kg)   SpO2 97%   BMI 31.09 kg/m  General:   Well developed, NAD, BMI noted.  HEENT:  Normocephalic . Face symmetric, atraumatic   Abdomen:  Not distended, soft, non-tender. No rebound or rigidity.   Skin: Not pale. Not jaundice Lower extremities: no pretibial edema bilaterally  Neurologic:  alert & oriented X3.  Speech normal, gait appropriate for age and unassisted Psych--  Cognition and judgment appear intact.  Cooperative with normal attention span and concentration.  Behavior appropriate. No anxious or depressed appearing.     Assessment    64 year old female, PCP is Dr. Sebastian.  PMH includes constipation, B12 deficiency, HTN.  Presents with: Assessment and Plan Assessment & Plan Constipation   Started several weeks ago after dietary indiscretions during the holidays, worse since she was prescribed chlorthalidone  for blood pressure. There are no alarming symptoms, up-to-date on colon cancer screening, had a colonoscopy April 2024, polyps seen.  Last TSH normal. Plan: Increase fluid intake and dietary fiber with fruits and vegetables. Start Colace and Miralax daily, adjusting as needed. Contact Dr. Sebastian   if there is no improvement. HTN Was started on chlorthalidone  in January, follow-up BMP satisfactory. This medication is working well for her however it may be partially responsible of constipation.  If GI symptoms continue recommend to discuss with PCP.  Change BP meds?.        "

## 2024-12-25 ENCOUNTER — Ambulatory Visit: Admitting: Cardiology

## 2025-02-09 ENCOUNTER — Inpatient Hospital Stay: Admitting: Family

## 2025-02-09 ENCOUNTER — Inpatient Hospital Stay: Attending: Hematology & Oncology
